# Patient Record
Sex: Female | Born: 1980 | Race: Black or African American | Hispanic: No | Marital: Single | Smoking: Current every day smoker
Health system: Southern US, Community
[De-identification: ages and names within clinical notes are randomized; demographics above are authoritative.]

## PROBLEM LIST (undated history)

## (undated) ENCOUNTER — Inpatient Hospital Stay (HOSPITAL_COMMUNITY): Payer: Self-pay

## (undated) DIAGNOSIS — J42 Unspecified chronic bronchitis: Secondary | ICD-10-CM

## (undated) DIAGNOSIS — M549 Dorsalgia, unspecified: Secondary | ICD-10-CM

## (undated) DIAGNOSIS — H9190 Unspecified hearing loss, unspecified ear: Secondary | ICD-10-CM

## (undated) DIAGNOSIS — R109 Unspecified abdominal pain: Secondary | ICD-10-CM

## (undated) DIAGNOSIS — N760 Acute vaginitis: Secondary | ICD-10-CM

## (undated) DIAGNOSIS — B9689 Other specified bacterial agents as the cause of diseases classified elsewhere: Secondary | ICD-10-CM

## (undated) DIAGNOSIS — O26892 Other specified pregnancy related conditions, second trimester: Secondary | ICD-10-CM

## (undated) HISTORY — PX: NO PAST SURGERIES: SHX2092

## (undated) HISTORY — DX: Unspecified hearing loss, unspecified ear: H91.90

---
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
## 2002-12-08 ENCOUNTER — Inpatient Hospital Stay (HOSPITAL_COMMUNITY): Admission: AD | Admit: 2002-12-08 | Discharge: 2002-12-08 | Payer: Self-pay | Admitting: *Deleted

## 2003-01-08 ENCOUNTER — Inpatient Hospital Stay (HOSPITAL_COMMUNITY): Admission: AD | Admit: 2003-01-08 | Discharge: 2003-01-08 | Payer: Self-pay | Admitting: Obstetrics and Gynecology

## 2003-02-22 ENCOUNTER — Ambulatory Visit (HOSPITAL_COMMUNITY): Admission: RE | Admit: 2003-02-22 | Discharge: 2003-02-22 | Payer: Self-pay | Admitting: *Deleted

## 2003-04-14 ENCOUNTER — Inpatient Hospital Stay (HOSPITAL_COMMUNITY): Admission: AD | Admit: 2003-04-14 | Discharge: 2003-04-14 | Payer: Self-pay | Admitting: Obstetrics & Gynecology

## 2003-06-09 ENCOUNTER — Ambulatory Visit (HOSPITAL_COMMUNITY): Admission: RE | Admit: 2003-06-09 | Discharge: 2003-06-09 | Payer: Self-pay | Admitting: *Deleted

## 2003-06-20 ENCOUNTER — Inpatient Hospital Stay (HOSPITAL_COMMUNITY): Admission: AD | Admit: 2003-06-20 | Discharge: 2003-06-20 | Payer: Self-pay | Admitting: Obstetrics & Gynecology

## 2003-07-22 ENCOUNTER — Inpatient Hospital Stay (HOSPITAL_COMMUNITY): Admission: AD | Admit: 2003-07-22 | Discharge: 2003-07-22 | Payer: Self-pay | Admitting: *Deleted

## 2003-07-23 ENCOUNTER — Inpatient Hospital Stay (HOSPITAL_COMMUNITY): Admission: AD | Admit: 2003-07-23 | Discharge: 2003-07-24 | Payer: Self-pay | Admitting: *Deleted

## 2003-07-25 ENCOUNTER — Ambulatory Visit (HOSPITAL_COMMUNITY): Admission: RE | Admit: 2003-07-25 | Discharge: 2003-07-25 | Payer: Self-pay | Admitting: *Deleted

## 2003-07-27 ENCOUNTER — Inpatient Hospital Stay (HOSPITAL_COMMUNITY): Admission: AD | Admit: 2003-07-27 | Discharge: 2003-07-30 | Payer: Self-pay | Admitting: Obstetrics and Gynecology

## 2004-02-09 ENCOUNTER — Emergency Department (HOSPITAL_COMMUNITY): Admission: EM | Admit: 2004-02-09 | Discharge: 2004-02-09 | Payer: Self-pay | Admitting: Family Medicine

## 2004-08-08 ENCOUNTER — Emergency Department (HOSPITAL_COMMUNITY): Admission: EM | Admit: 2004-08-08 | Discharge: 2004-08-08 | Payer: Self-pay | Admitting: Family Medicine

## 2004-10-08 ENCOUNTER — Inpatient Hospital Stay (HOSPITAL_COMMUNITY): Admission: AD | Admit: 2004-10-08 | Discharge: 2004-10-08 | Payer: Self-pay | Admitting: Obstetrics & Gynecology

## 2004-11-06 IMAGING — US US OB COMP +14 WK
1 series · 13 of 28 positions shown · non-contrast
Comparison: none

FINDINGS
CLINICAL DATA: G1 P0.  LMP 10/16/02.  ASSESS FETAL ANATOMY.
OBSTETRICAL ULTRASOUND
NUMBER OF FETUSES:  1
HEART RATE:  155 bpmMOVEMENT:    YESBREATHING:  NO
PRESENTATION:  CEPHALIC
PLACENTAL LOCATION:  ANTERIORGRADE:  IPREVIA:  NO
AMNIOTIC FLUID (SUBJECTIVE):  NORMAL
AMNIOTIC FLUID (OBJECTIVE):   6.1 cm VERTICAL POCKET
FETAL BIOMETRY
BPD:  4.2 cm18 W 5 D
HC:    15.5 cm18 W 3 D
AC:   13.2 cm18 W 5 D
FL:2.6 cm18 W 0 D
MEAN GA:18 W 3 D
LMP GA:18 W 3 D (ASSIGNED)
FETAL ANATOMY
LATERAL VENTRICLES:VISUALIZED
THALAMI/CSP:     VISUALIZED
POSTERIOR FOSSA:     VISUALIZED
NUCHAL REGION:           VISUALIZED
SPINE:     VISUALIZED
4 CHAMBER HEART ON LEFT:VISUALIZED
STOMACH ON LEFT:VISUALIZED
3 VESSEL CORD:     VISUALIZED
CORD INSERTION SITE:VISUALIZED
KIDNEYS:     VISUALIZED
BLADDER:     VISUALIZED
EXTREMITIES:                        VISUALIZED
ADDITIONAL ANATOMY VISUALIZED:  LVOT, RVOT, UPPER LIP, ORBITS, PROFILE, DIAPHRAGM, HEEL, 5th DIGIT,
DUCTAL ARCH, AORTIC ARCH, MALE GENITALIA.
COMMENT:  THERE IS MILD FETAL RENAL PYELECTASIS WITH THE  RIGHT RENAL PELVIS MEASURING 3.7 MM AND
LEFT RENAL PELVIS MEASURING 4.1 MM.  SUGGEST FOLLOW-UP STUDY LATER IN PREGNANCY.
MATERNAL FINDINGS
CERVIX:    3.3 cm TRANSABDOMINALLY
IMPRESSION
1.  SINGLE LIVING INTRAUTERINE FETUS IN CEPHALIC PRESENTATION.  PATIENT IS 18 WEEKS 3 DAYS BY LMP
DATING AND MEASURES 18 WEEKS 3 DAYS TODAY INDICATING APPROPRIATE GROWTH.
2.  THERE IS MILD FETAL RENAL PYELECTASIS IN THIS MALE FETUS.  SUGGEST FOLLOW-UP STUDY LATER IN
PREGNANCY.  NO OTHER ANATOMIC ABNORMALITIES IDENTIFIED.

[Series 1: unknown · 0.32mm/px · 13 of 64 slices shown]
[im 3/64]
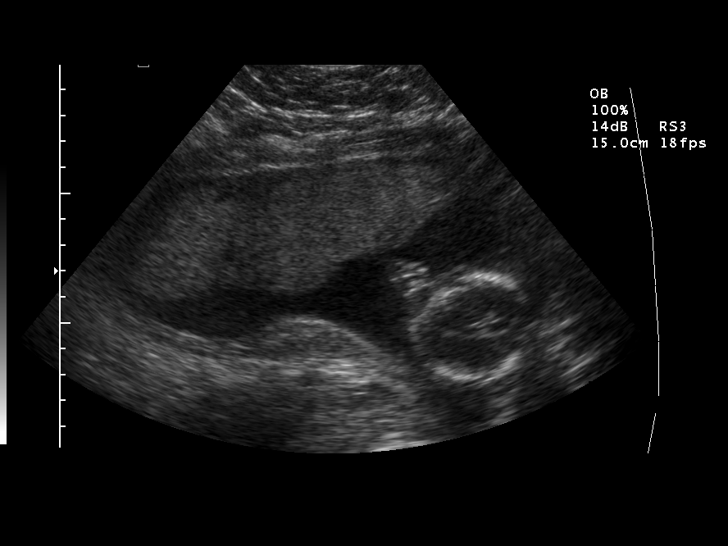
[im 8/64]
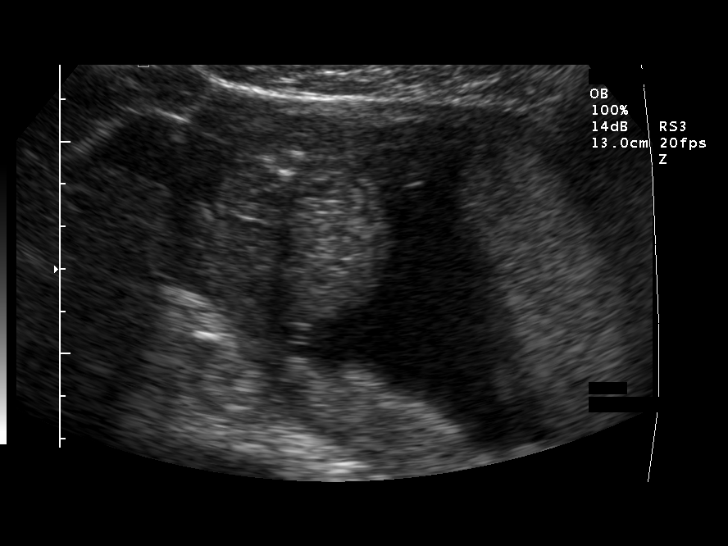
[im 12/64]
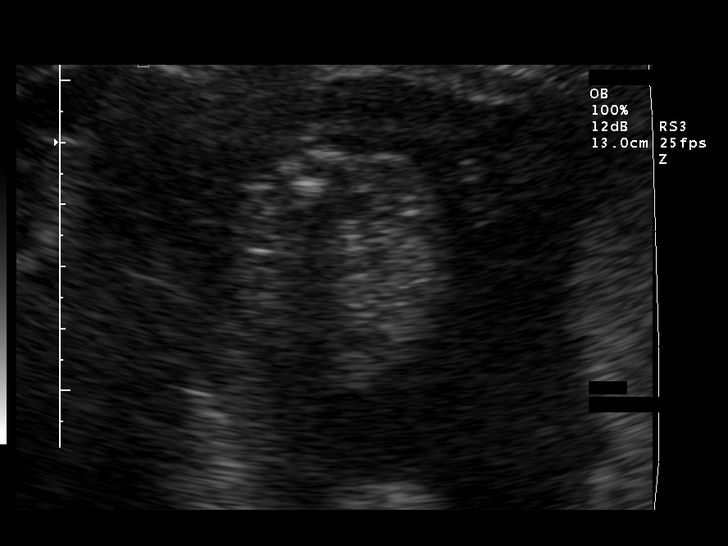
[im 17/64]
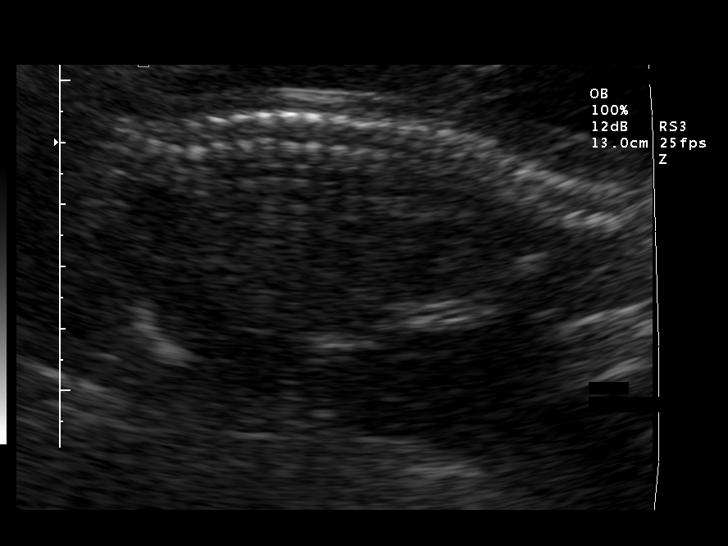
[im 22/64]
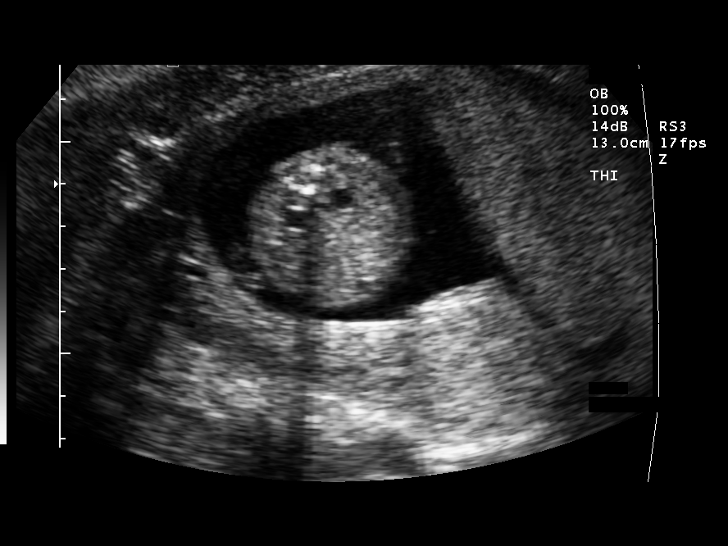
[im 26/64]
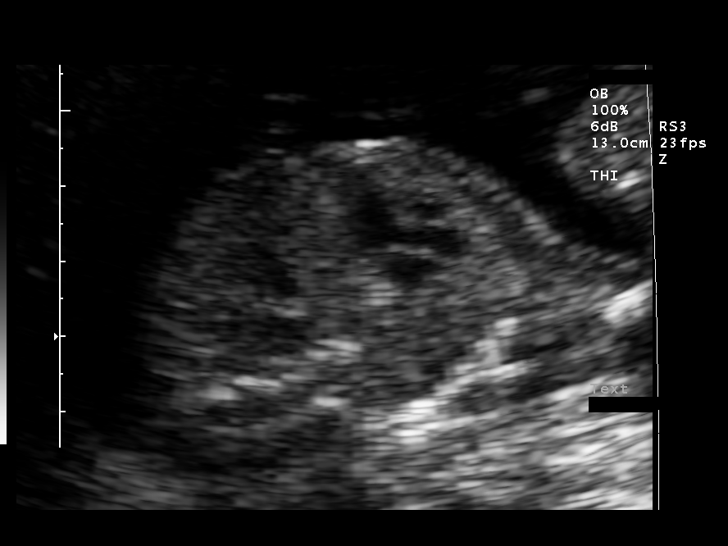
[im 33/64]
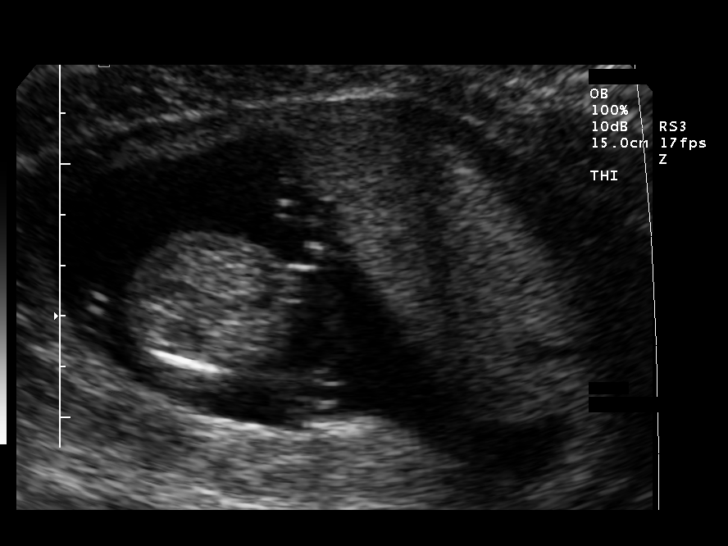
[im 38/64]
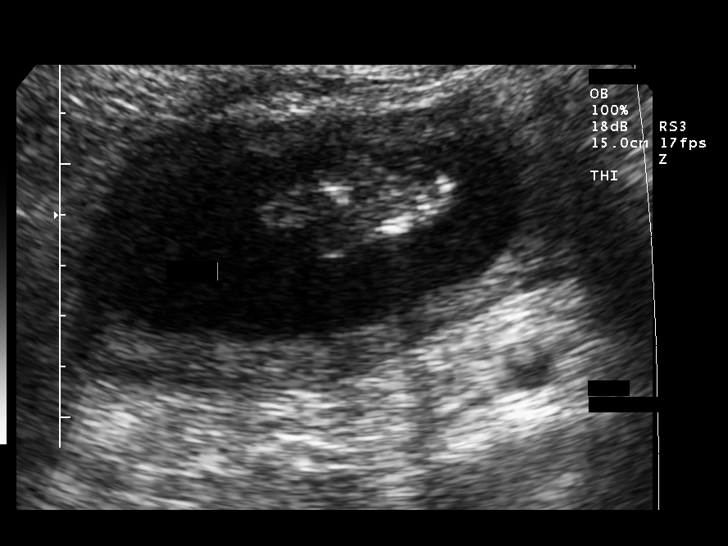
[im 43/64]
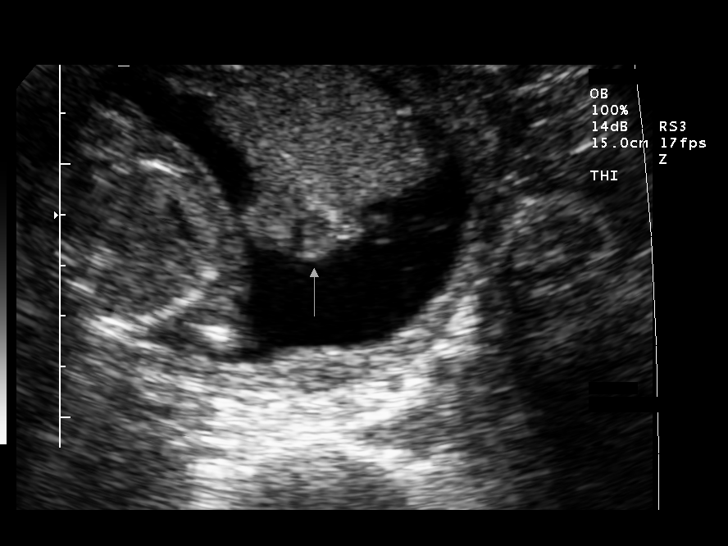
[im 47/64]
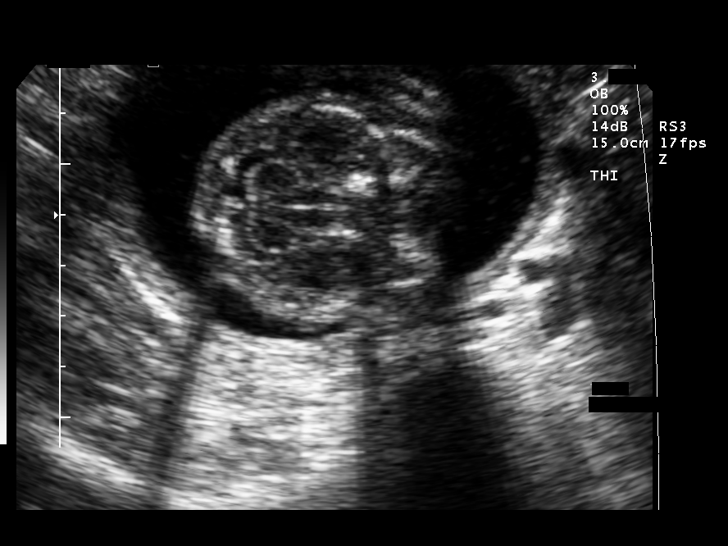
[im 52/64]
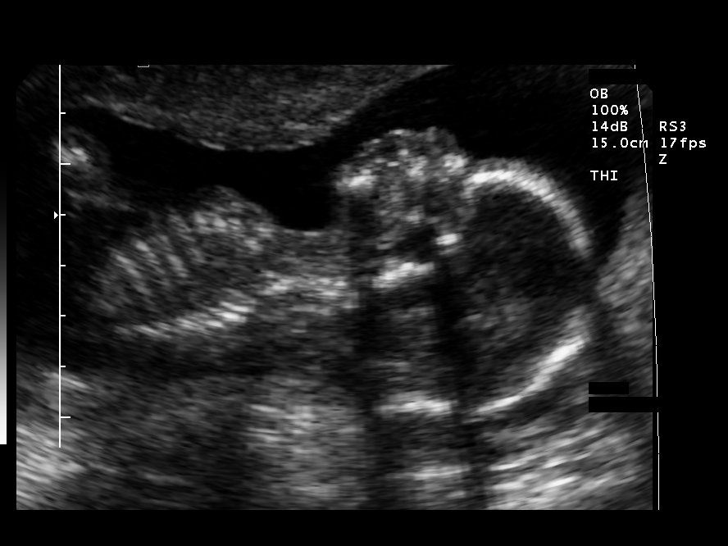
[im 57/64]
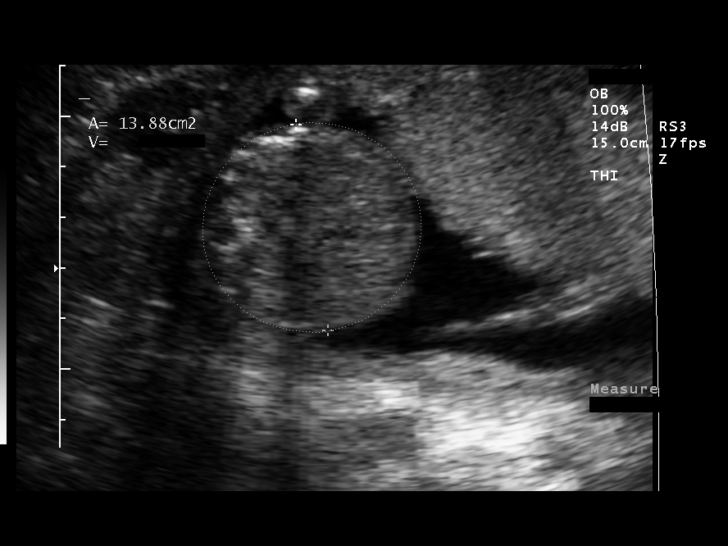
[im 61/64]
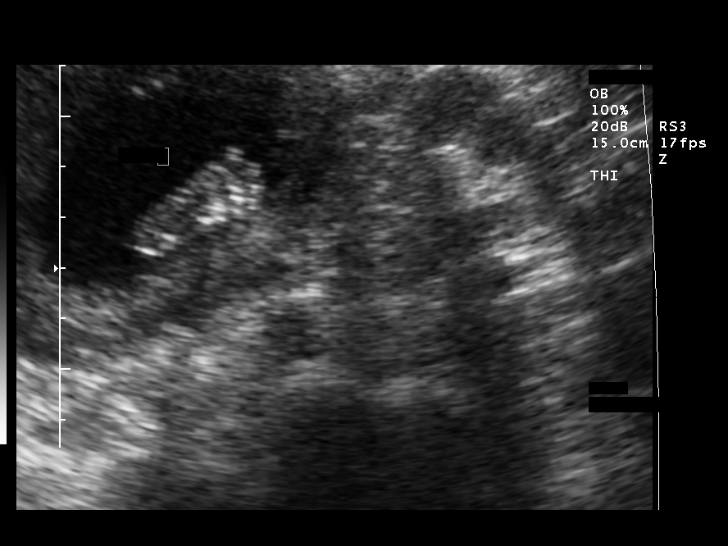

[13 of 28 positions shown; findings below may reference images not displayed]

## 2004-11-13 ENCOUNTER — Emergency Department (HOSPITAL_COMMUNITY): Admission: EM | Admit: 2004-11-13 | Discharge: 2004-11-13 | Payer: Self-pay | Admitting: Emergency Medicine

## 2004-11-18 ENCOUNTER — Emergency Department (HOSPITAL_COMMUNITY): Admission: EM | Admit: 2004-11-18 | Discharge: 2004-11-18 | Payer: Self-pay | Admitting: *Deleted

## 2004-12-20 ENCOUNTER — Inpatient Hospital Stay (HOSPITAL_COMMUNITY): Admission: AD | Admit: 2004-12-20 | Discharge: 2004-12-20 | Payer: Self-pay | Admitting: Obstetrics & Gynecology

## 2005-01-29 ENCOUNTER — Inpatient Hospital Stay (HOSPITAL_COMMUNITY): Admission: AD | Admit: 2005-01-29 | Discharge: 2005-01-29 | Payer: Self-pay | Admitting: *Deleted

## 2005-02-13 ENCOUNTER — Inpatient Hospital Stay (HOSPITAL_COMMUNITY): Admission: AD | Admit: 2005-02-13 | Discharge: 2005-02-13 | Payer: Self-pay | Admitting: Obstetrics and Gynecology

## 2005-05-22 ENCOUNTER — Inpatient Hospital Stay (HOSPITAL_COMMUNITY): Admission: AD | Admit: 2005-05-22 | Discharge: 2005-05-22 | Payer: Self-pay | Admitting: Obstetrics & Gynecology

## 2005-09-30 ENCOUNTER — Inpatient Hospital Stay (HOSPITAL_COMMUNITY): Admission: AD | Admit: 2005-09-30 | Discharge: 2005-10-01 | Payer: Self-pay | Admitting: Obstetrics

## 2005-10-03 ENCOUNTER — Inpatient Hospital Stay (HOSPITAL_COMMUNITY): Admission: AD | Admit: 2005-10-03 | Discharge: 2005-10-06 | Payer: Self-pay | Admitting: Obstetrics & Gynecology

## 2006-03-11 ENCOUNTER — Emergency Department (HOSPITAL_COMMUNITY): Admission: EM | Admit: 2006-03-11 | Discharge: 2006-03-11 | Payer: Self-pay | Admitting: Emergency Medicine

## 2006-07-29 ENCOUNTER — Emergency Department (HOSPITAL_COMMUNITY): Admission: EM | Admit: 2006-07-29 | Discharge: 2006-07-29 | Payer: Self-pay | Admitting: Emergency Medicine

## 2007-02-10 ENCOUNTER — Emergency Department (HOSPITAL_COMMUNITY): Admission: EM | Admit: 2007-02-10 | Discharge: 2007-02-10 | Payer: Self-pay | Admitting: Emergency Medicine

## 2007-06-09 ENCOUNTER — Emergency Department (HOSPITAL_COMMUNITY): Admission: EM | Admit: 2007-06-09 | Discharge: 2007-06-09 | Payer: Self-pay | Admitting: Emergency Medicine

## 2007-09-15 ENCOUNTER — Emergency Department (HOSPITAL_COMMUNITY): Admission: EM | Admit: 2007-09-15 | Discharge: 2007-09-15 | Payer: Self-pay | Admitting: Emergency Medicine

## 2007-09-21 ENCOUNTER — Emergency Department (HOSPITAL_COMMUNITY): Admission: EM | Admit: 2007-09-21 | Discharge: 2007-09-22 | Payer: Self-pay | Admitting: Emergency Medicine

## 2008-03-23 ENCOUNTER — Emergency Department (HOSPITAL_COMMUNITY): Admission: EM | Admit: 2008-03-23 | Discharge: 2008-03-23 | Payer: Self-pay | Admitting: Family Medicine

## 2010-05-18 ENCOUNTER — Emergency Department (HOSPITAL_COMMUNITY)
Admission: EM | Admit: 2010-05-18 | Discharge: 2010-05-18 | Payer: Self-pay | Source: Home / Self Care | Admitting: Emergency Medicine

## 2010-07-14 ENCOUNTER — Emergency Department (HOSPITAL_COMMUNITY): Payer: No Typology Code available for payment source

## 2010-07-14 ENCOUNTER — Emergency Department (HOSPITAL_COMMUNITY)
Admission: EM | Admit: 2010-07-14 | Discharge: 2010-07-14 | Disposition: A | Discharge status: Home / Self Care | Payer: No Typology Code available for payment source | Attending: Emergency Medicine | Admitting: Emergency Medicine

## 2010-07-14 DIAGNOSIS — M542 Cervicalgia: Secondary | ICD-10-CM | POA: Insufficient documentation

## 2011-03-05 LAB — BASIC METABOLIC PANEL
BUN: 6
CO2: 21
Calcium: 8.8
Creatinine, Ser: 0.89
GFR calc Af Amer: >60
Glucose, Bld: 103 — ABNORMAL HIGH

## 2011-03-05 LAB — CBC
MCHC: 34.8
MCV: 89.6
Platelets: 145 — ABNORMAL LOW
RDW: 11.9

## 2011-03-05 LAB — URINALYSIS, ROUTINE W REFLEX MICROSCOPIC
Bilirubin Urine: NEGATIVE
Hgb urine dipstick: NEGATIVE
Protein, ur: NEGATIVE
Urobilinogen, UA: 1

## 2011-03-05 LAB — HEPATIC FUNCTION PANEL
Albumin: 3.3 — ABNORMAL LOW
Indirect Bilirubin: 0.8
Total Protein: 6.9

## 2011-03-05 LAB — LIPASE, BLOOD: Lipase: 54

## 2011-12-20 ENCOUNTER — Emergency Department (HOSPITAL_COMMUNITY)
Admission: EM | Admit: 2011-12-20 | Discharge: 2011-12-20 | Disposition: A | Discharge status: Home / Self Care | Payer: Medicaid Other | Attending: Emergency Medicine | Admitting: Emergency Medicine

## 2011-12-20 ENCOUNTER — Emergency Department (HOSPITAL_COMMUNITY): Payer: Medicaid Other

## 2011-12-20 ENCOUNTER — Encounter (HOSPITAL_COMMUNITY): Payer: Self-pay | Admitting: Emergency Medicine

## 2011-12-20 DIAGNOSIS — M545 Low back pain, unspecified: Secondary | ICD-10-CM | POA: Insufficient documentation

## 2011-12-20 DIAGNOSIS — R079 Chest pain, unspecified: Secondary | ICD-10-CM | POA: Insufficient documentation

## 2011-12-20 LAB — URINALYSIS, ROUTINE W REFLEX MICROSCOPIC
Nitrite: NEGATIVE
Specific Gravity, Urine: 1.033 — ABNORMAL HIGH (ref 1.005–1.030)
Urobilinogen, UA: 1 mg/dL (ref 0.0–1.0)
pH: 6 (ref 5.0–8.0)

## 2011-12-20 LAB — URINE MICROSCOPIC-ADD ON

## 2011-12-20 MED ORDER — IBUPROFEN 800 MG PO TABS
800.0000 mg | ORAL_TABLET | Freq: Once | ORAL | Status: AC
  Administered 2011-12-20: 800 mg via ORAL
  Filled 2011-12-20: qty 1
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
## 2011-12-21 LAB — URINE CULTURE: Colony Count: 95000

## 2012-09-19 ENCOUNTER — Emergency Department (HOSPITAL_COMMUNITY)
Admission: EM | Admit: 2012-09-19 | Discharge: 2012-09-19 | Disposition: A | Discharge status: Home / Self Care | Payer: Medicaid Other | Attending: Emergency Medicine | Admitting: Emergency Medicine

## 2012-09-19 ENCOUNTER — Encounter (HOSPITAL_COMMUNITY): Payer: Self-pay | Admitting: Emergency Medicine

## 2012-09-19 DIAGNOSIS — J42 Unspecified chronic bronchitis: Secondary | ICD-10-CM | POA: Insufficient documentation

## 2012-09-19 DIAGNOSIS — R112 Nausea with vomiting, unspecified: Secondary | ICD-10-CM | POA: Insufficient documentation

## 2012-09-19 DIAGNOSIS — F17200 Nicotine dependence, unspecified, uncomplicated: Secondary | ICD-10-CM | POA: Insufficient documentation

## 2012-09-19 DIAGNOSIS — R1084 Generalized abdominal pain: Secondary | ICD-10-CM | POA: Insufficient documentation

## 2012-09-19 DIAGNOSIS — Z8709 Personal history of other diseases of the respiratory system: Secondary | ICD-10-CM | POA: Insufficient documentation

## 2012-09-19 DIAGNOSIS — R197 Diarrhea, unspecified: Secondary | ICD-10-CM

## 2012-09-19 DIAGNOSIS — Z3202 Encounter for pregnancy test, result negative: Secondary | ICD-10-CM | POA: Insufficient documentation

## 2012-09-19 HISTORY — DX: Unspecified chronic bronchitis: J42

## 2012-09-19 LAB — COMPREHENSIVE METABOLIC PANEL
ALT: 13 U/L (ref 0–35)
BUN: 10 mg/dL (ref 6–23)
CO2: 25 mEq/L (ref 19–32)
Calcium: 8.9 mg/dL (ref 8.4–10.5)
GFR calc Af Amer: >90 mL/min (ref >90)
GFR calc non Af Amer: >90 mL/min (ref >90)
Glucose, Bld: 102 mg/dL — ABNORMAL HIGH (ref 70–99)
Total Protein: 6.7 g/dL (ref 6.0–8.3)

## 2012-09-19 LAB — URINALYSIS, ROUTINE W REFLEX MICROSCOPIC
Bilirubin Urine: NEGATIVE
Glucose, UA: NEGATIVE mg/dL
Hgb urine dipstick: NEGATIVE
Ketones, ur: NEGATIVE mg/dL
Protein, ur: NEGATIVE mg/dL
Urobilinogen, UA: 0.2 mg/dL (ref 0.0–1.0)
pH: 7 (ref 5.0–8.0)

## 2012-09-19 LAB — URINE MICROSCOPIC-ADD ON

## 2012-09-19 LAB — CBC
HCT: 40.5 % (ref 36.0–46.0)
Hemoglobin: 14.1 g/dL (ref 12.0–15.0)
MCH: 31.6 pg (ref 26.0–34.0)
MCHC: 34.8 g/dL (ref 30.0–36.0)
MCV: 90.8 fL (ref 78.0–100.0)
RBC: 4.46 MIL/uL (ref 3.87–5.11)

## 2012-09-19 LAB — LIPASE, BLOOD: Lipase: 24 U/L (ref 11–59)

## 2012-09-19 MED ORDER — SODIUM CHLORIDE 0.9 % IV BOLUS (SEPSIS)
1000.0000 mL | Freq: Once | INTRAVENOUS | Status: AC
  Administered 2012-09-19: 1000 mL via INTRAVENOUS

## 2012-09-19 MED ORDER — ONDANSETRON HCL 4 MG PO TABS: 4.0000 mg | ORAL_TABLET | Freq: Four times a day (QID) | ORAL | Status: DC

## 2012-09-19 NOTE — ED Notes (Addendum)
Pt states she had hamburger patties last night and this morning began vomiting along with diarrhea. Pt also states she is having left side abdominal pain. Pt states she has vomited 15x in the past 24hrs. EMS administered of fentanyl, 50mg  of benadryl, 4mg  of Zofran in route. Pt states medication was effective.

## 2012-09-19 NOTE — ED Provider Notes (Signed)
History     CSN: [id number]  Arrival date & time 09/19/12  1826   First MD Initiated Contact with Patient 09/19/12 1850      Chief Complaint  Patient presents with  . Abdominal Pain  . Nausea  . Emesis  . Diarrhea    (Consider location/radiation/quality/duration/timing/severity/associated sxs/prior treatment) HPI Pt presenting with acute onset of vomiting and diarrhea.  States she was at a meeting earlier today and began to feel sick, then began to have vomiting and diarrhea.  C/o diffuse abdominal pain.  States she has had emesis x more than 15.  Emesis nonbloody and nonbilious, multiple episodes of diarrhea- watery. No blood in stool.  No fever/chills.  Pt received zofran en route via EMS and feels improved upon arrival.  There are no other associated systemic symptoms, there are no other alleviating or modifying factors.   Past Medical History  Diagnosis Date  . Chronic bronchitis     History reviewed. No pertinent past surgical history.  History reviewed. No pertinent family history.  History  Substance Use Topics  . Smoking status: Current Every Day Smoker    Types: Cigarettes  . Smokeless tobacco: Current User  . Alcohol Use: Yes     Comment: occasionally    OB History   Grav Para Term Preterm Abortions TAB SAB Ect Mult Living                  Review of Systems ROS reviewed and all otherwise negative except for mentioned in HPI  Allergies  Review of patient's allergies indicates no known allergies.  Home Medications   Current Outpatient Rx  Name  Route  Sig  Dispense  Refill  . acetaminophen (TYLENOL) 500 MG tablet   Oral   Take 1,000 mg by mouth every 6 (six) hours as needed. For pain         . promethazine (PHENERGAN) 25 MG tablet   Oral   Take 25 mg by mouth every 6 (six) hours as needed for nausea.         . ondansetron (ZOFRAN) 4 MG tablet   Oral   Take 1 tablet (4 mg total) by mouth every 6 (six) hours.   12 tablet   0     BP  111/69  Pulse 80  Temp(Src) 98.1 F (36.7 C) (Oral)  Resp 16  SpO2 99%  LMP 09/02/2012 Vitals reviewed Physical Exam Physical Examination: General appearance - alert, well appearing, and in no distress Mental status - alert, oriented to person, place, and time Eyes - no scleral icterus, no conjunctival injection Mouth - mucous membranes moist, pharynx normal without lesions Chest - clear to auscultation, no wheezes, rales or rhonchi, symmetric air entry Heart - normal rate, regular rhythm, normal S1, S2, no murmurs, rubs, clicks or gallops Abdomen - soft, nontender, nondistended, no masses or organomegaly, nabs Extremities - peripheral pulses normal, no pedal edema, no clubbing or cyanosis Skin - normal coloration and turgor, no rashes  ED Course  Procedures (including critical care time)  Labs Reviewed  URINALYSIS, ROUTINE W REFLEX MICROSCOPIC - Abnormal; Notable for the following:    APPearance CLOUDY (*)    Specific Gravity, Urine 1.036 (*)    Leukocytes, UA TRACE (*)    All other components within normal limits  CBC - Abnormal; Notable for the following:    WBC 12.8 (*)    All other components within normal limits  COMPREHENSIVE METABOLIC PANEL - Abnormal; Notable for the following:  Glucose, Bld 102 (*)    All other components within normal limits  PREGNANCY, URINE  LIPASE, BLOOD  URINE MICROSCOPIC-ADD ON   No results found.   1. Nausea vomiting and diarrhea       MDM  Pt presenting with c/o nausea, vomiting and diarrhea.  Pt has benign abdominal exam, feels improved after meds.  Feels much improved and has tolerated po fluids in the ED.  Suspect viral gastroenteritis.  Discharged with strict return precautions.  Pt agreeable with plan.        Ethelda Chick, MD 09/19/12 [phone]

## 2012-09-19 NOTE — ED Notes (Signed)
Pt tolerated fluid challenge well. 

## 2012-09-19 NOTE — Discharge Instructions (Signed)
Return to the ED with any concerns including vomiting and not able to keep down liquids, worsening abdominal pain, fever, decreased level of alertness/lethargy, or any other alarming symptoms

## 2012-09-19 NOTE — ED Notes (Signed)
WGN:[id number]<HY> Expected date:09/19/12<BR> Expected time: 6:20 PM<BR> Means of arrival:Ambulance<BR> Comments:<BR> Abd pain, frequent urination

## 2013-02-24 ENCOUNTER — Emergency Department (HOSPITAL_COMMUNITY)
Admission: EM | Admit: 2013-02-24 | Discharge: 2013-02-24 | Discharge status: Against Medical Advice | Payer: Medicaid Other | Source: Home / Self Care

## 2013-02-24 ENCOUNTER — Encounter (HOSPITAL_COMMUNITY): Payer: Self-pay | Admitting: Emergency Medicine

## 2013-02-24 ENCOUNTER — Encounter (HOSPITAL_COMMUNITY): Payer: Self-pay | Admitting: *Deleted

## 2013-02-24 ENCOUNTER — Emergency Department (HOSPITAL_COMMUNITY)
Admission: EM | Admit: 2013-02-24 | Discharge: 2013-02-24 | Disposition: A | Discharge status: Home / Self Care | Payer: Medicaid Other | Attending: Emergency Medicine | Admitting: Emergency Medicine

## 2013-02-24 DIAGNOSIS — F17200 Nicotine dependence, unspecified, uncomplicated: Secondary | ICD-10-CM | POA: Insufficient documentation

## 2013-02-24 DIAGNOSIS — E669 Obesity, unspecified: Secondary | ICD-10-CM | POA: Insufficient documentation

## 2013-02-24 DIAGNOSIS — R112 Nausea with vomiting, unspecified: Secondary | ICD-10-CM | POA: Insufficient documentation

## 2013-02-24 DIAGNOSIS — R51 Headache: Secondary | ICD-10-CM | POA: Insufficient documentation

## 2013-02-24 DIAGNOSIS — Z79899 Other long term (current) drug therapy: Secondary | ICD-10-CM | POA: Insufficient documentation

## 2013-02-24 DIAGNOSIS — Z3202 Encounter for pregnancy test, result negative: Secondary | ICD-10-CM | POA: Insufficient documentation

## 2013-02-24 DIAGNOSIS — R300 Dysuria: Secondary | ICD-10-CM | POA: Insufficient documentation

## 2013-02-24 DIAGNOSIS — A5900 Urogenital trichomoniasis, unspecified: Secondary | ICD-10-CM | POA: Insufficient documentation

## 2013-02-24 DIAGNOSIS — N390 Urinary tract infection, site not specified: Secondary | ICD-10-CM

## 2013-02-24 DIAGNOSIS — A599 Trichomoniasis, unspecified: Secondary | ICD-10-CM

## 2013-02-24 LAB — COMPREHENSIVE METABOLIC PANEL
CO2: 22 mEq/L (ref 19–32)
Calcium: 9.7 mg/dL (ref 8.4–10.5)
Creatinine, Ser: 0.79 mg/dL (ref 0.50–1.10)
GFR calc Af Amer: >90 mL/min (ref >90)
GFR calc non Af Amer: >90 mL/min (ref >90)
Glucose, Bld: 97 mg/dL (ref 70–99)
Total Protein: 7.7 g/dL (ref 6.0–8.3)

## 2013-02-24 LAB — CBC WITH DIFFERENTIAL/PLATELET
Eosinophils Absolute: 0.2 10*3/uL (ref 0.0–0.7)
Eosinophils Relative: 1 % (ref 0–5)
HCT: 42.2 % (ref 36.0–46.0)
Lymphocytes Relative: 17 % (ref 12–46)
Lymphs Abs: 2.3 10*3/uL (ref 0.7–4.0)
MCH: 32.4 pg (ref 26.0–34.0)
MCV: 90.6 fL (ref 78.0–100.0)
Monocytes Absolute: 1 10*3/uL (ref 0.1–1.0)
Platelets: 233 10*3/uL (ref 150–400)
RBC: 4.66 MIL/uL (ref 3.87–5.11)
RDW: 12.8 % (ref 11.5–15.5)
WBC: 13.8 10*3/uL — ABNORMAL HIGH (ref 4.0–10.5)

## 2013-02-24 LAB — URINALYSIS, ROUTINE W REFLEX MICROSCOPIC
Nitrite: NEGATIVE
Specific Gravity, Urine: 1.017 (ref 1.005–1.030)
Urobilinogen, UA: 1 mg/dL (ref 0.0–1.0)
pH: 7 (ref 5.0–8.0)

## 2013-02-24 LAB — URINE MICROSCOPIC-ADD ON

## 2013-02-24 LAB — POCT PREGNANCY, URINE: Preg Test, Ur: NEGATIVE

## 2013-02-24 LAB — LIPASE, BLOOD: Lipase: 21 U/L (ref 11–59)

## 2013-02-24 MED ORDER — ONDANSETRON 8 MG PO TBDP: 8.0000 mg | ORAL_TABLET | Freq: Three times a day (TID) | ORAL | Status: DC | PRN

## 2013-02-24 MED ORDER — SODIUM CHLORIDE 0.9 % IV BOLUS (SEPSIS)
1000.0000 mL | Freq: Once | INTRAVENOUS | Status: AC
  Administered 2013-02-24: 1000 mL via INTRAVENOUS

## 2013-02-24 MED ORDER — DEXTROSE 5 % IV SOLN
1.0000 g | INTRAVENOUS | Status: DC
  Administered 2013-02-24: 1 g via INTRAVENOUS
  Filled 2013-02-24: qty 10

## 2013-02-24 MED ORDER — CEPHALEXIN 500 MG PO CAPS: 500.0000 mg | ORAL_CAPSULE | Freq: Four times a day (QID) | ORAL | Status: DC

## 2013-02-24 MED ORDER — ONDANSETRON HCL 4 MG/2ML IJ SOLN
4.0000 mg | Freq: Once | INTRAMUSCULAR | Status: AC
  Administered 2013-02-24: 4 mg via INTRAVENOUS
  Filled 2013-02-24: qty 2

## 2013-02-24 MED ORDER — METRONIDAZOLE 500 MG PO TABS
2000.0000 mg | ORAL_TABLET | Freq: Once | ORAL | Status: AC
  Administered 2013-02-24: 2000 mg via ORAL
  Filled 2013-02-24: qty 4

## 2013-02-24 MED ORDER — MORPHINE SULFATE 4 MG/ML IJ SOLN
4.0000 mg | Freq: Once | INTRAMUSCULAR | Status: AC
  Administered 2013-02-24: 4 mg via INTRAVENOUS
  Filled 2013-02-24: qty 1

## 2013-02-24 MED ORDER — HYDROCODONE-ACETAMINOPHEN 5-325 MG PO TABS: 1.0000 | ORAL_TABLET | Freq: Four times a day (QID) | ORAL | Status: DC | PRN

## 2013-02-24 NOTE — ED Notes (Addendum)
Pt reports right sided flank pain that began 3-4 days ago. Pt reports a personal history of kidney stones. Pt also reports a headache with nausea and light sensitivity. Pt is A/O x4 and in NAD. Pt was seen at [facility] earlier today and had labs and urine collected, however left prior to being placed in a room due to extended wait times.

## 2013-02-24 NOTE — ED Notes (Signed)
The pt is c/o a headache earache rt lat abd pain lump on her rt flank area .  All pain is worse with breathing crying in triage  Sore throat also.  lmp 2 days ago

## 2013-02-24 NOTE — ED Provider Notes (Signed)
CSN: [id number]     Arrival date & time 02/24/13  1856 History   First MD Initiated Contact with Patient 02/24/13 2052     Chief Complaint  Patient presents with  . Headache  . Flank Pain    HPI Pt has had pain in the right flank for the last few days.  The pain is increased in severity. Today she started feeling sick with nausea and vomiting.  She has had some burning with urination.  No diarrhea.  SHe also has had a headache and has tried ibuprofen but it has not helped.  No history of medical problems.  No history of prior surgery.  Past Medical History  Diagnosis Date  . Chronic bronchitis    History reviewed. No pertinent past surgical history. No family history on file. History  Substance Use Topics  . Smoking status: Current Every Day Smoker    Types: Cigarettes  . Smokeless tobacco: Current User  . Alcohol Use: Yes     Comment: occasionally   OB History   Grav Para Term Preterm Abortions TAB SAB Ect Mult Living                 Review of Systems  Allergies  Review of patient's allergies indicates no known allergies.  Home Medications   Current Outpatient Rx  Name  Route  Sig  Dispense  Refill  . etonogestrel (IMPLANON) 68 MG IMPL implant   Subcutaneous   Inject 1 each into the skin once. 2011         . ibuprofen (ADVIL,MOTRIN) 200 MG tablet   Oral   Take 600 mg by mouth every 6 (six) hours as needed for pain.         . cephALEXin (KEFLEX) 500 MG capsule   Oral   Take 1 capsule (500 mg total) by mouth 4 (four) times daily.   28 capsule   0   . HYDROcodone-acetaminophen (NORCO) 5-325 MG per tablet   Oral   Take 1-2 tablets by mouth every 6 (six) hours as needed for pain.   16 tablet   0   . ondansetron (ZOFRAN ODT) 8 MG disintegrating tablet   Oral   Take 1 tablet (8 mg total) by mouth every 8 (eight) hours as needed for nausea.   20 tablet   0    BP 115/87  Pulse 87  Temp(Src) 100 F (37.8 C) (Oral)  Resp 20  Ht 5\' 3"  (1.6 m)  Wt 205  lb (92.987 kg)  BMI 36.32 kg/m2  SpO2 100%  LMP 02/22/2013 Physical Exam  Nursing note and vitals reviewed. Constitutional: She appears well-developed and well-nourished. No distress.  Obese  HENT:  Head: Normocephalic and atraumatic.  Right Ear: External ear normal.  Left Ear: External ear normal.  Eyes: Conjunctivae are normal. Right eye exhibits no discharge. Left eye exhibits no discharge. No scleral icterus.  Neck: Neck supple. No tracheal deviation present.  Cardiovascular: Normal rate, regular rhythm and intact distal pulses.   Pulmonary/Chest: Effort normal and breath sounds normal. No stridor. No respiratory distress. She has no wheezes. She has no rales.  Abdominal: Soft. Bowel sounds are normal. She exhibits no distension. There is tenderness in the right upper quadrant and epigastric area. There is CVA tenderness (right side). There is no rebound and no guarding.  Musculoskeletal: She exhibits no edema and no tenderness.  Neurological: She is alert. She has normal strength. No sensory deficit. Cranial nerve deficit:  no gross defecits  noted. She exhibits normal muscle tone. She displays no seizure activity. Coordination normal.  Skin: Skin is warm and dry. No rash noted.  Psychiatric: She has a normal mood and affect.    ED Course  Procedures (including critical care time) Labs Review Labs Reviewed  LIPASE, BLOOD   Recent Results (from the past 2160 hour(s))  CBC WITH DIFFERENTIAL     Status: Abnormal   Collection Time    02/24/13  5:04 PM      Result Value Range   WBC 13.8 (*) 4.0 - 10.5 K/uL   RBC 4.66  3.87 - 5.11 MIL/uL   Hemoglobin 15.1 (*) 12.0 - 15.0 g/dL   HCT 29.5  28.4 - 13.2 %   MCV 90.6  78.0 - 100.0 fL   MCH 32.4  26.0 - 34.0 pg   MCHC 35.8  30.0 - 36.0 g/dL   RDW 44.0  10.2 - 72.5 %   Platelets 233  150 - 400 K/uL   Neutrophils Relative % 74  43 - 77 %   Neutro Abs 10.3 (*) 1.7 - 7.7 K/uL   Lymphocytes Relative 17  12 - 46 %   Lymphs Abs 2.3   0.7 - 4.0 K/uL   Monocytes Relative 7  3 - 12 %   Monocytes Absolute 1.0  0.1 - 1.0 K/uL   Eosinophils Relative 1  0 - 5 %   Eosinophils Absolute 0.2  0.0 - 0.7 K/uL   Basophils Relative 0  0 - 1 %   Basophils Absolute 0.0  0.0 - 0.1 K/uL  COMPREHENSIVE METABOLIC PANEL     Status: None   Collection Time    02/24/13  5:04 PM      Result Value Range   Sodium 137  135 - 145 mEq/L   Potassium 3.6  3.5 - 5.1 mEq/L   Chloride 102  96 - 112 mEq/L   CO2 22  19 - 32 mEq/L   Glucose, Bld 97  70 - 99 mg/dL   BUN 9  6 - 23 mg/dL   Creatinine, Ser 3.66  0.50 - 1.10 mg/dL   Calcium 9.7  8.4 - 44.0 mg/dL   Total Protein 7.7  6.0 - 8.3 g/dL   Albumin 4.2  3.5 - 5.2 g/dL   AST 18  0 - 37 U/L   ALT 16  0 - 35 U/L   Alkaline Phosphatase 76  39 - 117 U/L   Total Bilirubin 0.6  0.3 - 1.2 mg/dL   GFR calc non Af Amer >90  >90 mL/min   GFR calc Af Amer >90  >90 mL/min   Comment: (NOTE)     The eGFR has been calculated using the CKD EPI equation.     This calculation has not been validated in all clinical situations.     eGFR's persistently <90 mL/min signify possible Chronic Kidney     Disease.  URINALYSIS, ROUTINE W REFLEX MICROSCOPIC     Status: Abnormal   Collection Time    02/24/13  5:09 PM      Result Value Range   Color, Urine YELLOW  YELLOW   APPearance CLOUDY (*) CLEAR   Specific Gravity, Urine 1.017  1.005 - 1.030   pH 7.0  5.0 - 8.0   Glucose, UA NEGATIVE  NEGATIVE mg/dL   Hgb urine dipstick SMALL (*) NEGATIVE   Bilirubin Urine NEGATIVE  NEGATIVE   Ketones, ur NEGATIVE  NEGATIVE mg/dL   Protein, ur NEGATIVE  NEGATIVE mg/dL   Urobilinogen, UA 1.0  0.0 - 1.0 mg/dL   Nitrite NEGATIVE  NEGATIVE   Leukocytes, UA MODERATE (*) NEGATIVE  URINE MICROSCOPIC-ADD ON     Status: Abnormal   Collection Time    02/24/13  5:09 PM      Result Value Range   Squamous Epithelial / LPF MANY (*) RARE   WBC, UA 7-10  <3 WBC/hpf   RBC / HPF 3-6  <3 RBC/hpf   Bacteria, UA FEW (*) RARE   Urine-Other  TRICHOMONAS PRESENT    POCT PREGNANCY, URINE     Status: None   Collection Time    02/24/13  5:21 PM      Result Value Range   Preg Test, Ur NEGATIVE  NEGATIVE   Comment:            THE SENSITIVITY OF THIS     METHODOLOGY IS >24 mIU/mL  LIPASE, BLOOD     Status: None   Collection Time    02/24/13  9:17 PM      Result Value Range   Lipase 21  11 - 59 U/L    Imaging Review No results found. Medications  cefTRIAXone (ROCEPHIN) 1 g in dextrose 5 % 50 mL IVPB (1 g Intravenous New Bag/Given 02/24/13 2124)  metroNIDAZOLE (FLAGYL) tablet 2,000 mg (not administered)  sodium chloride 0.9 % bolus 1,000 mL (1,000 mLs Intravenous New Bag/Given 02/24/13 2124)  morphine 4 MG/ML injection 4 mg (4 mg Intravenous Given 02/24/13 2125)  ondansetron (ZOFRAN) injection 4 mg (4 mg Intravenous Given 02/24/13 2125)    MDM   1. UTI (lower urinary tract infection)   2. Trichomonas     The patient had laboratory tests performed at [facility] earlier today. Patient left prior to being evaluated. She does appear to have an elevated serum white blood cell count. Her urinalysis suggests an infection although there is some squamous cell contamination. Patient also has Trichomonas in her urine. Her symptoms are suggestive of early pyelonephritis with her low-grade temperature and her myalgias. I doubt gallbladder etiology. I doubt appendicitis. She has no focal tenderness in the right lower quadrant.    Celene Kras, MD 02/24/13 [phone]

## 2013-02-26 LAB — URINE CULTURE

## 2013-09-03 IMAGING — CR DG RIBS W/ CHEST 3+V*L*
3 series · 3 of 3 positions shown · non-contrast
Comparison: None.

CLINICAL DATA: Left lower rib pain.

LEFT RIBS AND CHEST - 3+ VIEW

[t ribs ap/pa  lower left]
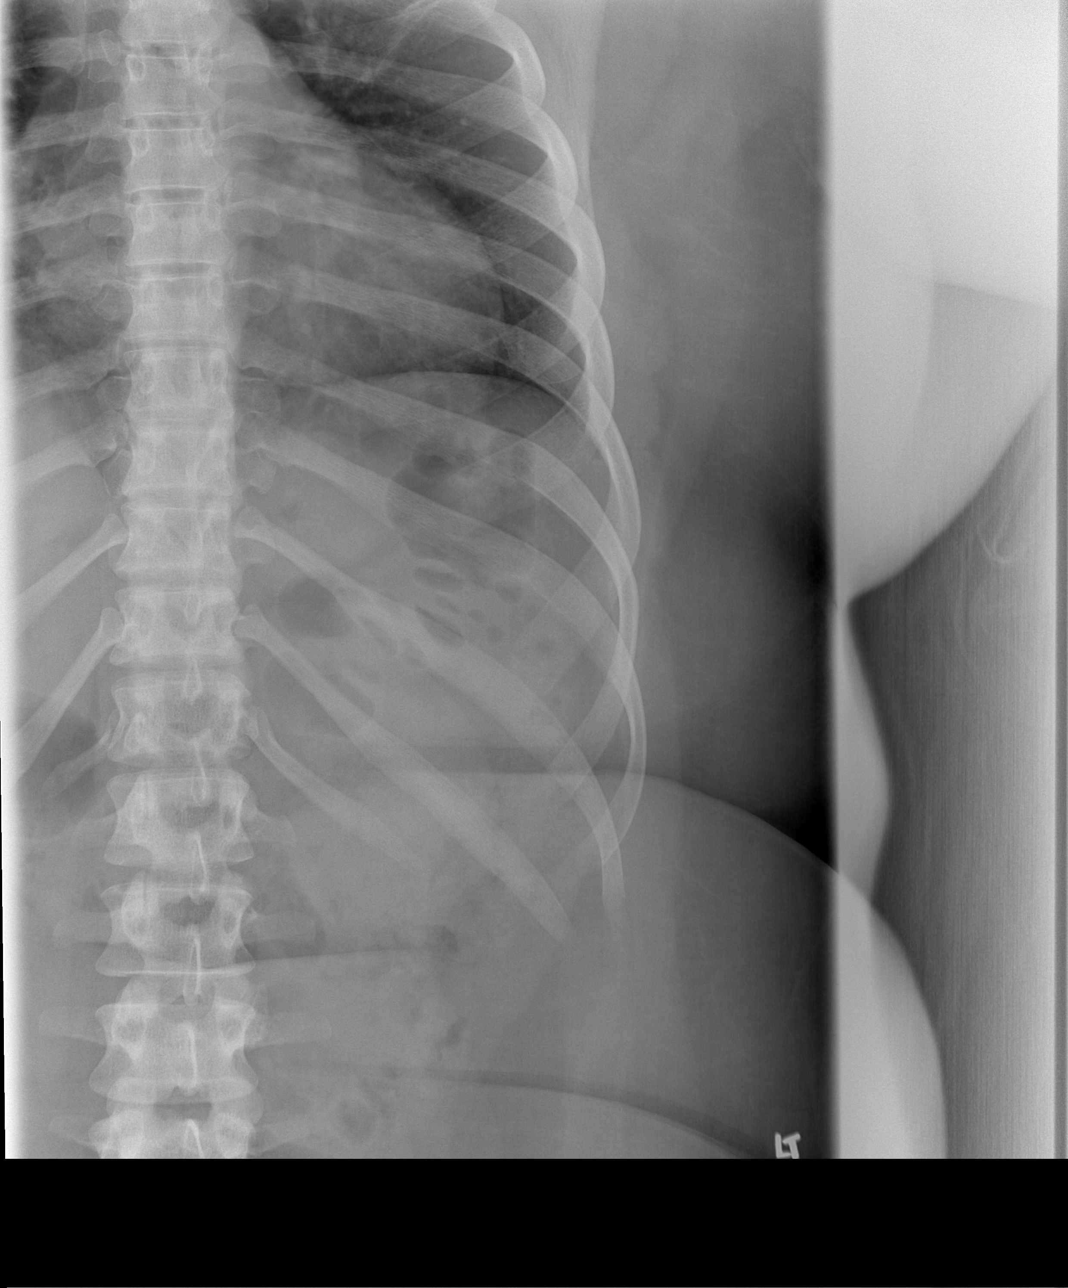

[t ribs obl. left]
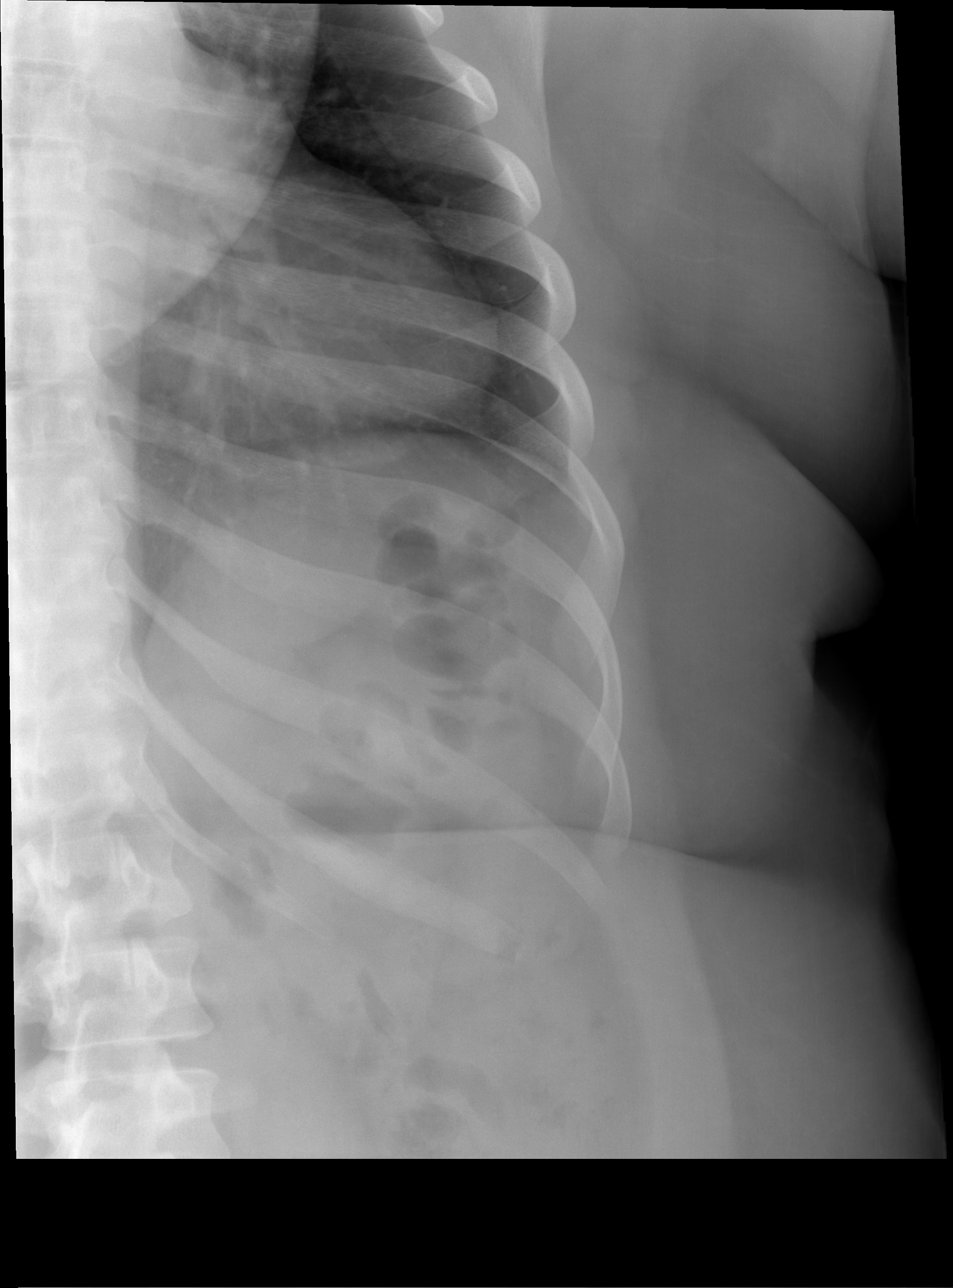

[w chest pa]
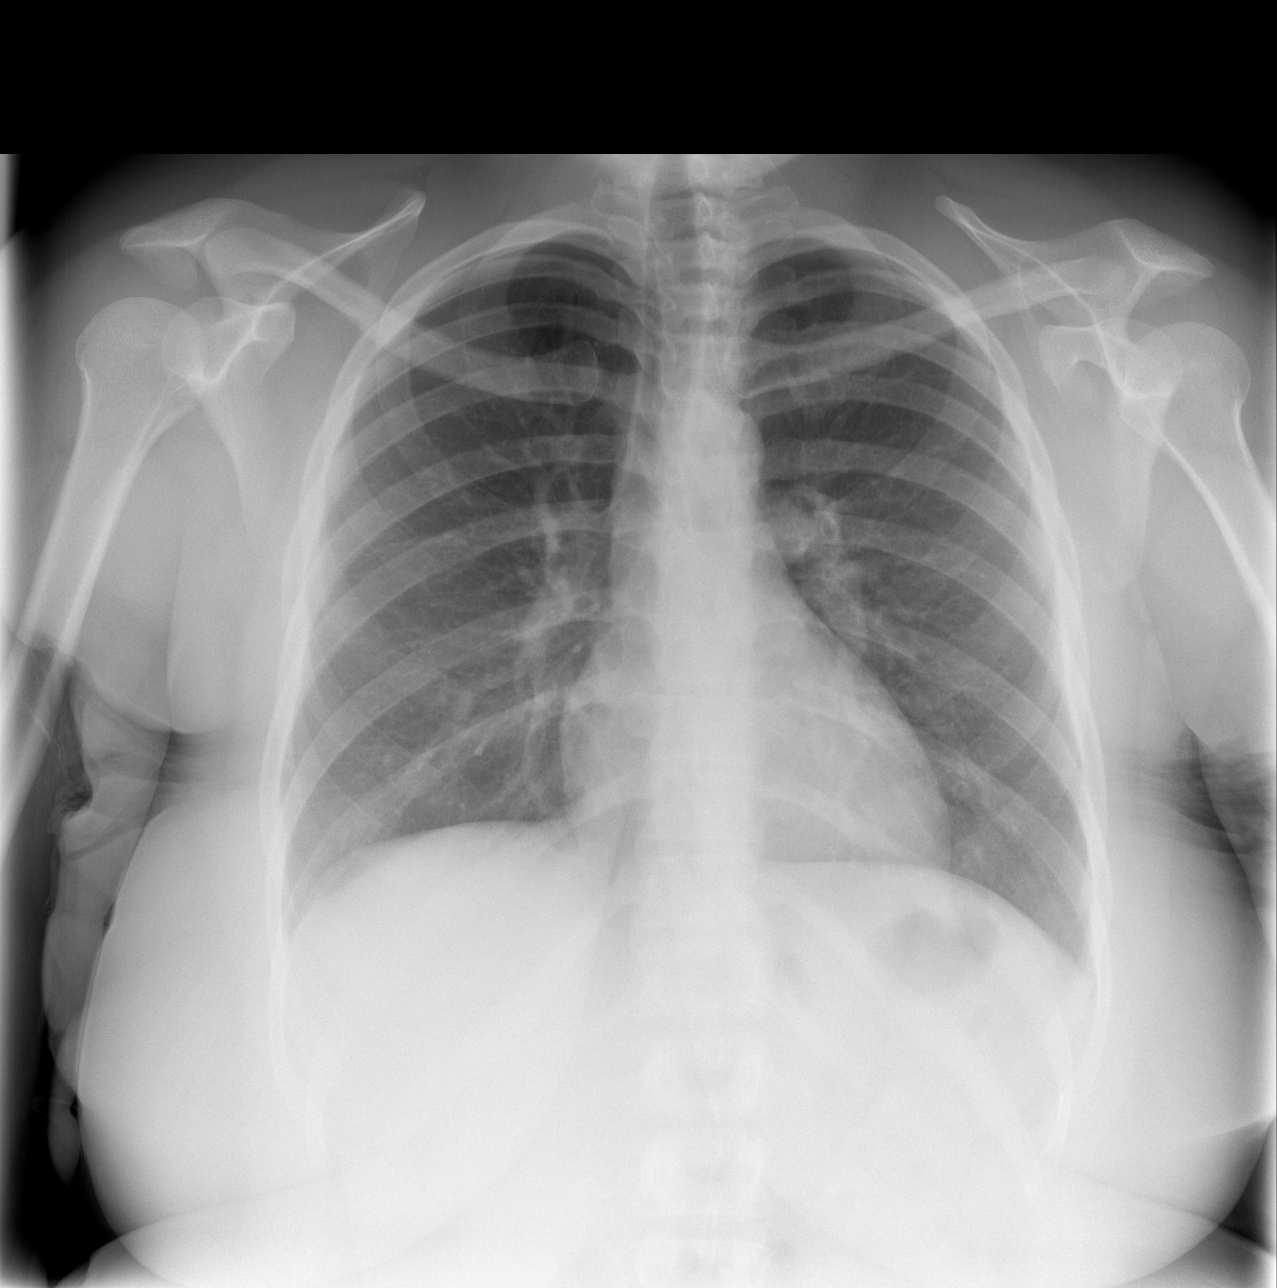

[3 of 3 positions shown; findings below may reference images not displayed]

FINDINGS: The lungs are clear without focal consolidation, edema,
effusion or pneumothorax.  Cardiopericardial silhouette is within
normal limits for size.  Imaged bony structures of the thorax are
intact.

Oblique views of the left ribs show no evidence for a displaced
left-sided rib fracture.
IMPRESSION: Normal exam.
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
## 2013-09-04 ENCOUNTER — Emergency Department (HOSPITAL_COMMUNITY)
Admission: EM | Admit: 2013-09-04 | Discharge: 2013-09-04 | Disposition: A | Discharge status: Home / Self Care | Payer: Medicaid Other | Attending: Emergency Medicine | Admitting: Emergency Medicine

## 2013-09-04 ENCOUNTER — Encounter (HOSPITAL_COMMUNITY): Payer: Self-pay | Admitting: Emergency Medicine

## 2013-09-04 ENCOUNTER — Emergency Department (HOSPITAL_COMMUNITY): Payer: Medicaid Other

## 2013-09-04 DIAGNOSIS — S0100XA Unspecified open wound of scalp, initial encounter: Secondary | ICD-10-CM | POA: Insufficient documentation

## 2013-09-04 DIAGNOSIS — S0003XA Contusion of scalp, initial encounter: Secondary | ICD-10-CM | POA: Insufficient documentation

## 2013-09-04 DIAGNOSIS — S01501A Unspecified open wound of lip, initial encounter: Secondary | ICD-10-CM | POA: Insufficient documentation

## 2013-09-04 DIAGNOSIS — F17200 Nicotine dependence, unspecified, uncomplicated: Secondary | ICD-10-CM | POA: Insufficient documentation

## 2013-09-04 DIAGNOSIS — Z23 Encounter for immunization: Secondary | ICD-10-CM | POA: Insufficient documentation

## 2013-09-04 DIAGNOSIS — S0083XA Contusion of other part of head, initial encounter: Principal | ICD-10-CM

## 2013-09-04 DIAGNOSIS — S01511A Laceration without foreign body of lip, initial encounter: Secondary | ICD-10-CM

## 2013-09-04 DIAGNOSIS — S0101XA Laceration without foreign body of scalp, initial encounter: Secondary | ICD-10-CM

## 2013-09-04 DIAGNOSIS — S1093XA Contusion of unspecified part of neck, initial encounter: Principal | ICD-10-CM

## 2013-09-04 DIAGNOSIS — Z8709 Personal history of other diseases of the respiratory system: Secondary | ICD-10-CM | POA: Insufficient documentation

## 2013-09-04 MED ORDER — HYDROCODONE-ACETAMINOPHEN 5-325 MG PO TABS: 1.0000 | ORAL_TABLET | ORAL | Status: DC | PRN

## 2013-09-04 MED ORDER — HYDROCODONE-ACETAMINOPHEN 5-325 MG PO TABS
1.0000 | ORAL_TABLET | Freq: Once | ORAL | Status: AC
  Administered 2013-09-04: 1 via ORAL
  Filled 2013-09-04: qty 1

## 2013-09-04 MED ORDER — TETANUS-DIPHTH-ACELL PERTUSSIS 5-2.5-18.5 LF-MCG/0.5 IM SUSP
0.5000 mL | Freq: Once | INTRAMUSCULAR | Status: AC
  Administered 2013-09-04: 0.5 mL via INTRAMUSCULAR
  Filled 2013-09-04: qty 0.5
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
## 2013-09-04 NOTE — ED Notes (Signed)
Patient transported to CT 

## 2013-09-04 NOTE — ED Provider Notes (Signed)
Medical screening examination/treatment/procedure(s) were conducted as a shared visit with non-physician practitioner(s) and myself.  I personally evaluated the patient during the encounter.   EKG Interpretation None      Laceration repaired. Infection warnings and head injury warnings given  Lyanne CoKevin M Alicea Wente, MD 09/04/13 [phone]
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
## 2013-09-20 ENCOUNTER — Encounter (HOSPITAL_COMMUNITY): Payer: Self-pay | Admitting: Emergency Medicine

## 2013-09-20 ENCOUNTER — Emergency Department (HOSPITAL_COMMUNITY)
Admission: EM | Admit: 2013-09-20 | Discharge: 2013-09-20 | Disposition: A | Discharge status: Home / Self Care | Payer: Medicaid Other | Attending: Emergency Medicine | Admitting: Emergency Medicine

## 2013-09-20 DIAGNOSIS — F17200 Nicotine dependence, unspecified, uncomplicated: Secondary | ICD-10-CM | POA: Insufficient documentation

## 2013-09-20 DIAGNOSIS — Z4802 Encounter for removal of sutures: Secondary | ICD-10-CM

## 2013-09-20 DIAGNOSIS — J42 Unspecified chronic bronchitis: Secondary | ICD-10-CM | POA: Insufficient documentation
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
## 2013-09-20 NOTE — ED Notes (Signed)
Here for staple removal scalp. Staples intact, wound healed well.

## 2013-09-20 NOTE — Discharge Instructions (Signed)
Read the information below.  You may return to the Emergency Department at any time for worsening condition or any new symptoms that concern you.  If you develop redness, swelling, pus draining from the wound, or fevers greater than 100.4, return to the ER immediately for a recheck.     Staple Removal Care After The staples used to close your skin have been removed. The wound needs continued care so it can heal completely and without problems. The care described here will need to be done for another 5-10 days unless your caregiver advises otherwise.  HOME CARE INSTRUCTIONS   Keep wound site dry and clean.  If skin adhesive strips were applied after the staples were removed, they will begin to peel off in a few days. If they remain after fourteen days, they may be peeled off and discarded.  If you still have a dressing, change it at least once a day or as instructed by your caregiver. If the bandage sticks, soak it off with warm water. Pat dry with a clean towel. Look for signs of infection (see below).  Reapply cream or ointment according to your caregiver's instruction. This will help prevent infection and keep the bandage from sticking. Use of a non-stick material over the wound and under the dressing or wrap will also help keep the bandage from sticking.  If the bandage becomes wet, dirty or develops a foul smell, change it as soon as possible.  New scars become sunburned easily. Use sunscreens with protection factor (SPF) of at least 15 when out in the sun.  Only take over-the-counter or prescription medicines for pain, discomfort or fever as directed by your caregiver. SEEK IMMEDIATE MEDICAL CARE IF:   There is redness, swelling or increasing pain in the wound.  Pus is coming from the wound.  An unexplained oral temperature above 102 F (38.9 C) develops.  You notice a foul smell coming from the wound or dressing.  There is a breaking open of the suture line (edges not staying  together) of the wound edges after staples have been removed. Document Released: 05/09/2008 Document Revised: 08/19/2011 Document Reviewed: 05/09/2008 [facility] Patient Information 2014 [place], [place].

## 2013-09-23 NOTE — ED Provider Notes (Signed)
Medical screening examination/treatment/procedure(s) were performed by non-physician practitioner and as supervising physician I was immediately available for consultation/collaboration.  Jaloni Sorber, MD 09/23/13 0715 

## 2014-02-11 ENCOUNTER — Inpatient Hospital Stay (HOSPITAL_COMMUNITY)
Admission: AD | Admit: 2014-02-11 | Discharge: 2014-02-11 | Disposition: A | Discharge status: Home / Self Care | Payer: Medicaid Other | Source: Ambulatory Visit | Attending: Family Medicine | Admitting: Family Medicine

## 2014-02-11 ENCOUNTER — Encounter (HOSPITAL_COMMUNITY): Payer: Self-pay

## 2014-02-11 DIAGNOSIS — F17200 Nicotine dependence, unspecified, uncomplicated: Secondary | ICD-10-CM | POA: Diagnosis not present

## 2014-02-11 DIAGNOSIS — N6459 Other signs and symptoms in breast: Secondary | ICD-10-CM | POA: Diagnosis not present

## 2014-02-11 DIAGNOSIS — N644 Mastodynia: Secondary | ICD-10-CM | POA: Diagnosis present

## 2014-02-11 NOTE — MAU Provider Note (Signed)
Attestation of Attending Supervision of Advanced Practitioner (PA/CNM/NP): Evaluation and management procedures were performed by the Advanced Practitioner under my supervision and collaboration.  I have reviewed the Advanced Practitioner's note and chart, and I agree with the management and plan.  Jacob Stinson, DO Attending Physician Faculty Practice, [facility]  

## 2014-02-11 NOTE — Discharge Instructions (Signed)

## 2014-02-11 NOTE — MAU Provider Note (Signed)
  History     CSN: [id number]  Arrival date and time: 02/11/14 4403   None     Chief Complaint  Patient presents with  . Breast Pain   HPI Ms. Meghan Pruitt is a 33 y.o. [phone] who presents to MAU today with complaint of 2 weeks of right breast pain. She also states that she has noted inversion of her nipple on the right breast as well. She also states that she has noted a sour odor associated with her right breast. She denies discharge or bleeding from the nipple. She denies rash, redness or swelling. She states occasional itching.   OB History   Grav Para Term Preterm Abortions TAB SAB Ect Mult Living                  Past Medical History  Diagnosis Date  . Chronic bronchitis     No past surgical history on file.  No family history on file.  History  Substance Use Topics  . Smoking status: Current Every Day Smoker    Types: Cigarettes  . Smokeless tobacco: Current User  . Alcohol Use: Yes     Comment: occasionally    Allergies: No Known Allergies  No prescriptions prior to admission    Review of Systems  Constitutional: Negative for fever.  Skin: Positive for itching. Negative for rash.       Neg - erythema, edema   Physical Exam   Blood pressure 134/92, pulse 90, temperature 98.3 F (36.8 C), temperature source Oral, resp. rate 16, height  (1.575 m), weight 223 lb 12.8 oz (101.515 kg), last menstrual period 02/10/2014, SpO2 100.00%.  Physical Exam  Constitutional: She is oriented to person, place, and time. She appears well-developed and well-nourished. No distress.  HENT:  Head: Normocephalic.  Cardiovascular: Normal rate.   Respiratory: Effort normal. Right breast exhibits inverted nipple and tenderness. Right breast exhibits no mass, no nipple discharge and no skin change. Left breast exhibits no inverted nipple, no mass, no nipple discharge, no skin change and no tenderness. Breasts are symmetrical.  Genitourinary: There is breast tenderness  (mild). No breast swelling, discharge or bleeding.  Neurological: She is alert and oriented to person, place, and time.  Skin: Skin is warm and dry. No rash noted.  Psychiatric: She has a normal mood and affect.   MAU Course  Procedures None  MDM Referral to breast center for imaging  Assessment and Plan  A: Right breast tenderness Right nipple inversion  P: Discharge home Patient [facility] to [facility]. Appointment for imaging 02/17/14 at 8:30 am.  Patient may return to MAU as needed or if her condition were to change or worsen   Marny Lowenstein, PA-C  02/11/2014, 10:01 AM

## 2014-02-11 NOTE — MAU Note (Signed)
Patient states the nipple on the right breast became inverted about 2 weeks ago with pain off and on. States she is having a sour smell coming from the breast, no bleeding or discharge from the breast.

## 2014-02-15 ENCOUNTER — Other Ambulatory Visit: Payer: Self-pay | Admitting: Medical

## 2014-02-15 DIAGNOSIS — N6459 Other signs and symptoms in breast: Secondary | ICD-10-CM

## 2014-02-15 DIAGNOSIS — N644 Mastodynia: Secondary | ICD-10-CM

## 2014-02-17 ENCOUNTER — Ambulatory Visit
Admission: RE | Admit: 2014-02-17 | Discharge: 2014-02-17 | Disposition: A | Discharge status: Home / Self Care | Payer: Medicaid Other | Source: Ambulatory Visit | Attending: Medical | Admitting: Medical

## 2014-02-17 ENCOUNTER — Other Ambulatory Visit: Payer: Medicaid Other

## 2014-02-17 ENCOUNTER — Other Ambulatory Visit: Payer: Self-pay | Admitting: Medical

## 2014-02-17 DIAGNOSIS — N6459 Other signs and symptoms in breast: Secondary | ICD-10-CM

## 2014-02-17 DIAGNOSIS — N644 Mastodynia: Secondary | ICD-10-CM

## 2014-02-23 ENCOUNTER — Other Ambulatory Visit: Payer: Self-pay | Admitting: Medical

## 2014-02-23 DIAGNOSIS — N6459 Other signs and symptoms in breast: Secondary | ICD-10-CM

## 2014-02-23 DIAGNOSIS — N644 Mastodynia: Secondary | ICD-10-CM

## 2014-02-25 ENCOUNTER — Ambulatory Visit
Admission: RE | Admit: 2014-02-25 | Discharge: 2014-02-25 | Disposition: A | Discharge status: Home / Self Care | Payer: Medicaid Other | Source: Ambulatory Visit | Attending: Medical | Admitting: Medical

## 2014-02-25 DIAGNOSIS — N6459 Other signs and symptoms in breast: Secondary | ICD-10-CM

## 2014-02-25 DIAGNOSIS — N644 Mastodynia: Secondary | ICD-10-CM

## 2014-02-25 HISTORY — PX: BREAST BIOPSY: SHX20

## 2014-04-11 ENCOUNTER — Encounter (HOSPITAL_COMMUNITY): Payer: Self-pay

## 2014-06-03 ENCOUNTER — Emergency Department (HOSPITAL_COMMUNITY)
Admission: EM | Admit: 2014-06-03 | Discharge: 2014-06-03 | Disposition: A | Discharge status: Home / Self Care | Payer: Medicaid Other | Attending: Emergency Medicine | Admitting: Emergency Medicine

## 2014-06-03 ENCOUNTER — Encounter (HOSPITAL_COMMUNITY): Payer: Self-pay

## 2014-06-03 DIAGNOSIS — K088 Other specified disorders of teeth and supporting structures: Secondary | ICD-10-CM | POA: Diagnosis present

## 2014-06-03 DIAGNOSIS — Z720 Tobacco use: Secondary | ICD-10-CM | POA: Diagnosis not present

## 2014-06-03 DIAGNOSIS — Z79899 Other long term (current) drug therapy: Secondary | ICD-10-CM | POA: Insufficient documentation

## 2014-06-03 DIAGNOSIS — Z8709 Personal history of other diseases of the respiratory system: Secondary | ICD-10-CM | POA: Insufficient documentation

## 2014-06-03 DIAGNOSIS — K047 Periapical abscess without sinus: Secondary | ICD-10-CM | POA: Diagnosis not present

## 2014-06-03 MED ORDER — OXYCODONE-ACETAMINOPHEN 5-325 MG PO TABS: 1.0000 | ORAL_TABLET | Freq: Four times a day (QID) | ORAL | Status: DC | PRN

## 2014-06-03 MED ORDER — NAPROXEN 500 MG PO TABS: 500.0000 mg | ORAL_TABLET | Freq: Two times a day (BID) | ORAL | Status: DC

## 2014-06-03 MED ORDER — PENICILLIN V POTASSIUM 500 MG PO TABS: 500.0000 mg | ORAL_TABLET | Freq: Four times a day (QID) | ORAL | Status: AC

## 2014-06-03 MED ORDER — OXYCODONE-ACETAMINOPHEN 5-325 MG PO TABS
2.0000 | ORAL_TABLET | Freq: Once | ORAL | Status: AC
  Administered 2014-06-03: 2 via ORAL
  Filled 2014-06-03: qty 2

## 2014-06-03 NOTE — Discharge Instructions (Signed)
Abscessed Tooth °An abscessed tooth is an infection around your tooth. It may be caused by holes or damage to the tooth (cavity) or a dental disease. An abscessed tooth causes mild to very bad pain in and around the tooth. See your dentist right away if you have tooth or gum pain. °HOME CARE °· Take your medicine as told. Finish it even if you start to feel better. °· Do not drive after taking pain medicine. °· Rinse your mouth (gargle) often with salt water (¼ teaspoon salt in 8 ounces of warm water). °· Do not apply heat to the outside of your face. °GET HELP RIGHT AWAY IF:  °· You have a temperature by mouth above 102° F (38.9° C), not controlled by medicine. °· You have chills and a very bad headache. °· You have problems breathing or swallowing. °· Your mouth will not open. °· You develop puffiness (swelling) on the neck or around the eye. °· Your pain is not helped by medicine. °· Your pain is getting worse instead of better. °MAKE SURE YOU:  °· Understand these instructions. °· Will watch your condition. °· Will get help right away if you are not doing well or get worse. °Document Released: 11/13/2007 Document Revised: 08/19/2011 Document Reviewed: 09/04/2010 °ExitCare® Patient Information ©2015 [employer]. This information is not intended to replace advice given to you by your health care provider. Make sure you discuss any questions you have with your health care provider. ° °Emergency Department Resource Guide °1) Find a Doctor and Pay Out of Pocket °Although you won't have to find out who is covered by your insurance plan, it is a good idea to ask around and get recommendations. You will then need to call the office and see if the doctor you have chosen will accept you as a new patient and what types of options they offer for patients who are self-pay. Some doctors offer discounts or will set up payment plans for their patients who do not have insurance, but you will need to ask so you aren't surprised  when you get to your appointment. ° °2) Contact Your Local Health Department °Not all health departments have doctors that can see patients for sick visits, but many do, so it is worth a call to see if yours does. If you don't know where your local health department is, you can check in your phone book. The CDC also has a tool to help you locate your state's health department, and many state websites also have listings of all of their local health departments. ° °3) Find a Walk-in Clinic °If your illness is not likely to be very severe or complicated, you may want to try a walk in clinic. These are popping up all over the country in pharmacies, drugstores, and shopping centers. They're usually staffed by nurse practitioners or physician assistants that have been trained to treat common illnesses and complaints. They're usually fairly quick and inexpensive. However, if you have serious medical issues or chronic medical problems, these are probably not your best option. ° °No Primary Care Doctor: °- Call [facility] at  832-8000 - they can help you locate a primary care doctor that  accepts your insurance, provides certain services, etc. °- Physician Referral Service- 1-800-533-3463 ° °Chronic Pain Problems: °Organization         Address  Phone   Notes  °[facility]  (336) 297-2271 Patients need to be referred by their primary care doctor.  ° °  Medication Assistance: °Organization         Address  Phone   Notes  °[place] [facility] [address] °[place], [place] [address] (336) 641-8030 --Must be a resident of [place] °-- Must have NO insurance coverage whatsoever (no Medicaid/ Medicare, etc.) °-- The pt. MUST have a primary care doctor that directs their care regularly and follows them in the community °  °[facility]  (866) 331-1348   °[employer]  (888) 892-1162   ° °Agencies that provide inexpensive medical care: °Organization          Address  Phone   Notes  °[facility]  (336) 832-8035   °[facility]    (336) 832-7272   °[facility] [address] °[place], [place] [address] (336) 832-4777   °[facility] [address], °[place] (336) 271-4999   °[facility]    (336) 373-0678   °[facility]    (336) 272-1050   °[facility] ° [address], [place] Phone:  (336) 832-4444, Fax:  (336) 832-4440 Hours of Operation:  9 am - 6 pm, M-F.  Also accepts Medicaid/Medicare and self-pay.  °[facility] ° [address], [place] Phone: (336) 832-3150, Fax: (336) 832-3151. Hours of Operation:  8:30 am - 5:30 pm, M-F.  Also accepts Medicaid and self-pay.  °[facility] [place] [address], [place] Phone: (336) 878-6027   °[facility] [address], [place], [place] (336)723-1848, Ext. 123 Mondays & Thursdays: 7-9 AM.  First 15 patients are seen on a first come, first serve basis. °  ° °Medicaid-accepting [place] Providers: ° °Organization         Address  Phone   Notes  °[facility] [address], [place] (336) 641-2100 Also accepts self-pay patients.  °[facility] [address], [place] ° (336) 856-9996   °[facility] [address], [place] (336) 288-8857   °[facility] [address] [place] [address], [place] (336) 299-7000   °Veita Bland [address], [place]  ° (336) 373-1557 Only accepts [place] Access Medicaid patients after they have their name applied to their card.  ° °Self-Pay (no insurance) in [place]: ° °Organization         Address  Phone   Notes  °Sickle Cell Patients, [facility] [address], [place] (336) 832-1970   °[facility] [address], [place] (336) 832-4400    °[facility] ° [address], [place] (336) 992-4800   °[facility]/Dr. Osei-Bonsu ° [address] [place] [address], [place] or [address], [place] (336) 841-8500 Phone number for both [place] and [place] locations is the same.  °[facility] [address], [place] (336) 299-0000   °[facility] [address] [place] [address], [place] or [address] (336) 852-7530 °(336) 878-2260   °[facility] [address], [place] (336) 350-1642, phone; (336) 294-5005, fax Sees patients 1st and 3rd Saturday of every month.  Must not qualify for public or private insurance (i.e. Medicaid, Medicare,  Health Choice, Veterans' Benefits) • Household income should be no more than 200% of the poverty level •The   clinic cannot treat you if you are pregnant or think you are pregnant • Sexually transmitted diseases are not treated at the clinic.  ° ° °Dental Care: °Organization         Address  Phone  Notes  °[facility] [address], [place] (336) 641-6152 Accepts children up to age 21 who are enrolled in Medicaid or [place] Health Choice; pregnant women with a Medicaid card; and children who have applied for Medicaid or [place] Health Choice, but were declined, whose parents can pay a reduced fee at time of service.  °[facility] [place]  [address], [place] (336) 641-7733 Accepts children up to age 21 who are enrolled in Medicaid or [place] Health Choice; pregnant women with a Medicaid card; and children who have applied for Medicaid or [place] Health Choice, but were declined, whose parents can pay a reduced fee at time of service.  °[facility] ° [address], [place] (336) 641-4533 Patients are seen by appointment only. Walk-ins are not accepted. [facility] will see patients 18  years of age and older. °Monday - Tuesday (8am-5pm) °Most Wednesdays (8:30-5pm) °$30 per visit, cash only  °[facility] ° [address], [place] (336) 641-4533 Patients are seen by appointment only. Walk-ins are not accepted. [facility] will see patients 18 years of age and older. °One Wednesday Evening (Monthly: Volunteer Based).  $30 per visit, cash only  °[facility]  (919) 537-3737 for adults; Children under age 4, call Graduate Pediatric Dentistry at (919) [phone]. Children aged 4-14, please call (919) [phone] to request a pediatric application. ° Dental services are provided in all areas of dental care including fillings, crowns and bridges, complete and partial dentures, implants, gum treatment, root canals, and extractions. Preventive care is also provided. Treatment is provided to both adults and children. °Patients are selected via a lottery and there is often a waiting list. °  °[facility] [address], °[place] ° (336) 763-8833 [email or web address] °  °[facility] [address], [place], [place] (336)723-1848, Ext. 123 Second and Fourth Thursday of each month, opens at 6:30 AM; Clinic ends at 9 AM.  Patients are seen on a first-come first-served basis, and a limited number are seen during each clinic.  ° °[facility] ° [address], [place], [place] (336) 723-7904   Eligibility Requirements °You must have lived in [place], [place], or [place] counties for at least the last three months. °  You cannot be eligible for state or federal sponsored healthcare insurance, including Veterans Administration, Medicaid, or Medicare. °  You generally cannot be eligible for healthcare insurance through your employer.  °  How to apply: °Eligibility screenings are held every Tuesday and Wednesday afternoon from 1:00 pm until 4:00 pm. You do not need an appointment for the interview!  °[facility]  [address], [place], [place] 336-631-2330   °[facility]  336-342-8273   °[facility]  336-703-3100   °[facility]  336-570-6415   ° °Behavioral Health Resources in the Community: °Intensive Outpatient Programs °Organization         Address  Phone  Notes  °[facility] [address], [place], [place] 336-878-6098   °[facility] [address], [place],   [place] 336-832-9800   °[facility] [address], [place], [place] ° 336-882-2125   °[facility] [address],  °[place], [place] 1-800-853-5163 or 336-641-4981   °Substance Abuse Resources °Organization         Address  Phone  Notes  °[facility]  336-882-2125   °[facility]  336-784-9470   °[facility]  336-285-9073   °[facility]  336-845-3988   °Residential & Outpatient Substance Abuse Program  1-800-659-3381   °Psychological Services °Organization         Address  Phone  Notes  °[facility]  336- 832-9600   °[facility]  336- 378-7881   °[facility] [address], [place] 1-800-853-5163 or 336-641-4981   ° °Mobile Crisis Teams °Organization         Address  Phone  Notes  °[facility], Mobile Crisis Care Unit  1-877-626-1772   °Assertive °[facility] ° [address] [place], [place] 336-834-9664   °Sharon DeEsch [address] °[place] 336-554-5454   ° °Self-Help/Support Groups °Organization         Address  Phone             Notes  °[facility] - variety of support groups  336- 373-1402 Call for more information  °Narcotics Anonymous (NA), [facility] [address], °[place]  2 meetings at this location  ° °Residential Treatment Programs °Organization         Address  Phone  Notes  °[facility] [address],    °[place]   1-866-801-8205   °[facility] ° [address], [place], [place] 704-293-8524   °[facility] [address], [place] 336-845-3988 Admissions: 8am-3pm M-F  °[facility] [address]    °[place], [place] 336-841-1104   °[facility] [address], [place], [place] 336-379-7146   °[facility] [address]  °[place], [place] 336-285-9073   °[facility] - Intensive Outpatient [address], [place], [place] 336-852-3033   °[facility] [address]  °[place], [place] 1-877-615-2722 or 336-784-9470   °[facility] [address] [place], [place] 336-227-7417 Accepts Medicaid  °[facility] [address]  °[place] 1-800-659-3381 Substance Abuse/Addiction Treatment  ° °[place] Behavioral Health Resources °Organization         Address  Phone  Notes  °[facility]  (888) 581-9988   °Julie Brannon, PhD [address] [place], [place]   (336) 349-5553 or (336) 951-0000   °[facility]   [address] °[place], [place] (336) 349-4454   °[facility] [address], [place], [place] (336) 342-8316 Insurance/Medicaid/sponsorship through [facility]  °[facility] [address]                                    [place], [place] (336) 342-8316 Therapy/tele-psych/case  °[facility] [address]  ° [place], [place] (336) 349-2233    °Dr. Arfeen  (336) 349-4544   °[facility] [place]  [employer] [place] [facility] 1) [address], [place] °2) [address], [place] °3)  [address], [place] (336) 349-3220 °(336) 342-7768 ° °(  336) 342-8140   °[place] [facility] (336) 342-1394 or (336) 342-3537 (After Hours)    ° ° °

## 2014-06-03 NOTE — ED Provider Notes (Signed)
CSN: [id number]     Arrival date & time 06/03/14  [id number] History   First MD Initiated Contact with Patient 06/03/14 [phone]     Chief Complaint  Patient presents with  . Dental Pain     (Consider location/radiation/quality/duration/timing/severity/associated sxs/prior Treatment) HPI Comments: Patient presents with dental pain for 1 day.  Pain is radiating across straw in into her head.  She has had no fevers or chills.  No trouble swallowing.  No trismus.  Patient is a 33 y.o. female presenting with tooth pain.  Dental Pain Associated symptoms: no congestion, no facial swelling, no fever, no headaches and no neck pain     Past Medical History  Diagnosis Date  . Chronic bronchitis    History reviewed. No pertinent past surgical history. Family History  Problem Relation Age of Onset  . Heart disease Maternal Grandfather   . Heart disease Paternal Grandmother    History  Substance Use Topics  . Smoking status: Current Every Day Smoker    Types: Cigarettes  . Smokeless tobacco: Current User  . Alcohol Use: Yes     Comment: occasionally   OB History    Gravida Para Term Preterm AB TAB SAB Ectopic Multiple Living   2 2 2       2      Review of Systems  Constitutional: Negative for fever, chills, diaphoresis, activity change, appetite change and fatigue.  HENT: Positive for dental problem. Negative for congestion, facial swelling, rhinorrhea and sore throat.   Eyes: Negative for photophobia and discharge.  Respiratory: Negative for cough, chest tightness and shortness of breath.   Cardiovascular: Negative for chest pain, palpitations and leg swelling.  Gastrointestinal: Negative for nausea, vomiting, abdominal pain and diarrhea.  Endocrine: Negative for polydipsia and polyuria.  Genitourinary: Negative for dysuria, frequency, difficulty urinating and pelvic pain.  Musculoskeletal: Negative for back pain, arthralgias, neck pain and neck stiffness.  Skin: Negative for color change  and wound.  Allergic/Immunologic: Negative for immunocompromised state.  Neurological: Negative for facial asymmetry, weakness, numbness and headaches.  Hematological: Does not bruise/bleed easily.  Psychiatric/Behavioral: Negative for confusion and agitation.      Allergies  Review of patient's allergies indicates no known allergies.  Home Medications   Prior to Admission medications   Medication Sig Start Date End Date Taking? Authorizing Provider  etonogestrel (IMPLANON) 68 MG IMPL implant Inject 1 each into the skin continuous.    Historical Provider, MD  ibuprofen (ADVIL,MOTRIN) 600 MG tablet Take 600 mg by mouth every 6 (six) hours as needed for headache or moderate pain.    Historical Provider, MD  naproxen (NAPROSYN) 500 MG tablet Take 1 tablet (500 mg total) by mouth 2 (two) times daily with a meal. 06/03/14   Toy CookeyMegan Clayton Jarmon, MD  oxyCODONE-acetaminophen (PERCOCET) 5-325 MG per tablet Take 1 tablet by mouth every 6 (six) hours as needed. 06/03/14   Toy CookeyMegan Lylah Lantis, MD  penicillin v potassium (VEETID) 500 MG tablet Take 1 tablet (500 mg total) by mouth 4 (four) times daily. For full 10 days 06/03/14 06/10/14  Toy CookeyMegan Hortense Cantrall, MD   BP 135/101 mmHg  Pulse 92  Temp(Src) 98 F (36.7 C) (Oral)  Resp 18  Ht 5\' 2"  (1.575 m)  Wt 215 lb (97.523 kg)  BMI 39.31 kg/m2  SpO2 99%  LMP 05/14/2014 Physical Exam  Constitutional: She is oriented to person, place, and time. She appears well-developed and well-nourished. No distress.  HENT:  Head: Normocephalic and atraumatic.  Mouth/Throat: No oropharyngeal exudate.  Eyes: Pupils are equal, round, and reactive to light.  Neck: Normal range of motion. Neck supple.  Cardiovascular: Normal rate, regular rhythm and normal heart sounds.  Exam reveals no gallop and no friction rub.   No murmur heard. Pulmonary/Chest: Effort normal and breath sounds normal. No respiratory distress. She has no wheezes. She has no rales.  Abdominal: Soft. Bowel  sounds are normal. She exhibits no distension and no mass. There is no tenderness. There is no rebound and no guarding.  Musculoskeletal: Normal range of motion. She exhibits no edema or tenderness.  Neurological: She is alert and oriented to person, place, and time.  Skin: Skin is warm and dry.  Psychiatric: She has a normal mood and affect.    ED Course  Procedures (including critical care time) Labs Review Labs Reviewed - No data to display  Imaging Review No results found.   EKG Interpretation None      MDM   Final diagnoses:  Periapical abscess    Pt is a 33 y.o. female with Pmhx as above who presents with dental pain for 1 day with associated jaw pain and headache.  No fever, trismus or difficulty swallowing.  On physical exam, she has generalized poor dentition.  She is tenderness to percussion of molar #32 as well as gum swelling but no evidence of abscess.  We'll discharge home with Pen-Vee K, Percocet and naproxen for pain.  She is instructed to follow-up with dentistry.    Georgann HousekeeperEndear C Abercrombie evaluation in the Emergency Department is complete. It has been determined that no acute conditions requiring further emergency intervention are present at this time. The patient/guardian have been advised of the diagnosis and plan. We have discussed signs and symptoms that warrant return to the ED, such as changes or worsening in symptoms, fever, facial swelling, difficulty swallowing      Toy CookeyMegan Joselin Crandell, MD 06/03/14 [phone]

## 2014-06-03 NOTE — ED Notes (Signed)
Pt states that right lower tooth has been hurting for two days, pain is now unbearable, pain is shooting across face and up temple

## 2014-10-29 ENCOUNTER — Encounter (HOSPITAL_COMMUNITY): Payer: Self-pay

## 2014-10-29 ENCOUNTER — Emergency Department (HOSPITAL_COMMUNITY): Payer: Medicaid Other

## 2014-10-29 ENCOUNTER — Emergency Department (HOSPITAL_COMMUNITY)
Admission: EM | Admit: 2014-10-29 | Discharge: 2014-10-29 | Disposition: A | Discharge status: Home / Self Care | Payer: Self-pay | Attending: Emergency Medicine | Admitting: Emergency Medicine

## 2014-10-29 DIAGNOSIS — Z720 Tobacco use: Secondary | ICD-10-CM | POA: Insufficient documentation

## 2014-10-29 DIAGNOSIS — R059 Cough, unspecified: Secondary | ICD-10-CM

## 2014-10-29 DIAGNOSIS — R509 Fever, unspecified: Secondary | ICD-10-CM

## 2014-10-29 DIAGNOSIS — B349 Viral infection, unspecified: Secondary | ICD-10-CM | POA: Insufficient documentation

## 2014-10-29 DIAGNOSIS — R05 Cough: Secondary | ICD-10-CM

## 2014-10-29 LAB — RAPID STREP SCREEN (MED CTR MEBANE ONLY): Streptococcus, Group A Screen (Direct): NEGATIVE

## 2014-10-29 MED ORDER — DEXTROMETHORPHAN POLISTIREX ER 30 MG/5ML PO SUER: 30.0000 mg | ORAL | Status: DC | PRN

## 2014-10-29 MED ORDER — DIPHENHYDRAMINE HCL 50 MG/ML IJ SOLN
25.0000 mg | Freq: Once | INTRAMUSCULAR | Status: AC
  Administered 2014-10-29: 25 mg via INTRAVENOUS
  Filled 2014-10-29: qty 1

## 2014-10-29 MED ORDER — MELOXICAM 15 MG PO TABS: 15.0000 mg | ORAL_TABLET | Freq: Every day | ORAL | Status: DC

## 2014-10-29 MED ORDER — METOCLOPRAMIDE HCL 5 MG/ML IJ SOLN
10.0000 mg | Freq: Once | INTRAMUSCULAR | Status: AC
  Administered 2014-10-29: 10 mg via INTRAVENOUS
  Filled 2014-10-29: qty 2

## 2014-10-29 MED ORDER — SODIUM CHLORIDE 0.9 % IV BOLUS (SEPSIS)
1000.0000 mL | Freq: Once | INTRAVENOUS | Status: AC
  Administered 2014-10-29: 1000 mL via INTRAVENOUS

## 2014-10-29 MED ORDER — KETOROLAC TROMETHAMINE 30 MG/ML IJ SOLN
30.0000 mg | Freq: Once | INTRAMUSCULAR | Status: AC
  Administered 2014-10-29: 30 mg via INTRAVENOUS
  Filled 2014-10-29: qty 1

## 2014-10-29 NOTE — Discharge Instructions (Signed)
Take mobic as needed for pain and headache. Take delsym for cough. Refer to attached documents for more information.

## 2014-10-29 NOTE — ED Notes (Signed)
Pt started having sore throat, headache, body aches since Thursday and has been trying to self medicate but nothing is helping.

## 2014-10-29 NOTE — ED Provider Notes (Signed)
CSN: [id number]     Arrival date & time 10/29/14  1105 History   First MD Initiated Contact with Patient 10/29/14 1116     Chief Complaint  Patient presents with  . Sore Throat  . Headache  . Generalized Body Aches     (Consider location/radiation/quality/duration/timing/severity/associated sxs/prior Treatment) HPI Comments: Patient is a 34 year old female who presents with a 3 day history of sore throat. Patient reports gradual onset and progressively worsening sharp, severe throat pain. The pain is constant and made worse with swallowing. The pain is localized to the patient's throat and equal on both sides. Nothing alleviates the pain. The patient has tried OTC medication for symptoms without relief. Patient reports associated subjective fever, cervical adenopathy, and productive cough with green sputum. Patient denies headache, visual changes, sinus congestion, difficulty breathing, chest pain, SOB, abdominal pain, NVD.     Patient is a 34 y.o. female presenting with pharyngitis and headaches.  Sore Throat Associated symptoms include coughing, headaches and a sore throat.  Headache Associated symptoms: cough and sore throat     Past Medical History  Diagnosis Date  . Chronic bronchitis    History reviewed. No pertinent past surgical history. Family History  Problem Relation Age of Onset  . Heart disease Maternal Grandfather   . Heart disease Paternal Grandmother    History  Substance Use Topics  . Smoking status: Current Every Day Smoker    Types: Cigarettes  . Smokeless tobacco: Current User  . Alcohol Use: Yes     Comment: occasionally   OB History    Gravida Para Term Preterm AB TAB SAB Ectopic Multiple Living   2 2 2       2      Review of Systems  HENT: Positive for sore throat.   Respiratory: Positive for cough.   Neurological: Positive for headaches.  All other systems reviewed and are negative.     Allergies  Review of patient's allergies indicates  no known allergies.  Home Medications   Prior to Admission medications   Medication Sig Start Date End Date Taking? Authorizing Provider  etonogestrel (IMPLANON) 68 MG IMPL implant Inject 1 each into the skin continuous.   Yes Historical Provider, MD  ibuprofen (ADVIL,MOTRIN) 600 MG tablet Take 600 mg by mouth every 6 (six) hours as needed for headache or moderate pain.   Yes Historical Provider, MD  oxyCODONE-acetaminophen (PERCOCET) 5-325 MG per tablet Take 1 tablet by mouth every 6 (six) hours as needed. Patient taking differently: Take 1 tablet by mouth once.  06/03/14  Yes Toy CookeyMegan Docherty, MD  Phenylephrine-Pheniramine-DM [facility](THERAFLU COLD & COUGH PO) Take 1 Dose by mouth every 6 (six) hours as needed (cold symptoms).   Yes Historical Provider, MD  naproxen (NAPROSYN) 500 MG tablet Take 1 tablet (500 mg total) by mouth 2 (two) times daily with a meal. Patient not taking: Reported on 10/29/2014 06/03/14   Toy CookeyMegan Docherty, MD   BP 108/66 mmHg  Pulse 85  Temp(Src) 98.4 F (36.9 C) (Oral)  Resp 16  Ht 5\' 2"  (1.575 m)  Wt 208 lb 8 oz (94.575 kg)  BMI 38.13 kg/m2  SpO2 99%  LMP 10/19/2014 Physical Exam  Constitutional: She is oriented to person, place, and time. She appears well-developed and well-nourished. No distress.  HENT:  Head: Normocephalic and atraumatic.  Erythema and tonsillar edema of tonsils and posterior pharynx.   Eyes: Conjunctivae and EOM are normal.  Neck: Normal range of motion.  Cardiovascular: Normal rate  and regular rhythm.  Exam reveals no gallop and no friction rub.   No murmur heard. Pulmonary/Chest: Effort normal and breath sounds normal. She has no wheezes. She has no rales. She exhibits no tenderness.  Patient coughing  Abdominal: Soft. She exhibits no distension. There is no tenderness.  Musculoskeletal: Normal range of motion.  Neurological: She is alert and oriented to person, place, and time. Coordination normal.  Speech is goal-oriented. Moves limbs  without ataxia.   Skin: Skin is warm and dry.  Psychiatric: She has a normal mood and affect. Her behavior is normal.  Nursing note and vitals reviewed.   ED Course  Procedures (including critical care time) Labs Review Labs Reviewed  RAPID STREP SCREEN  CULTURE, GROUP A STREP    Imaging Review Dg Chest 2 View  10/29/2014   CLINICAL DATA:  Productive cough and fever for 2 days.  EXAM: CHEST  2 VIEW  COMPARISON:  Chest radiograph 12/20/2011  FINDINGS: Stable cardiac and mediastinal contours. No consolidative pulmonary opacities. No pleural effusion or pneumothorax. Regional skeleton is unremarkable.  IMPRESSION: No acute cardiopulmonary process.   Electronically Signed   By: Annia Belt M.D.   On: 10/29/2014 13:12     EKG Interpretation None      MDM   Final diagnoses:  Cough  Fever  Viral illness    12:22 PM Rapid strep and chest xray pending. Vitals stable and patient afebrile. Patient given migraine cocktail for headache.   2:30 PM Rapid strep and xray unremarkable for acute changes. Patient feeling better. Patient will be discharged with symptomatic treatment. Patient likely has viral illness.   Emilia Beck, PA-C 10/29/14 1432  Richardean Canal, MD 10/29/14 [phone]

## 2014-10-29 NOTE — ED Notes (Signed)
Patient transported to X-ray 

## 2014-11-02 LAB — CULTURE, GROUP A STREP

## 2015-03-20 ENCOUNTER — Encounter (HOSPITAL_COMMUNITY): Payer: Self-pay

## 2015-03-20 ENCOUNTER — Emergency Department (HOSPITAL_COMMUNITY)
Admission: EM | Admit: 2015-03-20 | Discharge: 2015-03-20 | Disposition: A | Discharge status: Home / Self Care | Payer: Medicaid Other | Attending: Emergency Medicine | Admitting: Emergency Medicine

## 2015-03-20 DIAGNOSIS — Z8709 Personal history of other diseases of the respiratory system: Secondary | ICD-10-CM | POA: Insufficient documentation

## 2015-03-20 DIAGNOSIS — Z791 Long term (current) use of non-steroidal anti-inflammatories (NSAID): Secondary | ICD-10-CM | POA: Insufficient documentation

## 2015-03-20 DIAGNOSIS — Z720 Tobacco use: Secondary | ICD-10-CM | POA: Insufficient documentation

## 2015-03-20 DIAGNOSIS — K047 Periapical abscess without sinus: Secondary | ICD-10-CM

## 2015-03-20 MED ORDER — PENICILLIN V POTASSIUM 500 MG PO TABS
500.0000 mg | ORAL_TABLET | Freq: Once | ORAL | Status: AC
  Administered 2015-03-20: 500 mg via ORAL
  Filled 2015-03-20: qty 1

## 2015-03-20 MED ORDER — TRAMADOL HCL 50 MG PO TABS: 50.0000 mg | ORAL_TABLET | Freq: Four times a day (QID) | ORAL | Status: DC | PRN

## 2015-03-20 MED ORDER — TRAMADOL HCL 50 MG PO TABS
50.0000 mg | ORAL_TABLET | Freq: Once | ORAL | Status: AC
  Administered 2015-03-20: 50 mg via ORAL
  Filled 2015-03-20: qty 1

## 2015-03-20 MED ORDER — PENICILLIN V POTASSIUM 500 MG PO TABS
500.0000 mg | ORAL_TABLET | Freq: Four times a day (QID) | ORAL | Status: DC
Start: 2015-03-20 — End: 2016-06-18

## 2015-03-20 NOTE — ED Provider Notes (Signed)
CSN: [id number]     Arrival date & time 03/20/15  2228 History  By signing my name below, I, [name], attest that this documentation has been prepared under the direction and in the presence of Earley Favor, NP. Electronically Signed: [name], ED Scribe. 03/20/2015. 11:48 PM.    Chief Complaint  Patient presents with  . Dental Pain   Patient is a 34 y.o. female presenting with tooth pain. The history is provided by the patient. No language interpreter was used.  Dental Pain Location:  Upper Upper teeth location: left. Severity:  Severe Onset quality:  Gradual Timing:  Constant Progression:  Worsening Chronicity:  Chronic Context: abscess   Relieved by:  None tried Worsened by:  Nothing tried Ineffective treatments:  None tried  HPI Comments: Meghan Pruitt is a 34 y.o. female who presents to the Emergency Department complaining of upper left dental pain that has been ongoing for 1 year. She reports she has not yet seen a dentist because she is waiting for her insurance to set in. Patient has NKDA.  Past Medical History  Diagnosis Date  . Chronic bronchitis (HCC)    History reviewed. No pertinent past surgical history. Family History  Problem Relation Age of Onset  . Heart disease Maternal Grandfather   . Heart disease Paternal Grandmother    Social History  Substance Use Topics  . Smoking status: Current Every Day Smoker    Types: Cigarettes  . Smokeless tobacco: Current User  . Alcohol Use: Yes     Comment: occasionally   OB History    Gravida Para Term Preterm AB TAB SAB Ectopic Multiple Living   Review of Systems  HENT: Positive for dental problem.   All other systems reviewed and are negative.    Allergies  Review of patient's allergies indicates no known allergies.  Home Medications   Prior to Admission medications   Medication Sig Start Date End Date Taking? Authorizing Provider  dextromethorphan (DELSYM) 30 MG/5ML liquid Take  5 mLs (30 mg total) by mouth as needed for cough. 10/29/14   Kaitlyn Szekalski, PA-C  etonogestrel (IMPLANON) 68 MG IMPL implant Inject 1 each into the skin continuous.    Historical Provider, MD  ibuprofen (ADVIL,MOTRIN) 600 MG tablet Take 600 mg by mouth every 6 (six) hours as needed for headache or moderate pain.    Historical Provider, MD  meloxicam (MOBIC) 15 MG tablet Take 1 tablet (15 mg total) by mouth daily. 10/29/14   Kaitlyn Szekalski, PA-C  naproxen (NAPROSYN) 500 MG tablet Take 1 tablet (500 mg total) by mouth 2 (two) times daily with a meal. Patient not taking: Reported on 10/29/2014 06/03/14   Toy Cookey, MD  oxyCODONE-acetaminophen (PERCOCET) 5-325 MG per tablet Take 1 tablet by mouth every 6 (six) hours as needed. Patient taking differently: Take 1 tablet by mouth once.  06/03/14   Toy Cookey, MD  penicillin v potassium (VEETID) 500 MG tablet Take 1 tablet (500 mg total) by mouth 4 (four) times daily. 03/20/15   Earley Favor, NP  Phenylephrine-Pheniramine-DM [facility] COLD & COUGH PO) Take 1 Dose by mouth every 6 (six) hours as needed (cold symptoms).    Historical Provider, MD  traMADol (ULTRAM) 50 MG tablet Take 1 tablet (50 mg total) by mouth every 6 (six) hours as needed. 03/20/15   Earley Favor, NP   BP 150/104 mmHg  Pulse 89  Temp(Src) 98  F (36.7 C) (Oral)  Resp 22  SpO2 100%  LMP 03/20/2015 Physical Exam  Constitutional: She is oriented to person, place, and time. She appears well-developed and well-nourished. No distress.  HENT:  Head: Normocephalic and atraumatic.  Mouth/Throat:    Eyes: Conjunctivae and EOM are normal.  Neck: Neck supple. No tracheal deviation present.  Cardiovascular: Normal rate.   Pulmonary/Chest: Effort normal. No respiratory distress.  Musculoskeletal: Normal range of motion.  Neurological: She is alert and oriented to person, place, and time.  Skin: Skin is warm and dry.  Psychiatric: She has a normal mood and affect. Her behavior  is normal.  Nursing note and vitals reviewed.   ED Course  Procedures (including critical care time)  DIAGNOSTIC STUDIES: Oxygen Saturation is 100% on RA, normal by my interpretation.    COORDINATION OF CARE: 11:39 PM - Discussed treatment plan with pt at bedside which includes antibiotics, pain medication, and referral to dentist. Pt verbalized understanding and agreed to plan.   MDM   Final diagnoses:  Dental abscess    I personally performed the services described in this documentation, which was scribed in my presence. The recorded information has been reviewed and is accurate.    Earley Favor, NP 03/20/15 1610  Earley Favor, NP 03/20/15 2348  April Palumbo, MD 03/21/15 [id number]

## 2015-03-20 NOTE — ED Notes (Signed)
Pt complains of dental pain on the upper left for awhile, her tooth is cracked and almost gone

## 2015-03-20 NOTE — Discharge Instructions (Signed)
Dental Abscess °A dental abscess is a collection of pus in or around a tooth. °CAUSES °This condition is caused by a bacterial infection around the root of the tooth that involves the inner part of the tooth (pulp). It may result from: °· Severe tooth decay. °· Trauma to the tooth that allows bacteria to enter into the pulp, such as a broken or chipped tooth. °· Severe gum disease around a tooth. °SYMPTOMS °Symptoms of this condition include: °· Severe pain in and around the infected tooth. °· Swelling and redness around the infected tooth, in the mouth, or in the face. °· Tenderness. °· Pus drainage. °· Bad breath. °· Bitter taste in the mouth. °· Difficulty swallowing. °· Difficulty opening the mouth. °· Nausea. °· Vomiting. °· Chills. °· Swollen neck glands. °· Fever. °DIAGNOSIS °This condition is diagnosed with examination of the infected tooth. During the exam, your dentist may tap on the infected tooth. Your dentist will also ask about your medical and dental history and may order X-rays. °TREATMENT °This condition is treated by eliminating the infection. This may be done with: °· Antibiotic medicine. °· A root canal. This may be performed to save the tooth. °· Pulling (extracting) the tooth. This may also involve draining the abscess. This is done if the tooth cannot be saved. °HOME CARE INSTRUCTIONS °· Take medicines only as directed by your dentist. °· If you were prescribed antibiotic medicine, finish all of it even if you start to feel better. °· Rinse your mouth (gargle) often with salt water to relieve pain or swelling. °· Do not drive or operate heavy machinery while taking pain medicine. °· Do not apply heat to the outside of your mouth. °· Keep all follow-up visits as directed by your dentist. This is important. °SEEK MEDICAL CARE IF: °· Your pain is worse and is not helped by medicine. °SEEK IMMEDIATE MEDICAL CARE IF: °· You have a fever or chills. °· Your symptoms suddenly get worse. °· You have a  very bad headache. °· You have problems breathing or swallowing. °· You have trouble opening your mouth. °· You have swelling in your neck or around your eye. °  °This information is not intended to replace advice given to you by your health care provider. Make sure you discuss any questions you have with your health care provider. °  °Document Released: 05/27/2005 Document Revised: 10/11/2014 Document Reviewed: 05/24/2014 °Elsevier Interactive Patient Education ©2016 [employer] ° °Dental Care and Dentist Visits °Dental care supports good overall health. Regular dental visits can also help you avoid dental pain, bleeding, infection, and other more serious health problems in the future. It is important to keep the mouth healthy because diseases in the teeth, gums, and other oral tissues can spread to other areas of the body. Some problems, such as diabetes, heart disease, and pre-term labor have been associated with poor oral health.  °See your dentist every 6 months. If you experience emergency problems such as a toothache or broken tooth, go to the dentist right away. If you see your dentist regularly, you may catch problems early. It is easier to be treated for problems in the early stages.  °WHAT TO EXPECT AT A DENTIST VISIT  °Your dentist will look for many common oral health problems and recommend proper treatment. At your regular dental visit, you can expect: °· Gentle cleaning of the teeth and gums. This includes scraping and polishing. This helps to remove the sticky substance around the teeth and gums (  plaque). Plaque forms in the mouth shortly after eating. Over time, plaque hardens on the teeth as tartar. If tartar is not removed regularly, it can cause problems. Cleaning also helps remove stains.  Periodic X-rays. These pictures of the teeth and supporting bone will help your dentist assess the health of your teeth.  Periodic fluoride treatments. Fluoride is a natural mineral shown to help  strengthen teeth. Fluoride treatmentinvolves applying a fluoride gel or varnish to the teeth. It is most commonly done in children.  Examination of the mouth, tongue, jaws, teeth, and gums to look for any oral health problems, such as:  Cavities (dental caries). This is decay on the tooth caused by plaque, sugar, and acid in the mouth. It is best to catch a cavity when it is small.  Inflammation of the gums caused by plaque buildup (gingivitis).  Problems with the mouth or malformed or misaligned teeth.  Oral cancer or other diseases of the soft tissues or jaws. KEEP YOUR TEETH AND GUMS HEALTHY For healthy teeth and gums, follow these general guidelines as well as your dentist's specific advice:  Have your teeth professionally cleaned at the dentist every 6 months.  Brush twice daily with a fluoride toothpaste.  Floss your teeth daily.  Ask your dentist if you need fluoride supplements, treatments, or fluoride toothpaste.  Eat a healthy diet. Reduce foods and drinks with added sugar.  Avoid smoking. TREATMENT FOR ORAL HEALTH PROBLEMS If you have oral health problems, treatment varies depending on the conditions present in your teeth and gums.  Your caregiver will most likely recommend good oral hygiene at each visit.  For cavities, gingivitis, or other oral health disease, your caregiver will perform a procedure to treat the problem. This is typically done at a separate appointment. Sometimes your caregiver will refer you to another dental specialist for specific tooth problems or for surgery. SEEK IMMEDIATE DENTAL CARE IF:  You have pain, bleeding, or soreness in the gum, tooth, jaw, or mouth area.  A permanent tooth becomes loose or separated from the gum socket.  You experience a blow or injury to the mouth or jaw area.   This information is not intended to replace advice given to you by your health care provider. Make sure you discuss any questions you have with your  health care provider.   Document Released: 02/06/2011 Document Revised: 08/19/2011 Document Reviewed: 02/06/2011 Elsevier Interactive Patient Education 2016 [employer] Evening given a referral to a dentist please call first thing in the morning to set an appointment tell them you were referred to the emergency department they will make every effort to see in a timely manner

## 2015-03-20 NOTE — ED Notes (Signed)
Patient was alert, oriented and stable upon discharge. RN went over AVS and patient had no further questions.  

## 2015-05-03 ENCOUNTER — Encounter (HOSPITAL_COMMUNITY): Payer: Self-pay | Admitting: Emergency Medicine

## 2015-05-03 ENCOUNTER — Emergency Department (HOSPITAL_COMMUNITY)
Admission: EM | Admit: 2015-05-03 | Discharge: 2015-05-03 | Disposition: A | Discharge status: Home / Self Care | Payer: Medicaid Other | Attending: Emergency Medicine | Admitting: Emergency Medicine

## 2015-05-03 DIAGNOSIS — F17210 Nicotine dependence, cigarettes, uncomplicated: Secondary | ICD-10-CM | POA: Insufficient documentation

## 2015-05-03 DIAGNOSIS — Z793 Long term (current) use of hormonal contraceptives: Secondary | ICD-10-CM | POA: Insufficient documentation

## 2015-05-03 DIAGNOSIS — N611 Abscess of the breast and nipple: Secondary | ICD-10-CM | POA: Insufficient documentation

## 2015-05-03 DIAGNOSIS — Z8709 Personal history of other diseases of the respiratory system: Secondary | ICD-10-CM | POA: Insufficient documentation

## 2015-05-03 DIAGNOSIS — Z792 Long term (current) use of antibiotics: Secondary | ICD-10-CM | POA: Insufficient documentation

## 2015-05-03 DIAGNOSIS — Z791 Long term (current) use of non-steroidal anti-inflammatories (NSAID): Secondary | ICD-10-CM | POA: Insufficient documentation

## 2015-05-03 MED ORDER — DOXYCYCLINE HYCLATE 100 MG PO CAPS: 100.0000 mg | ORAL_CAPSULE | Freq: Two times a day (BID) | ORAL | Status: DC

## 2015-05-03 MED ORDER — HYDROCODONE-ACETAMINOPHEN 5-325 MG PO TABS
1.0000 | ORAL_TABLET | Freq: Once | ORAL | Status: AC
  Administered 2015-05-03: 1 via ORAL
  Filled 2015-05-03: qty 1

## 2015-05-03 MED ORDER — LIDOCAINE HCL (PF) 1 % IJ SOLN
20.0000 mL | Freq: Once | INTRAMUSCULAR | Status: AC
  Administered 2015-05-03: 5 mL via INTRADERMAL
  Filled 2015-05-03: qty 20

## 2015-05-03 NOTE — Discharge Instructions (Signed)
1. Medications: doxycycline, usual home medications 2. Treatment: rest, drink plenty of fluids, do warm washcloth massages to right breast x 5 minutes daily 3. Follow Up: please followup with your primary doctor for discussion of your diagnoses and further evaluation after today's visit; please followup with general surgery (Dr. Lovell SheehanJenkins) for worsening symptoms; if you do not have a primary care doctor use the resource guide provided to find one; please return to the ER for severe pain, redness, swelling, high fever, new or worsening symptoms   Emergency Department Resource Guide 1) Find a Doctor and Pay Out of Pocket Although you won't have to find out who is covered by your insurance plan, it is a good idea to ask around and get recommendations. You will then need to call the office and see if the doctor you have chosen will accept you as a new patient and what types of options they offer for patients who are self-pay. Some doctors offer discounts or will set up payment plans for their patients who do not have insurance, but you will need to ask so you aren't surprised when you get to your appointment.  2) Contact Your Local Health Department Not all health departments have doctors that can see patients for sick visits, but many do, so it is worth a call to see if yours does. If you don't know where your local health department is, you can check in your phone book. The CDC also has a tool to help you locate your state's health department, and many state websites also have listings of all of their local health departments.  3) Find a Walk-in Clinic If your illness is not likely to be very severe or complicated, you may want to try a walk in clinic. These are popping up all over the country in pharmacies, drugstores, and shopping centers. They're usually staffed by nurse practitioners or physician assistants that have been trained to treat common illnesses and complaints. They're usually fairly quick and  inexpensive. However, if you have serious medical issues or chronic medical problems, these are probably not your best option.  No Primary Care Doctor: - Call Health Connect at  [phone] - they can help you locate a primary care doctor that  accepts your insurance, provides certain services, etc. - Physician Referral Service- [phone]  Chronic Pain Problems: Organization         Address  Phone   Notes  [facility]  [phone] Patients need to be referred by their primary care doctor.   Medication Assistance: Organization         Address  Phone   Notes  [facility] Health[facility] Program [address] [place], [place] [address] [phone] --Must be a resident of [place] [place] [place] -- Must have NO insurance coverage whatsoever (no Medicaid/ Medicare, etc.) -- The pt. MUST have a primary care doctor that directs their care regularly and follows them in the community   MedAssist  [phone]   [employer]  [phone]    Agencies that provide inexpensive medical care: Organization         Address  Phone   Notes  [facility]  [phone]   [facility]    [phone]   [facility] [address] [place], [place] [address] [phone]   [facility] [address], [place] [phone]   Planned Parenthood    [phone])  [facility]    [phone]   [address], [place] Phone:  [phone], Fax:  [phone] Hours of Operation:  9 am - 6 pm, M-F.  Also accepts Medicaid/Medicare and self-pay.  [facility] for [place] [address], [address], [place] Phone: [phone], Fax: [phone]. Hours of Operation:  8:30 am - 5:30 pm, M-F.  Also accepts Medicaid and self-pay.  [facility] [place] [address], [place] Phone: [phone]   [place], [place], [place] [phone], Ext. [phone] Mondays & Thursdays: 7-9 AM.  First 15 patients are seen on a first come, first serve basis.    Eva Providers:  Organization         Address  Phone   Notes  [facility] [address], [place] [phone] Also accepts self-pay patients.  [facility] [address], [place]  [phone]   [place], [address], [place] [phone]   [facility] [address], [place] [phone]   Lucianne Lei [address], [place]   [phone] Only accepts [place] Access [place] patients after they have their name applied to their card.   Self-Pay (no insurance) in [facility]:  Organization         Address  Phone   Notes  Sickle Cell Patients, [facility] [place] [phone]   [facility] [place] [phone]   [facility] [place]  [place], [place], [place] [phone]   [facility]/Dr. Osei-Bonsu  [address], [place] or [address], [place] [phone] Phone number for both [place] and [place] locations is the same.  Urgent Medical and [facility] [address], [employer] [phone]   [facility] [address] [place] or [address] [phone]   [facility] [address] [place] [phone], phone; [phone], fax Sees patients 1st and 3rd Saturday of every month.  Must not qualify for public or private insurance (i.e. Medicaid, Medicare, [place] Health Choice, Veterans' Benefits)  Household income should be no more than 200% of the poverty level The clinic cannot treat you if you are pregnant or  think you are pregnant  Sexually transmitted diseases are not treated at the clinic.    Dental Care: Organization         Address  Phone  Notes  [facility] : [facility] Department of [facility] [place] [phone] Accepts children up to age 56 who are enrolled in [place] or [place]; pregnant women with a Medicaid card; and children who have applied for Medicaid or [place] Health Choice, but were declined, whose parents can pay a reduced fee at time of service.  [facility] Department of [facility]  [address], [place] [phone] Accepts children up to age 56 who are enrolled in [place] or [place]; pregnant women with a Medicaid card; and children who have applied for Medicaid or [place], but were declined, whose parents can pay a reduced  fee at time of service.  [facility] Access PROGRAM  [place] [phone] Patients are seen by appointment only. Walk-ins are not accepted. [place] will see patients 55 years of age and older. Monday - Tuesday (8am-5pm) Most Wednesdays (8:30-5pm) $30 per visit, cash only  [facility] Adult [facility] Access PROGRAM  [address], [facility] [phone] Patients are seen by appointment only. Walk-ins are not accepted. [place] will see patients 72 years of age and older. One Wednesday Evening (Monthly: Volunteer Based).  $30 per visit, cash only  [place]  [phone] for adults; Children under age 7, call Graduate Pediatric Dentistry at [phone]. Children aged 50-14, please call [phone] to request a pediatric application.  Dental services are provided in all areas of dental care including fillings, crowns and bridges, complete and partial dentures, implants, gum treatment, root canals, and extractions. Preventive care is also provided. Treatment is provided to both adults  and children. Patients are selected via a lottery and there is often a waiting list.   [facility] [address] [place]  [phone] [email or web address]   [facility] [address] [place], [place] [phone], Ext. [phone] Second and Fourth Thursday of each month, opens at 6:30 AM; Clinic ends at 9 AM.  Patients are seen on a first-come first-served basis, and a limited number are seen during each clinic.   [facility]  [address] Hillard [place], [place] [phone]   Eligibility Requirements You must have lived in [place], [place], or [place] counties for at least the last three months.   You cannot be eligible for state or federal sponsored [employer], including [employer], [place], or [employer].   You generally cannot be eligible for healthcare insurance through your employer.    How to apply: Eligibility screenings are held every Tuesday and Wednesday afternoon from 1:00 pm until 4:00 pm. You do not need an appointment for the interview!  [facility] [address], [place], [place]   [place]  [place] Department  [place]  [phone]    Behavioral Health Resources in the Community: Intensive Outpatient Programs Organization         Address  Phone  Notes  [place]. [address] [place], [place] [phone]   [facility] Outpatient [address] [place], [place]   ADS: Alcohol & Drug Svcs [address], [place], [place]   [place] [address]  [place], [place] or [phone]   Substance Abuse Resources Organization         Address  Phone  Notes  Alcohol and Drug Services  [phone]   [place]  [phone]   [facility] [place]     Chinita Pester  [phone]   Residential & Outpatient Substance Abuse Program  [phone]   Psychological Services Organization         Address  Phone  Notes  [facility]  [place]  [phone]   [place] [address] [place] or [phone]    Mobile Crisis Teams Organization         Address  Phone  Notes  [facility], Mobile Crisis Care Unit  [phone]  [facility]  [address] [place], [place] [phone]   [facility] [address] [place] [phone]    Self-Help/Support Groups Organization         Address  Phone             Notes  [facility]ental Health Assoc. of [place] - variety of support groups  336- I7437963 Call for more information  Narcotics Anonymous (NA), [facility] [address], [employer] [place]  2 meetings at this location   Statistician         Address  Phone  Notes  [facility] [address],    [place]  [phone]   [facility]  [address] [place] 784696, [place], [place] [phone]   [facility] [address] [place], [place] [phone] Admissions: 8am-3pm M-F  [facility] [address],    [place], [place] [phone]   [facility] [address] [place], [place], [place] [phone]   [facility] [address],  [place], [place] [phone]   [facility] - Intensive Outpatient [address], [place], [place] [phone]   [facility] (Addiction Recovery Care Assoc.) [address]  [place], [place] [phone] or [phone]   [facility] Treatment Services (RTS) [address], [place], [place] [phone] Accepts Medicaid  [facility] [place] [address],  [place] [phone] Substance Abuse/Addiction Treatment   [facility] Organization         Address  Phone  Notes  [facility]  [phone]   Angie Fava, PhD [address] Ervin Knack [place], [place]   [phone] or [phone]   [facility]   [address] [place], [place] [phone]   [facility] [address] [place], [place] [phone] Insurance/Medicaid/sponsorship through [facility] and Families [address]                                    [place], [place] [phone] Therapy/tele-psych/case  [facility] [address]Grand [place], [place] [phone]    Dr. Lolly Mustache  [phone]   [facility] [place]  United Way [facility]. 1) [address] [place] 2) [address] [place] 3)  [address], [place] [phone]  [phone]   [facility] Child Abuse Hotline (678) [phone] or [phone] (After Hours)

## 2015-05-03 NOTE — Care Management (Signed)
                  Dear Ms. Mayers:  You have been approved to have your discharge prescriptions filled through our [facility] (Medication Assistance Through [facility]) program. This program allows for a one-time (no refills) 34-day supply of selected medications for a low copay amount.  The copay is $3.00 per prescription. For instance, if you have one prescription, you will pay $3.00; for two prescriptions, you pay $6.00; for three prescriptions, you pay $9.00; and so on.  Only certain pharmacies are participating in this program with [facility]. You will need to select one of the pharmacies from the attached list and take your prescriptions, this letter, and your photo ID to one of the participating pharmacies.   We are excited that you are able to use the [facility] program to get your medications. These prescriptions must be filled within 7 days of hospital discharge or they will no longer be valid for the [facility] program. Should you have any problems with your prescriptions please contact your case management team member at [phone].  Thank you,   [facility]   Participating [facility] Pharmacies  [facility] . [facility] [address] [place], [place] . [facility] [address] [place], [place] . [facility] [address], [address] [place], [place]   [facility] . [address], [place], [place] . [id number], [place], [place] . [address], [place], [place] . [address], [place], [place] . [address], [place], [place] . [address], [place], [place] . [address], [place], [place] . [address], [place], [place] . [address], [place], [place] . [address] [place], [place], [place]  [facility] . [address], [place], [place] . [address] [place], [place] . [address],  [place], [place] . 4424 [address], [place], [place] . [address], [place], [place] . [address] [place] [address], [place], [place] [facility] . [address], [place], [place] . [address], [place], [place] . [address]01 [address], [place], [place] . [address], [place], [place] . [address]29 [address], [place], [place] . [address], [place], [place] . 1600 [address], [place], [place] . 300 [address], [place], [place] . [phone] [address], [place], [place] . 904 [address], [place], [place] . 2758 [address], [place], [place] . [address], [place], [place] . [address], [place], [place] . [phone] Sullivan[address], [place], [place]  Independent Pharmacies . [facility] [address] F[address] CityA[address], [address] [place], [place] . [facility] [address] [place], [place] . [facility] [address] [place], [place]   For continued medication needs, please contact the [facility] at  [phone].

## 2015-05-03 NOTE — ED Notes (Signed)
Pt c/o right breast pain next to the nipple; pt states her nipple became inverted about a year ago when her mammogram showed a mass; cancer has been ruled out; mass near nipple became increasingly painful 2 days ago.

## 2015-05-03 NOTE — ED Notes (Signed)
PA at bedside.

## 2015-05-03 NOTE — ED Provider Notes (Signed)
CSN: [id number]     Arrival date & time 05/03/15  1617 History  By signing my name below, I, Doreatha Martinva Mathews, attest that this documentation has been prepared under the direction and in the presence of Mady GemmaElizabeth C Wagner Tanzi, PA-C. Electronically Signed: Doreatha MartinEva Mathews, ED Scribe. 05/03/2015. 4:43 PM.    Chief Complaint  Patient presents with  . Breast Pain    The history is provided by the patient. No language interpreter was used.    HPI Comments: Meghan Pruitt is a 34 y.o. female with no pertinent PMH who presents to the Emergency Department complaining of moderate, gradually worsening aching pain proximal to the right nipple onset 2 days ago with associated swelling onset yesterday. She reports moderate relief of symptoms with heating pad, tylenol, and ibuprofen. She states she is not breastfeeding. She notes a year ago, her nipple was inverted and she had a mammogram that showed a mass, but her work-up at that time was negative for malignancy. She denies nipple drainage, fever, chills, abdominal pain, nausea, vomiting.    Past Medical History  Diagnosis Date  . Chronic bronchitis (HCC)    History reviewed. No pertinent past surgical history. Family History  Problem Relation Age of Onset  . Heart disease Maternal Grandfather   . Heart disease Paternal Grandmother    Social History  Substance Use Topics  . Smoking status: Current Every Day Smoker    Types: Cigarettes  . Smokeless tobacco: Current User  . Alcohol Use: Yes     Comment: occasionally   OB History    Gravida Para Term Preterm AB TAB SAB Ectopic Multiple Living   2 2 2       2       Review of Systems  Constitutional: Negative for fever and chills.  Gastrointestinal: Negative for nausea, vomiting and abdominal pain.  Skin:       Area of pain and swelling near the right nipple. No drainage.     Allergies  Review of patient's allergies indicates no known allergies.  Home Medications   Prior to Admission  medications   Medication Sig Start Date End Date Taking? Authorizing Provider  dextromethorphan (DELSYM) 30 MG/5ML liquid Take 5 mLs (30 mg total) by mouth as needed for cough. 10/29/14   Emilia BeckKaitlyn Szekalski, PA-C  doxycycline (VIBRAMYCIN) 100 MG capsule Take 1 capsule (100 mg total) by mouth 2 (two) times daily. 05/03/15   Mady GemmaElizabeth C Kyran Whittier, PA-C  etonogestrel (IMPLANON) 68 MG IMPL implant Inject 1 each into the skin continuous.    Historical Provider, MD  ibuprofen (ADVIL,MOTRIN) 600 MG tablet Take 600 mg by mouth every 6 (six) hours as needed for headache or moderate pain.    Historical Provider, MD  meloxicam (MOBIC) 15 MG tablet Take 1 tablet (15 mg total) by mouth daily. 10/29/14   Kaitlyn Szekalski, PA-C  naproxen (NAPROSYN) 500 MG tablet Take 1 tablet (500 mg total) by mouth 2 (two) times daily with a meal. Patient not taking: Reported on 10/29/2014 06/03/14   Toy CookeyMegan Docherty, MD  oxyCODONE-acetaminophen (PERCOCET) 5-325 MG per tablet Take 1 tablet by mouth every 6 (six) hours as needed. Patient taking differently: Take 1 tablet by mouth once.  06/03/14   Toy CookeyMegan Docherty, MD  penicillin v potassium (VEETID) 500 MG tablet Take 1 tablet (500 mg total) by mouth 4 (four) times daily. 03/20/15   Earley FavorGail Schulz, NP  Phenylephrine-Pheniramine-DM [facility](THERAFLU COLD & COUGH PO) Take 1 Dose by mouth every 6 (six) hours as needed (cold  symptoms).    Historical Provider, MD  traMADol (ULTRAM) 50 MG tablet Take 1 tablet (50 mg total) by mouth every 6 (six) hours as needed. 03/20/15   Earley Favor, NP    BP 119/73 mmHg  Pulse 96  Temp(Src) 97.7 F (36.5 C) (Oral)  Resp 20  SpO2 99%  LMP 04/23/2015 Physical Exam  Constitutional: She is oriented to person, place, and time. She appears well-developed and well-nourished. No distress.  HENT:  Head: Normocephalic and atraumatic.  Right Ear: External ear normal.  Left Ear: External ear normal.  Nose: Nose normal.  Eyes: Conjunctivae and EOM are normal. Right  eye exhibits no discharge. Left eye exhibits no discharge. No scleral icterus.  Neck: Normal range of motion. Neck supple.  Cardiovascular: Normal rate and regular rhythm.   Pulmonary/Chest: Effort normal and breath sounds normal. No respiratory distress. Right breast exhibits mass, skin change and tenderness. Right breast exhibits no inverted nipple and no nipple discharge. Left breast exhibits no inverted nipple, no mass, no nipple discharge, no skin change and no tenderness.    Musculoskeletal: Normal range of motion. She exhibits no edema or tenderness.  Neurological: She is alert and oriented to person, place, and time.  Skin: Skin is warm and dry. She is not diaphoretic.  Psychiatric: She has a normal mood and affect. Her behavior is normal.  Nursing note and vitals reviewed.   ED Course  .Marland KitchenIncision and Drainage Date/Time: 05/03/2015 5:27 PM Performed by: Glean Hess C Authorized by: Glean Hess C Consent: Verbal consent obtained. Risks and benefits: risks, benefits and alternatives were discussed Consent given by: patient Patient understanding: patient states understanding of the procedure being performed Patient consent: the patient's understanding of the procedure matches consent given Procedure consent: procedure consent matches procedure scheduled Relevant documents: relevant documents present and verified Site marked: the operative site was marked Required items: required blood products, implants, devices, and special equipment available Patient identity confirmed: verbally with patient Time out: Immediately prior to procedure a "time out" was called to verify the correct patient, procedure, equipment, support staff and site/side marked as required. Type: abscess Body area: trunk Location details: right breast Anesthesia: local infiltration Local anesthetic: lidocaine 1% without epinephrine Anesthetic total: 2 ml Patient sedated: no Needle gauge:  18 Complexity: complex Drainage: purulent Drainage amount: moderate Wound treatment: wound left open Packing material: none Patient tolerance: Patient tolerated the procedure well with no immediate complications   DIAGNOSTIC STUDIES: Oxygen Saturation is 99% on RA, normal by my interpretation.    COORDINATION OF CARE: 4:39 PM Discussed treatment plan with pt at bedside and pt agreed to plan.   MDM   Final diagnoses:  Breast abscess    34 year old female presents with right breast pain and abscess lateral to her right nipple x 2 days. Denies nipple drainage, fever, chills, abdominal pain, nausea, vomiting. Patient is afebrile. Vital signs stable. Small area of induration and fluctuance lateral to right nipple with associated TTP. No significant surrounding erythema. No discharge.  Surgery consulted given location of abscess. Spoke with general surgery, who advised to aspirate abscess in the ED and treat patient with doxycycline BID x 10 days. Also advised to do warm washcloth massages daily for the next 3-4 days. If patient improves, she can follow up with PCP. With persistent or worsening symptoms, patient to follow-up with general surgery.   Abscess drained, which the patient tolerated well. Patient to follow up as above. Return precautions discussed. Patient verbalizes her understanding  and is in agreement with plan.  BP 119/73 mmHg  Pulse 96  Temp(Src) 97.7 F (36.5 C) (Oral)  Resp 20  SpO2 99%  LMP 04/23/2015  I personally performed the services described in this documentation, which was scribed in my presence. The recorded information has been reviewed and is accurate.   Mady Gemma, PA-C 05/03/15 1733  Dione Booze, MD 05/03/15 2248

## 2015-05-03 NOTE — Progress Notes (Signed)
CM received call from patient stating that she was attempting to get RX for doxycycline abx and can't afford medication. Cm reviewed and [facility] CM explained MATCH program to patient. [facility] [address] [place]. CM called to confirm receipt of MATCH letter. No further needs identified by patient.

## 2015-09-29 ENCOUNTER — Emergency Department (HOSPITAL_COMMUNITY)
Admission: EM | Admit: 2015-09-29 | Discharge: 2015-09-29 | Disposition: A | Discharge status: Home / Self Care | Payer: Medicaid Other | Attending: Emergency Medicine | Admitting: Emergency Medicine

## 2015-09-29 ENCOUNTER — Encounter (HOSPITAL_COMMUNITY): Payer: Self-pay | Admitting: *Deleted

## 2015-09-29 ENCOUNTER — Emergency Department (HOSPITAL_COMMUNITY): Payer: Medicaid Other

## 2015-09-29 DIAGNOSIS — F17210 Nicotine dependence, cigarettes, uncomplicated: Secondary | ICD-10-CM | POA: Insufficient documentation

## 2015-09-29 DIAGNOSIS — Z79899 Other long term (current) drug therapy: Secondary | ICD-10-CM | POA: Insufficient documentation

## 2015-09-29 DIAGNOSIS — R05 Cough: Secondary | ICD-10-CM | POA: Insufficient documentation

## 2015-09-29 DIAGNOSIS — R0789 Other chest pain: Secondary | ICD-10-CM | POA: Insufficient documentation

## 2015-09-29 DIAGNOSIS — J029 Acute pharyngitis, unspecified: Secondary | ICD-10-CM | POA: Insufficient documentation

## 2015-09-29 DIAGNOSIS — R059 Cough, unspecified: Secondary | ICD-10-CM

## 2015-09-29 DIAGNOSIS — M79661 Pain in right lower leg: Secondary | ICD-10-CM | POA: Insufficient documentation

## 2015-09-29 DIAGNOSIS — Z792 Long term (current) use of antibiotics: Secondary | ICD-10-CM | POA: Insufficient documentation

## 2015-09-29 DIAGNOSIS — Z791 Long term (current) use of non-steroidal anti-inflammatories (NSAID): Secondary | ICD-10-CM | POA: Insufficient documentation

## 2015-09-29 LAB — I-STAT CHEM 8, ED
BUN: 8 mg/dL (ref 6–20)
Calcium, Ion: 1.14 mmol/L (ref 1.12–1.23)
Chloride: 104 mmol/L (ref 101–111)
Creatinine, Ser: 1.1 mg/dL — ABNORMAL HIGH (ref 0.44–1.00)
GLUCOSE: 99 mg/dL (ref 65–99)
HCT: 46 % (ref 36.0–46.0)
HEMOGLOBIN: 15.6 g/dL — AB (ref 12.0–15.0)
POTASSIUM: 3.5 mmol/L (ref 3.5–5.1)
Sodium: 142 mmol/L (ref 135–145)
TCO2: 25 mmol/L (ref 0–100)

## 2015-09-29 LAB — D-DIMER, QUANTITATIVE (NOT AT ARMC): D DIMER QUANT: 0.27 ug{FEU}/mL (ref 0.00–0.50)

## 2015-09-29 LAB — CBC WITH DIFFERENTIAL/PLATELET
BASOS ABS: 0 10*3/uL (ref 0.0–0.1)
Basophils Relative: 0 %
EOS PCT: 3 %
Eosinophils Absolute: 0.4 10*3/uL (ref 0.0–0.7)
HCT: 42.9 % (ref 36.0–46.0)
Hemoglobin: 14.3 g/dL (ref 12.0–15.0)
LYMPHS ABS: 2.6 10*3/uL (ref 0.7–4.0)
Lymphocytes Relative: 19 %
MCH: 31.5 pg (ref 26.0–34.0)
MCHC: 33.3 g/dL (ref 30.0–36.0)
MCV: 94.5 fL (ref 78.0–100.0)
MONO ABS: 1 10*3/uL (ref 0.1–1.0)
Monocytes Relative: 7 %
Neutro Abs: 9.8 10*3/uL — ABNORMAL HIGH (ref 1.7–7.7)
Neutrophils Relative %: 71 %
PLATELETS: 277 10*3/uL (ref 150–400)
RBC: 4.54 MIL/uL (ref 3.87–5.11)
RDW: 12.9 % (ref 11.5–15.5)
WBC: 13.8 10*3/uL — AB (ref 4.0–10.5)

## 2015-09-29 LAB — RAPID STREP SCREEN (MED CTR MEBANE ONLY): STREPTOCOCCUS, GROUP A SCREEN (DIRECT): NEGATIVE

## 2015-09-29 MED ORDER — IPRATROPIUM-ALBUTEROL 0.5-2.5 (3) MG/3ML IN SOLN
3.0000 mL | Freq: Once | RESPIRATORY_TRACT | Status: AC
  Administered 2015-09-29: 3 mL via RESPIRATORY_TRACT
  Filled 2015-09-29: qty 3

## 2015-09-29 MED ORDER — HYDROCOD POLST-CPM POLST ER 10-8 MG/5ML PO SUER: 5.0000 mL | Freq: Two times a day (BID) | ORAL | Status: DC | PRN

## 2015-09-29 MED ORDER — ALBUTEROL SULFATE HFA 108 (90 BASE) MCG/ACT IN AERS: 1.0000 | INHALATION_SPRAY | Freq: Four times a day (QID) | RESPIRATORY_TRACT | Status: DC | PRN

## 2015-09-29 NOTE — ED Notes (Signed)
Pt states cough and sore throat x 3 days and last night she began having chest pains with the cough.

## 2015-09-29 NOTE — Discharge Instructions (Signed)
Follow up with your primary care provider in 2 days.  Return to the emergency department if you experience shortness of breath, worsening chest pain, high fevers, dizziness.   Cough, Adult A cough helps to clear your throat and lungs. A cough may last only 2-3 weeks (acute), or it may last longer than 8 weeks (chronic). Many different things can cause a cough. A cough may be a sign of an illness or another medical condition. HOME CARE  Pay attention to any changes in your cough.  Take medicines only as told by your doctor.  If you were prescribed an antibiotic medicine, take it as told by your doctor. Do not stop taking it even if you start to feel better.  Talk with your doctor before you try using a cough medicine.  Drink enough fluid to keep your pee (urine) clear or pale yellow.  If the air is dry, use a cold steam vaporizer or humidifier in your home.  Stay away from things that make you cough at work or at home.  If your cough is worse at night, try using extra pillows to raise your head up higher while you sleep.  Do not smoke, and try not to be around smoke. If you need help quitting, ask your doctor.  Do not have caffeine.  Do not drink alcohol.  Rest as needed. GET HELP IF:  You have new problems (symptoms).  You cough up yellow fluid (pus).  Your cough does not get better after 2-3 weeks, or your cough gets worse.  Medicine does not help your cough and you are not sleeping well.  You have pain that gets worse or pain that is not helped with medicine.  You have a fever.  You are losing weight and you do not know why.  You have night sweats. GET HELP RIGHT AWAY IF:  You cough up blood.  You have trouble breathing.  Your heartbeat is very fast.   This information is not intended to replace advice given to you by your health care provider. Make sure you discuss any questions you have with your health care provider.   Document Released: 02/07/2011  Document Revised: 02/15/2015 Document Reviewed: 08/03/2014 Elsevier Interactive Patient Education [employer]2016 Elsevier Inc.

## 2015-09-29 NOTE — Progress Notes (Signed)
[facility].  Patient discharged with RX for Tussinex and ventolin inhaler.  [facility].  Patient reports she wants to find another pcp.  EDCM received patient's permission to contact [facility] to obtain an appointment to establish care and speak to financial counselor in regards to the orange card.  [facility] [address] [facility] patient request for ventolin inhaler.  [facility] willing to assist patient with the cost of inhaler.  EDCM called patient back with the above information.  No further EDCM needs at this time.

## 2015-09-29 NOTE — ED Notes (Signed)
Started 3 days ago with sore throat, cough, green-yellow sputum. Also, c/o chest pain, "like it's tying up in a knot". Denies N/V or radiation. No significant SOB.

## 2015-09-29 NOTE — ED Provider Notes (Signed)
CSN: [id number]     Arrival date & time 09/29/15  0913 History  By signing my name below, I, Ronney LionSuzanne Le, attest that this documentation has been prepared under the direction and in the presence of Mattie MarlinJessica Makyra Corprew, PA-C. Electronically Signed: Ronney LionSuzanne Le, ED Scribe. 09/29/2015. 10:24 AM.    Chief Complaint  Patient presents with  . Cough   The history is provided by the patient. No language interpreter was used.    HPI Comments: Meghan Pruitt is a 35 y.o. female with a history of chronic bronchitis, who presents to the Emergency Department complaining of intermittent, generalized, chest pain that she characterizes as like "a knot in some area" and "twisting and turning" that begin last night while at rest. She states the chest pain is triggered "at random' and is not exertional, associated with her cough or breathing. Patient states her right calf started aching last night around the same time, and is worse with walking; she also notes she had a headache last night that resolved with sleep. This is a new pain. She states she developed URI-like symptoms 5 days ago, including a cough with intermittently productive with yellow sputum, sore throat worse with coughing and swallowing, congestion, rhinorrhea, and chills. She states her symptoms seemed to improve 2 days ago but worsened again yesterday. She states the chest pain does not seem to be associated with her cough. She notes a history of smoking and reports she has an Implanon in place although she was supposed to have it removed 2 years ago. She denies any recent travel or recent surgery. She also denies a history of blood clots or blood clotting disorder, estrogen use, cardiac conditions, or DM. Patient states she had been taking Dayquil and Theraflu with minimal relief. 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 She denies SOB, dizziness, syncope, blurred vision, double vision, new tinnitus, postnasal drip, extremity numbness or tingling,  weakness, abdominal pain, nausea, vomiting, fever, night sweats, diarrhea, or blood in her stool.  Past Medical History  Diagnosis Date  . Chronic bronchitis (HCC)    History reviewed. No pertinent past surgical history. Family History  Problem Relation Age of Onset  . Heart disease Maternal Grandfather   . Heart disease Paternal Grandmother    Social History  Substance Use Topics  . Smoking status: Current Every Day Smoker -- 0.50 packs/day    Types: Cigarettes  . Smokeless tobacco: Current User  . Alcohol Use: Yes     Comment: occasionally   OB History    Gravida Para Term Preterm AB TAB SAB Ectopic Multiple Living   2 2 2       2      Review of Systems  Constitutional: Positive for chills. Negative for fever and diaphoresis.  HENT: Positive for congestion, rhinorrhea and sore throat. Negative for postnasal drip and tinnitus.   Eyes: Negative for visual disturbance.  Respiratory: Positive for cough. Negative for shortness of breath.   Cardiovascular: Positive for chest pain. Negative for leg swelling.  Gastrointestinal: Negative for nausea, vomiting, abdominal pain, diarrhea and blood in stool.  Musculoskeletal: Positive for myalgias (right calf pain).  Neurological: Negative for dizziness, syncope, weakness and numbness.  All other systems reviewed and are negative.     Allergies  Review of patient's allergies indicates no known allergies.  Home Medications   Prior to Admission medications   Medication Sig Start Date End Date Taking? Authorizing Provider  albuterol (PROVENTIL HFA;VENTOLIN HFA) 108 (90 Base) MCG/ACT  inhaler Inhale 1-2 puffs into the lungs every 6 (six) hours as needed for wheezing or shortness of breath. 09/29/15   Trixie Dredge, PA-C  chlorpheniramine-HYDROcodone [facility]) 10-8 MG/5ML SUER Take 5 mLs by mouth every 12 (twelve) hours as needed for cough (and pain). 09/29/15   Trixie Dredge, PA-C  dextromethorphan (DELSYM) 30 MG/5ML liquid  Take 5 mLs (30 mg total) by mouth as needed for cough. 10/29/14   Emilia Beck, PA-C  doxycycline (VIBRAMYCIN) 100 MG capsule Take 1 capsule (100 mg total) by mouth 2 (two) times daily. 05/03/15   Mady Gemma, PA-C  etonogestrel (IMPLANON) 68 MG IMPL implant Inject 1 each into the skin continuous.    Historical Provider, MD  ibuprofen (ADVIL,MOTRIN) 600 MG tablet Take 600 mg by mouth every 6 (six) hours as needed for headache or moderate pain.    Historical Provider, MD  meloxicam (MOBIC) 15 MG tablet Take 1 tablet (15 mg total) by mouth daily. 10/29/14   Kaitlyn Szekalski, PA-C  naproxen (NAPROSYN) 500 MG tablet Take 1 tablet (500 mg total) by mouth 2 (two) times daily with a meal. Patient not taking: Reported on 10/29/2014 06/03/14   Toy Cookey, MD  oxyCODONE-acetaminophen (PERCOCET) 5-325 MG per tablet Take 1 tablet by mouth every 6 (six) hours as needed. Patient taking differently: Take 1 tablet by mouth once.  06/03/14   Toy Cookey, MD  penicillin v potassium (VEETID) 500 MG tablet Take 1 tablet (500 mg total) by mouth 4 (four) times daily. 03/20/15   Earley Favor, NP  Phenylephrine-Pheniramine-DM [facility] COLD & COUGH PO) Take 1 Dose by mouth every 6 (six) hours as needed (cold symptoms).    Historical Provider, MD  traMADol (ULTRAM) 50 MG tablet Take 1 tablet (50 mg total) by mouth every 6 (six) hours as needed. 03/20/15   Earley Favor, NP   BP 140/100 mmHg  Pulse 99  Temp(Src) 97.7 F (36.5 C) (Oral)  Resp 18  Ht  (1.626 m)  Wt 200 lb (90.719 kg)  BMI 34.31 kg/m2  SpO2 98%  LMP 09/13/2015 Physical Exam  Constitutional: She appears well-developed and well-nourished. No distress.  HENT:  Head: Normocephalic and atraumatic.  Right Ear: External ear and ear canal normal.  Left Ear: External ear and ear canal normal.  Mouth/Throat: Posterior oropharyngeal erythema present. No oropharyngeal exudate.  MM moist. Oropharynx with erythema, no edema.   Eyes:  Conjunctivae are normal.  Neck: Neck supple.  No cervical lymphadenopathy.  Cardiovascular: Normal rate, regular rhythm and normal heart sounds.   Pulmonary/Chest: Effort normal and breath sounds normal. No respiratory distress. She has no wheezes. She has no rales. She exhibits tenderness.  Reproducible chest wall pain with palpation, although patient states it is "different from" the intermittent chest pain she is complaining of.   Abdominal: Soft. There is no tenderness.  Musculoskeletal: Normal range of motion. She exhibits no edema.       Right lower leg: She exhibits tenderness.  DP pulses 2+. No edema. Right calf tenderness.  Lymphadenopathy:    She has no cervical adenopathy.  Neurological: She is alert. Coordination normal.  Skin: Skin is warm and dry. No rash noted. She is not diaphoretic.  Psychiatric: She has a normal mood and affect. Her behavior is normal.  Nursing note and vitals reviewed.   ED Course  Procedures (including critical care time)  DIAGNOSTIC STUDIES: Oxygen Saturation is 98% on RA, normal by my interpretation.    COORDINATION OF CARE: 9:25  AM - Discussed treatment plan with pt at bedside. Pt verbalized understanding and agreed to plan.   Labs Review Labs Reviewed  CBC WITH DIFFERENTIAL/PLATELET - Abnormal; Notable for the following:    WBC 13.8 (*)    Neutro Abs 9.8 (*)    All other components within normal limits  I-STAT CHEM 8, ED - Abnormal; Notable for the following:    Creatinine, Ser 1.10 (*)    Hemoglobin 15.6 (*)    All other components within normal limits  RAPID STREP SCREEN (NOT AT [facility])  CULTURE, GROUP A STREP [facility])  D-DIMER, QUANTITATIVE (NOT AT [facility])    Imaging Review Dg Chest 2 View  09/29/2015  CLINICAL DATA:  Cough for 4 days EXAM: CHEST  2 VIEW COMPARISON:  Oct 29, 2014 FINDINGS: Lungs are clear. Heart size and pulmonary vascularity are normal. No adenopathy. No bone lesions. IMPRESSION: No edema or consolidation.  Electronically Signed   By: Bretta Bang III M.D.   On: 09/29/2015 10:42   I have personally reviewed and evaluated these images and lab results as part of my medical decision-making.   EKG Interpretation None     <ECG>  ED ECG REPORT   Date: 09/29/2015  Rate: 91  Rhythm: normal sinus rhythm  QRS Axis: normal  Intervals: normal  ST/T Wave abnormalities: normal  Conduction Disutrbances:none  Narrative Interpretation:   Old EKG Reviewed: none available  I have personally reviewed the EKG tracing and agree with the computerized printout as noted.   MDM   Final diagnoses:  Cough  Atypical chest pain   Pt states her cough and chest pain has improved after the neb treatment.   Patient is to be discharged with recommendation to follow up with PCP in regards to today's hospital visit. Chest pain is not likely of cardiac or pulmonary etiology d/t presentation, perc negative, d-dimer negative, VSS, no tracheal deviation, no new murmur, RRR, breath sounds equal bilaterally, EKG without acute abnormalities and negative CXR. It is likely the chest pain is related to the cough since chest pain and cough improved with neb treatment. Pt has been advised to return to the ED if CP becomes exertional, associated with diaphoresis or nausea, radiates to left jaw/arm, worsens or becomes concerning in any way. Pt appears reliable for follow up and is agreeable to discharge.  WBC slightly elevated likely due to URI. Serum creatinine slightly elevated, will see PCP for follow and repeat labs. BP was found to be 140/100 on arrival but was 127/80 at time of discharge.   Discharged pt with albuterol neb and Tussenex. Discussed strict return precautions. I discussed all of the results with the patient and family members and they have expressed their understanding to the verbal discharge instructions.   I personally performed the services described in this documentation, which was scribed in my  presence. The recorded information has been reviewed and is accurate.        Jerre Simon, PA 09/29/15 1426  Jerre Simon, PA 09/29/15 1431  Melene Plan, DO 09/29/15 1535  Melene Plan, DO 09/29/15 1535

## 2015-10-01 LAB — CULTURE, GROUP A STREP (THRC)

## 2015-10-02 ENCOUNTER — Telehealth: Payer: Self-pay

## 2015-10-02 NOTE — Telephone Encounter (Signed)
Message received from Radford PaxAmy Ferrero, RN CM requesting [facility]. Call placed to # [phone] (H) three times  and the phone rang and rang and then the call ended.   Update provided to A. Bennie DallasFerrero, RN CM

## 2015-10-03 ENCOUNTER — Telehealth: Payer: Self-pay

## 2015-10-03 NOTE — Telephone Encounter (Signed)
Attempted to contact the patient again to discuss scheduling a hospital follow up appointment. Call placed to #[phone] (H) x2 and the phone rang 8-9 times each call and then the call ended. Unable to leave a message.

## 2015-10-09 ENCOUNTER — Telehealth: Payer: Self-pay

## 2015-10-09 NOTE — Telephone Encounter (Signed)
Attempted to contact the patient to discuss scheduling a hospital follow up appointment. Call placed to # [phone] (H) and the phone rang 7 times and then ended.  No option to leave a message.

## 2015-10-11 ENCOUNTER — Telehealth: Payer: Self-pay

## 2015-10-11 NOTE — Telephone Encounter (Signed)
Attempted to contact the patient again to [facility] Call placed to #[phone] and the phone rang 4 times and then stopped ringing. Unable to leave a message.

## 2016-06-10 NOTE — L&D Delivery Note (Signed)
Patient is a 36 y.o. now [id number] who admitted for PDIOL, s/p IOL with Pitocin and AROM, now s/p NSVD at [redacted]w[redacted]d.  Delivery Note At 5:34 PM a viable female was delivered via Vaginal, Spontaneous Delivery  Presentation: ROA.   APGAR: 9, 9 Weight: pending Placenta status: intact; to L&D.   Cord:  3-vessel  Anesthesia:  Epidural Episiotomy: None Lacerations: None Suture Repair: none Est. Blood Loss (mL): 100  Head delivered ROA. No nuchal cord present. Shoulder and body delivered in usual fashion. Infant with spontaneous cry; to mother's abdomen, dried bulb suctioned. Cord clamped x 2 after 1-minute delay, and cut by family member. Cord blood drawn. Placenta delivered spontaneously with gentle cord traction. Fundus firm with massage and Pitocin. Perineum inspected and found to have no lacerations.  Mom to postpartum.  Baby to Couplet care / Skin to Skin.  Raynelle Fanning P. Degele, MD OB Fellow 01/26/17, 5:49 PM

## 2016-06-18 ENCOUNTER — Inpatient Hospital Stay (HOSPITAL_COMMUNITY): Payer: Self-pay

## 2016-06-18 ENCOUNTER — Inpatient Hospital Stay (HOSPITAL_COMMUNITY)
Admission: AD | Admit: 2016-06-18 | Discharge: 2016-06-18 | Disposition: A | Discharge status: Home / Self Care | Payer: Medicaid Other | Source: Ambulatory Visit | Attending: Obstetrics & Gynecology | Admitting: Obstetrics & Gynecology

## 2016-06-18 ENCOUNTER — Encounter (HOSPITAL_COMMUNITY): Payer: Self-pay | Admitting: *Deleted

## 2016-06-18 DIAGNOSIS — O99281 Endocrine, nutritional and metabolic diseases complicating pregnancy, first trimester: Secondary | ICD-10-CM | POA: Insufficient documentation

## 2016-06-18 DIAGNOSIS — O99331 Smoking (tobacco) complicating pregnancy, first trimester: Secondary | ICD-10-CM | POA: Insufficient documentation

## 2016-06-18 DIAGNOSIS — Z3A09 9 weeks gestation of pregnancy: Secondary | ICD-10-CM | POA: Insufficient documentation

## 2016-06-18 DIAGNOSIS — R112 Nausea with vomiting, unspecified: Secondary | ICD-10-CM

## 2016-06-18 DIAGNOSIS — O26611 Liver and biliary tract disorders in pregnancy, first trimester: Secondary | ICD-10-CM | POA: Insufficient documentation

## 2016-06-18 DIAGNOSIS — E860 Dehydration: Secondary | ICD-10-CM

## 2016-06-18 DIAGNOSIS — F17210 Nicotine dependence, cigarettes, uncomplicated: Secondary | ICD-10-CM | POA: Insufficient documentation

## 2016-06-18 DIAGNOSIS — O26619 Liver and biliary tract disorders in pregnancy, unspecified trimester: Secondary | ICD-10-CM

## 2016-06-18 DIAGNOSIS — O219 Vomiting of pregnancy, unspecified: Secondary | ICD-10-CM | POA: Insufficient documentation

## 2016-06-18 DIAGNOSIS — K760 Fatty (change of) liver, not elsewhere classified: Secondary | ICD-10-CM | POA: Insufficient documentation

## 2016-06-18 LAB — CBC WITH DIFFERENTIAL/PLATELET
BASOS ABS: 0 10*3/uL (ref 0.0–0.1)
Basophils Relative: 0 %
EOS ABS: 0.2 10*3/uL (ref 0.0–0.7)
Eosinophils Relative: 1 %
HEMATOCRIT: 39.8 % (ref 36.0–46.0)
HEMOGLOBIN: 13.8 g/dL (ref 12.0–15.0)
Lymphocytes Relative: 12 %
Lymphs Abs: 2.2 10*3/uL (ref 0.7–4.0)
MCH: 31.6 pg (ref 26.0–34.0)
MCHC: 34.7 g/dL (ref 30.0–36.0)
MCV: 91.1 fL (ref 78.0–100.0)
Monocytes Absolute: 1.1 10*3/uL — ABNORMAL HIGH (ref 0.1–1.0)
Monocytes Relative: 6 %
NEUTROS PCT: 81 %
Neutro Abs: 15.2 10*3/uL — ABNORMAL HIGH (ref 1.7–7.7)
Platelets: 258 10*3/uL (ref 150–400)
RBC: 4.37 MIL/uL (ref 3.87–5.11)
RDW: 12.8 % (ref 11.5–15.5)
WBC: 18.8 10*3/uL — AB (ref 4.0–10.5)

## 2016-06-18 LAB — COMPREHENSIVE METABOLIC PANEL
ALBUMIN: 4.2 g/dL (ref 3.5–5.0)
ALK PHOS: 56 U/L (ref 38–126)
ALT: 18 U/L (ref 14–54)
ANION GAP: 11 (ref 5–15)
AST: 23 U/L (ref 15–41)
BUN: 10 mg/dL (ref 6–20)
CALCIUM: 9.7 mg/dL (ref 8.9–10.3)
CO2: 20 mmol/L — AB (ref 22–32)
CREATININE: 0.72 mg/dL (ref 0.44–1.00)
Chloride: 103 mmol/L (ref 101–111)
GFR calc Af Amer: >60 mL/min (ref >60)
GFR calc non Af Amer: >60 mL/min (ref >60)
GLUCOSE: 101 mg/dL — AB (ref 65–99)
Potassium: 4.6 mmol/L (ref 3.5–5.1)
SODIUM: 134 mmol/L — AB (ref 135–145)
Total Bilirubin: 1.3 mg/dL — ABNORMAL HIGH (ref 0.3–1.2)
Total Protein: 7.3 g/dL (ref 6.5–8.1)

## 2016-06-18 LAB — URINALYSIS, ROUTINE W REFLEX MICROSCOPIC
Bacteria, UA: NONE SEEN
Bilirubin Urine: NEGATIVE
Glucose, UA: NEGATIVE mg/dL
Hgb urine dipstick: NEGATIVE
KETONES UR: 5 mg/dL — AB
Leukocytes, UA: NEGATIVE
Nitrite: NEGATIVE
PH: 5 (ref 5.0–8.0)
PROTEIN: 30 mg/dL — AB
Specific Gravity, Urine: 1.028 (ref 1.005–1.030)

## 2016-06-18 LAB — LIPASE, BLOOD: Lipase: 22 U/L (ref 11–51)

## 2016-06-18 LAB — POCT PREGNANCY, URINE: Preg Test, Ur: POSITIVE — AB

## 2016-06-18 LAB — TSH: TSH: 0.499 u[IU]/mL (ref 0.350–4.500)

## 2016-06-18 MED ORDER — PROMETHAZINE HCL 12.5 MG PO TABS: 12.5000 mg | ORAL_TABLET | Freq: Four times a day (QID) | ORAL | 0 refills | Status: DC | PRN

## 2016-06-18 MED ORDER — LACTATED RINGERS IV BOLUS (SEPSIS)
1000.0000 mL | Freq: Once | INTRAVENOUS | Status: AC
  Administered 2016-06-18: 1000 mL via INTRAVENOUS

## 2016-06-18 MED ORDER — SODIUM CHLORIDE 0.9 % IV SOLN
8.0000 mg | Freq: Once | INTRAVENOUS | Status: AC
  Administered 2016-06-18: 8 mg via INTRAVENOUS
  Filled 2016-06-18: qty 4

## 2016-06-18 MED ORDER — LACTATED RINGERS IV SOLN
25.0000 mg | Freq: Once | INTRAVENOUS | Status: AC
  Administered 2016-06-18: 25 mg via INTRAVENOUS
  Filled 2016-06-18: qty 1

## 2016-06-18 MED ORDER — PRENATAL PLUS 27-1 MG PO TABS: 1.0000 | ORAL_TABLET | Freq: Every day | ORAL | 0 refills | Status: DC

## 2016-06-18 NOTE — MAU Provider Note (Signed)
MAU HISTORY AND PHYSICAL  Chief Complaint:  Nausea; Generalized Body Aches; and Emesis   Meghan Pruitt is a 36 y.o.  G3P2002  at 557w2d presenting for Nausea; Generalized Body Aches; and Emesis  Patient has not yet established care this pregnancy, planning on [facility].  Has been nauseaus for a week or so, but today awoke with lots of nausea and vomiting. Generalized abdominal discomfort as well. No diarrhea. No back pain, no fevers, no dysuria. No vaginal bleeding or lof. No sick contacts. No difficulty breathing. No cough. No recent travel  Past Medical History:  Diagnosis Date  . Chronic bronchitis (HCC)     No past surgical history on file.  Family History  Problem Relation Age of Onset  . Heart disease Maternal Grandfather   . Heart disease Paternal Grandmother     Social History  Substance Use Topics  . Smoking status: Current Every Day Smoker    Packs/day: 0.50    Types: Cigarettes  . Smokeless tobacco: Current User  . Alcohol use Yes     Comment: occasionally    No Known Allergies  Prescriptions Prior to Admission  Medication Sig Dispense Refill Last Dose  . acetaminophen (TYLENOL) 325 MG tablet Take 650 mg by mouth every 6 (six) hours as needed for mild pain.   06/17/2016 at Unknown time  . albuterol (PROVENTIL HFA;VENTOLIN HFA) 108 (90 Base) MCG/ACT inhaler Inhale 1-2 puffs into the lungs every 6 (six) hours as needed for wheezing or shortness of breath. 1 Inhaler 0 Rescue  . etonogestrel (IMPLANON) 68 MG IMPL implant Inject 1 each into the skin continuous.   continuous    Review of Systems - Negative except for what is mentioned in HPI.  Physical Exam  Blood pressure 137/79, pulse 96, temperature 98.2 F (36.8 C), temperature source Oral, resp. rate 16, height 5\' 1"  (1.549 m), weight 225 lb (102.1 kg), last menstrual period 04/14/2016. GENERAL: Well-developed, well-nourished female in no acute distress.  LUNGS: Clear to auscultation bilaterally.  HEART: Regular  rate and rhythm. ABDOMEN: Soft, , nondistended, generalized mild ttp.  EXTREMITIES: Nontender, no edema, 2+ distal pulses. GU: deferred   Labs: Results for orders placed or performed during the hospital encounter of 06/18/16 (from the past 24 hour(s))  Urinalysis, Routine w reflex microscopic   Collection Time: 06/18/16  5:00 PM  Result Value Ref Range   Color, Urine YELLOW YELLOW   APPearance HAZY (A) CLEAR   Specific Gravity, Urine 1.028 1.005 - 1.030   pH 5.0 5.0 - 8.0   Glucose, UA NEGATIVE NEGATIVE mg/dL   Hgb urine dipstick NEGATIVE NEGATIVE   Bilirubin Urine NEGATIVE NEGATIVE   Ketones, ur 5 (A) NEGATIVE mg/dL   Protein, ur 30 (A) NEGATIVE mg/dL   Nitrite NEGATIVE NEGATIVE   Leukocytes, UA NEGATIVE NEGATIVE   RBC / HPF 0-5 0 - 5 RBC/hpf   WBC, UA 0-5 0 - 5 WBC/hpf   Bacteria, UA NONE SEEN NONE SEEN   Squamous Epithelial / LPF 6-30 (A) NONE SEEN   Mucous PRESENT   Pregnancy, urine POC   Collection Time: 06/18/16  5:05 PM  Result Value Ref Range   Preg Test, Ur POSITIVE (A) NEGATIVE  CBC with Differential   Collection Time: 06/18/16  6:21 PM  Result Value Ref Range   WBC 18.8 (H) 4.0 - 10.5 K/uL   RBC 4.37 3.87 - 5.11 MIL/uL   Hemoglobin 13.8 12.0 - 15.0 g/dL   HCT 16.139.8 09.636.0 - 04.546.0 %  MCV 91.1 78.0 - 100.0 fL   MCH 31.6 26.0 - 34.0 pg   MCHC 34.7 30.0 - 36.0 g/dL   RDW 82.9 56.2 - 13.0 %   Platelets 258 150 - 400 K/uL   Neutrophils Relative % 81 %   Neutro Abs 15.2 (H) 1.7 - 7.7 K/uL   Lymphocytes Relative 12 %   Lymphs Abs 2.2 0.7 - 4.0 K/uL   Monocytes Relative 6 %   Monocytes Absolute 1.1 (H) 0.1 - 1.0 K/uL   Eosinophils Relative 1 %   Eosinophils Absolute 0.2 0.0 - 0.7 K/uL   Basophils Relative 0 %   Basophils Absolute 0.0 0.0 - 0.1 K/uL  Comprehensive metabolic panel   Collection Time: 06/18/16  6:21 PM  Result Value Ref Range   Sodium 134 (L) 135 - 145 mmol/L   Potassium 4.6 3.5 - 5.1 mmol/L   Chloride 103 101 - 111 mmol/L   CO2 20 (L) 22 - 32  mmol/L   Glucose, Bld 101 (H) 65 - 99 mg/dL   BUN 10 6 - 20 mg/dL   Creatinine, Ser 8.65 0.44 - 1.00 mg/dL   Calcium 9.7 8.9 - 78.4 mg/dL   Total Protein 7.3 6.5 - 8.1 g/dL   Albumin 4.2 3.5 - 5.0 g/dL   AST 23 15 - 41 U/L   ALT 18 14 - 54 U/L   Alkaline Phosphatase 56 38 - 126 U/L   Total Bilirubin 1.3 (H) 0.3 - 1.2 mg/dL   GFR calc non Af Amer >60 >60 mL/min   GFR calc Af Amer >60 >60 mL/min   Anion gap 11 5 - 15  Lipase, blood   Collection Time: 06/18/16  6:21 PM  Result Value Ref Range   Lipase 22 11 - 51 U/L  TSH   Collection Time: 06/18/16  6:21 PM  Result Value Ref Range   TSH 0.499 0.350 - 4.500 uIU/mL    Imaging Studies:  No results found.   MDM - given elevated biliburin, elevated WBCs, will check RUQ u/s to eval for gallbladder pathology, though afebrile and without specific ruq pain, so do think more likely n/v of pregnancy. Sign-out to Dr. Doristine Counter 19:50> Lucien Budney CNM at [employer] [facility] 1/9/20187:32 PM   Addendum After IV#1 LR1000 w/ Phenergan 25mg  nausea slightly improved After IV#2 LR1000 w/ Zofran 8mg  > nausea resolved; sleepy  US Abdomen Limited Ruq  Result Date: 06/18/2016 CLINICAL DATA:  36 year old female with nausea and vomiting. Eight weeks pregnant. Abnormal LFTs. Initial encounter. EXAM: US ABDOMEN LIMITED - RIGHT UPPER QUADRANT COMPARISON:  CT Abdomen and Pelvis 11/13/2004. FINDINGS: Gallbladder: No gallstones or wall thickening visualized. No sonographic Murphy sign noted by sonographer. Common bile duct: Diameter: 4 mm, normal Liver: Liver echogenicity appears diffusely increased (image 18). No intrahepatic biliary ductal dilatation or discrete liver lesion. Other findings: Negative visible right kidney. No right upper quadrant free fluid. IMPRESSION: 1. Hepatic steatosis. 2. Otherwise negative right upper quadrant ultrasound. Normal gallbladder. Electronically Signed   By: Odessa Fleming M.D.   On: 06/18/2016 20:18  D/W Dr. Debroah Loop F/U with further  evaluation of hepatic steatosis at [facility] OB visit (LFTs normal)   ASSESSMENT/PLAN: [id number] at [redacted]w[redacted]d . 1. Nausea and vomiting during pregnancy prior to [redacted] weeks gestation   2. Nausea and vomiting   3. Dehydration, moderate   4. Hepatic steatosis during pregnancy (HCC)    Allergies as of 06/18/2016   No Known Allergies  Medication List    STOP taking these medications   acetaminophen 325 MG tablet Commonly known as:  TYLENOL   IMPLANON 68 MG Impl implant Generic drug:  etonogestrel     TAKE these medications   albuterol 108 (90 Base) MCG/ACT inhaler Commonly known as:  PROVENTIL HFA;VENTOLIN HFA Inhale 1-2 puffs into the lungs every 6 (six) hours as needed for wheezing or shortness of breath.   prenatal vitamin w/FE, FA 27-1 MG Tabs tablet Take 1 tablet by mouth daily.   promethazine 12.5 MG tablet Commonly known as:  PHENERGAN Take 1 tablet (12.5 mg total) by mouth every 6 (six) hours as needed for nausea or vomiting.      Follow-up Information    [facility]. Schedule an appointment as soon as possible for a visit in 1 week(s).   Contact information: [address] [place] [address] [phone]         Information given on pregnancy providers and MPW application

## 2016-06-18 NOTE — Progress Notes (Addendum)
1915: zofran started after completion of phenergan.  1945: pt states zofran helped with nausea and vomitting.   1955: pt to U/S.   2120: discharge instructions given with pt understanding. Pt taken to car via wheelchair.

## 2016-06-18 NOTE — Discharge Instructions (Signed)
Dehydration, Adult °Dehydration is when there is not enough fluid or water in your body. This happens when you lose more fluids than you take in. Dehydration can range from mild to very bad. It should be treated right away to keep it from getting very bad. °Symptoms of mild dehydration may include: °· Thirst. °· Dry lips. °· Slightly dry mouth. °· Dry, warm skin. °· Dizziness. °Symptoms of moderate dehydration may include: °· Very dry mouth. °· Muscle cramps. °· Dark pee (urine). Pee may be the color of tea. °· Your body making less pee. °· Your eyes making fewer tears. °· Heartbeat that is uneven or faster than normal (palpitations). °· Headache. °· Light-headedness, especially when you stand up from sitting. °· Fainting (syncope). °Symptoms of very bad dehydration may include: °· Changes in skin, such as: °¨ Cold and clammy skin. °¨ Blotchy (mottled) or pale skin. °¨ Skin that does not quickly return to normal after being lightly pinched and let go (poor skin turgor). °· Changes in body fluids, such as: °¨ Feeling very thirsty. °¨ Your eyes making fewer tears. °¨ Not sweating when body temperature is high, such as in hot weather. °¨ Your body making very little pee. °· Changes in vital signs, such as: °¨ Weak pulse. °¨ Pulse that is more than 100 beats a minute when you are sitting still. °¨ Fast breathing. °¨ Low blood pressure. °· Other changes, such as: °¨ Sunken eyes. °¨ Cold hands and feet. °¨ Confusion. °¨ Lack of energy (lethargy). °¨ Trouble waking up from sleep. °¨ Short-term weight loss. °¨ Unconsciousness. °Follow these instructions at home: °· If told by your doctor, drink an ORS: °¨ Make an ORS by using instructions on the package. °¨ Start by drinking small amounts, about ½ cup (120 mL) every 5-10 minutes. °¨ Slowly drink more until you have had the amount that your doctor said to have. °· Drink enough clear fluid to keep your pee clear or pale yellow. If you were told to drink an ORS, finish the ORS  first, then start slowly drinking clear fluids. Drink fluids such as: °¨ Water. Do not drink only water by itself. Doing that can make the salt (sodium) level in your body get too low (hyponatremia). °¨ Ice chips. °¨ Fruit juice that you have added water to (diluted). °¨ Low-calorie sports drinks. °· Avoid: °¨ Alcohol. °¨ Drinks that have a lot of sugar. These include high-calorie sports drinks, fruit juice that does not have water added, and soda. °¨ Caffeine. °¨ Foods that are greasy or have a lot of fat or sugar. °· Take over-the-counter and prescription medicines only as told by your doctor. °· Do not take salt tablets. Doing that can make the salt level in your body get too high (hypernatremia). °· Eat foods that have minerals (electrolytes). Examples include bananas, oranges, potatoes, tomatoes, and spinach. °· Keep all follow-up visits as told by your doctor. This is important. °Contact a doctor if: °· You have belly (abdominal) pain that: °¨ Gets worse. °¨ Stays in one area (localizes). °· You have a rash. °· You have a stiff neck. °· You get angry or annoyed more easily than normal (irritability). °· You are more sleepy than normal. °· You have a harder time waking up than normal. °· You feel: °¨ Weak. °¨ Dizzy. °¨ Very thirsty. °· You have peed (urinated) only a small amount of very dark pee during 6-8 hours. °Get help right away if: °· You have symptoms of   very bad dehydration. °· You cannot drink fluids without throwing up (vomiting). °· Your symptoms get worse with treatment. °· You have a fever. °· You have a very bad headache. °· You are throwing up or having watery poop (diarrhea) and it: °¨ Gets worse. °¨ Does not go away. °· You have blood or something green (bile) in your throw-up. °· You have blood in your poop (stool). This may cause poop to look black and tarry. °· You have not peed in 6-8 hours. °· You pass out (faint). °· Your heart rate when you are sitting still is more than 100 beats a  minute. °· You have trouble breathing. °This information is not intended to replace advice given to you by your health care provider. Make sure you discuss any questions you have with your health care provider. °Document Released: 03/23/2009 Document Revised: 12/15/2015 Document Reviewed: 07/21/2015 °Elsevier Interactive Patient Education © 2017 [employer] ° °Morning Sickness °Morning sickness is when you feel sick to your stomach (nauseous) during pregnancy. You may feel sick to your stomach and throw up (vomit). You may feel sick in the morning, but you can feel this way any time of day. Some women feel very sick to their stomach and cannot stop throwing up (hyperemesis gravidarum). °Follow these instructions at home: °· Only take medicines as told by your doctor. °· Take multivitamins as told by your doctor. Taking multivitamins before getting pregnant can stop or lessen the harshness of morning sickness. °· Eat dry toast or unsalted crackers before getting out of bed. °· Eat 5 to 6 small meals a day. °· Eat dry and bland foods like rice and baked potatoes. °· Do not drink liquids with meals. Drink between meals. °· Do not eat greasy, fatty, or spicy foods. °· Have someone cook for you if the smell of food causes you to feel sick or throw up. °· If you feel sick to your stomach after taking prenatal vitamins, take them at night or with a snack. °· Eat protein when you need a snack (nuts, yogurt, cheese). °· Eat unsweetened gelatins for dessert. °· Wear a bracelet used for sea sickness (acupressure wristband). °· Go to a doctor that puts thin needles into certain body points (acupuncture) to improve how you feel. °· Do not smoke. °· Use a humidifier to keep the air in your house free of odors. °· Get lots of fresh air. °Contact a doctor if: °· You need medicine to feel better. °· You feel dizzy or lightheaded. °· You are losing weight. °Get help right away if: °· You feel very sick to your stomach and cannot  stop throwing up. °· You pass out (faint). °This information is not intended to replace advice given to you by your health care provider. Make sure you discuss any questions you have with your health care provider. °Document Released: 07/04/2004 Document Revised: 11/02/2015 Document Reviewed: 11/11/2012 °Elsevier Interactive Patient Education © 2017 Elsevier Inc. ° °

## 2016-06-18 NOTE — MAU Note (Signed)
Pt presents to MAU with complaints of feeling aching for a couple of days. Started vomiting today. Denies any vaginal bleeding. Positive pregnancy at the beginning of the month

## 2016-06-19 ENCOUNTER — Emergency Department (HOSPITAL_COMMUNITY)
Admission: EM | Admit: 2016-06-19 | Discharge: 2016-06-19 | Disposition: A | Discharge status: Home / Self Care | Payer: Medicaid Other | Attending: Dermatology | Admitting: Dermatology

## 2016-06-19 ENCOUNTER — Encounter (HOSPITAL_COMMUNITY): Payer: Self-pay

## 2016-06-19 DIAGNOSIS — O219 Vomiting of pregnancy, unspecified: Secondary | ICD-10-CM | POA: Insufficient documentation

## 2016-06-19 DIAGNOSIS — Z5321 Procedure and treatment not carried out due to patient leaving prior to being seen by health care provider: Secondary | ICD-10-CM | POA: Insufficient documentation

## 2016-06-19 DIAGNOSIS — Z3A09 9 weeks gestation of pregnancy: Secondary | ICD-10-CM | POA: Insufficient documentation

## 2016-06-19 NOTE — ED Notes (Signed)
Pt no longer in the room.  

## 2016-06-19 NOTE — ED Notes (Signed)
Pt no longer in the triage room

## 2016-06-19 NOTE — ED Triage Notes (Signed)
Pt is [redacted] weeks pregnant and was seen yesterday at [facility] and treated for vomiting and dehydration, pt has continued to vomit since leaving MAU

## 2016-09-14 ENCOUNTER — Encounter (HOSPITAL_COMMUNITY): Payer: Self-pay | Admitting: *Deleted

## 2016-09-14 ENCOUNTER — Inpatient Hospital Stay (HOSPITAL_COMMUNITY)
Admission: AD | Admit: 2016-09-14 | Discharge: 2016-09-14 | Disposition: A | Discharge status: Home / Self Care | Payer: Medicaid Other | Source: Ambulatory Visit | Attending: Obstetrics and Gynecology | Admitting: Obstetrics and Gynecology

## 2016-09-14 DIAGNOSIS — R109 Unspecified abdominal pain: Secondary | ICD-10-CM | POA: Diagnosis present

## 2016-09-14 DIAGNOSIS — O99332 Smoking (tobacco) complicating pregnancy, second trimester: Secondary | ICD-10-CM | POA: Diagnosis not present

## 2016-09-14 DIAGNOSIS — O23592 Infection of other part of genital tract in pregnancy, second trimester: Secondary | ICD-10-CM | POA: Diagnosis not present

## 2016-09-14 DIAGNOSIS — B9689 Other specified bacterial agents as the cause of diseases classified elsewhere: Secondary | ICD-10-CM | POA: Diagnosis not present

## 2016-09-14 DIAGNOSIS — O26892 Other specified pregnancy related conditions, second trimester: Secondary | ICD-10-CM

## 2016-09-14 DIAGNOSIS — R1032 Left lower quadrant pain: Secondary | ICD-10-CM | POA: Insufficient documentation

## 2016-09-14 DIAGNOSIS — F17210 Nicotine dependence, cigarettes, uncomplicated: Secondary | ICD-10-CM | POA: Diagnosis not present

## 2016-09-14 DIAGNOSIS — M549 Dorsalgia, unspecified: Secondary | ICD-10-CM

## 2016-09-14 DIAGNOSIS — M545 Low back pain: Secondary | ICD-10-CM | POA: Insufficient documentation

## 2016-09-14 DIAGNOSIS — O0932 Supervision of pregnancy with insufficient antenatal care, second trimester: Secondary | ICD-10-CM

## 2016-09-14 DIAGNOSIS — R1031 Right lower quadrant pain: Secondary | ICD-10-CM | POA: Insufficient documentation

## 2016-09-14 DIAGNOSIS — Z3A21 21 weeks gestation of pregnancy: Secondary | ICD-10-CM | POA: Diagnosis not present

## 2016-09-14 DIAGNOSIS — N760 Acute vaginitis: Secondary | ICD-10-CM

## 2016-09-14 HISTORY — DX: Other specified bacterial agents as the cause of diseases classified elsewhere: N76.0

## 2016-09-14 HISTORY — DX: Acute vaginitis: N76.0

## 2016-09-14 HISTORY — DX: Dorsalgia, unspecified: M54.9

## 2016-09-14 HISTORY — DX: Unspecified abdominal pain: R10.9

## 2016-09-14 HISTORY — DX: Other specified pregnancy related conditions, second trimester: O26.892

## 2016-09-14 HISTORY — DX: Other specified bacterial agents as the cause of diseases classified elsewhere: B96.89

## 2016-09-14 LAB — COMPREHENSIVE METABOLIC PANEL
ALK PHOS: 41 U/L (ref 38–126)
ALT: 9 U/L — AB (ref 14–54)
AST: 12 U/L — ABNORMAL LOW (ref 15–41)
Albumin: 3.3 g/dL — ABNORMAL LOW (ref 3.5–5.0)
Anion gap: 6 (ref 5–15)
BILIRUBIN TOTAL: 0.5 mg/dL (ref 0.3–1.2)
BUN: 6 mg/dL (ref 6–20)
CALCIUM: 8.9 mg/dL (ref 8.9–10.3)
CHLORIDE: 105 mmol/L (ref 101–111)
CO2: 24 mmol/L (ref 22–32)
CREATININE: 0.55 mg/dL (ref 0.44–1.00)
Glucose, Bld: 103 mg/dL — ABNORMAL HIGH (ref 65–99)
Potassium: 3.5 mmol/L (ref 3.5–5.1)
Sodium: 135 mmol/L (ref 135–145)
TOTAL PROTEIN: 6.5 g/dL (ref 6.5–8.1)

## 2016-09-14 LAB — URINALYSIS, ROUTINE W REFLEX MICROSCOPIC
BILIRUBIN URINE: NEGATIVE
Bacteria, UA: NONE SEEN
Glucose, UA: NEGATIVE mg/dL
KETONES UR: NEGATIVE mg/dL
NITRITE: NEGATIVE
PH: 6 (ref 5.0–8.0)
Protein, ur: NEGATIVE mg/dL
SPECIFIC GRAVITY, URINE: 1.023 (ref 1.005–1.030)

## 2016-09-14 LAB — CBC WITH DIFFERENTIAL/PLATELET
Basophils Absolute: 0 10*3/uL (ref 0.0–0.1)
Basophils Relative: 0 %
EOS PCT: 2 %
Eosinophils Absolute: 0.1 10*3/uL (ref 0.0–0.7)
HEMATOCRIT: 33.9 % — AB (ref 36.0–46.0)
Hemoglobin: 11.3 g/dL — ABNORMAL LOW (ref 12.0–15.0)
LYMPHS ABS: 1.8 10*3/uL (ref 0.7–4.0)
LYMPHS PCT: 20 %
MCH: 30.5 pg (ref 26.0–34.0)
MCHC: 33.3 g/dL (ref 30.0–36.0)
MCV: 91.4 fL (ref 78.0–100.0)
MONO ABS: 0.4 10*3/uL (ref 0.1–1.0)
Monocytes Relative: 4 %
Neutro Abs: 6.6 10*3/uL (ref 1.7–7.7)
Neutrophils Relative %: 74 %
PLATELETS: 247 10*3/uL (ref 150–400)
RBC: 3.71 MIL/uL — AB (ref 3.87–5.11)
RDW: 13.6 % (ref 11.5–15.5)
WBC: 8.8 10*3/uL (ref 4.0–10.5)

## 2016-09-14 LAB — WET PREP, GENITAL
Sperm: NONE SEEN
Trich, Wet Prep: NONE SEEN
Yeast Wet Prep HPF POC: NONE SEEN

## 2016-09-14 MED ORDER — METRONIDAZOLE 500 MG PO TABS: 500.0000 mg | ORAL_TABLET | Freq: Two times a day (BID) | ORAL | 0 refills | Status: AC

## 2016-09-14 NOTE — Discharge Instructions (Signed)

## 2016-09-14 NOTE — MAU Note (Signed)
Patient presents with onset of abdominal cramping x 2 to 3 days worse last night, denies vaginal bleeding no discharge, has not had prenatal care, waiting for appointment with [facility].

## 2016-09-14 NOTE — MAU Provider Note (Signed)
History     CSN: [id number]  Arrival date and time: 09/14/16 0865   First Provider Initiated Contact with Patient 09/14/16 1049      Chief Complaint  Patient presents with  . Abdominal Cramping   Abdominal Pain  This is a new problem. The current episode started in the past 7 days. The onset quality is gradual. The problem occurs intermittently. The most recent episode lasted 3 days. The problem has been gradually worsening. The pain is located in the LLQ and RLQ. The pain is at a severity of 7/10. The pain is moderate. The quality of the pain is cramping and sharp. The abdominal pain radiates to the back (to lower back). Pertinent negatives include no constipation (last normal BM was 09/13/16), diarrhea, dysuria, fever, nausea or vomiting (did vomit yesterday, but assoc. with food eaten). The pain is aggravated by palpation. The pain is relieved by nothing. She has tried nothing for the symptoms.  Back Pain  This is a new problem. The current episode started in the past 7 days. The problem occurs constantly. The problem has been gradually worsening since onset. The pain is present in the gluteal. The quality of the pain is described as aching and cramping. The pain does not radiate. The pain is at a severity of 7/10. The pain is moderate. The pain is the same all the time. Stiffness is present all day. Associated symptoms include abdominal pain (cramping in lower abd; sometimes sharp and intense) and pelvic pain. Pertinent negatives include no bladder incontinence, bowel incontinence, dysuria, fever, leg pain or weakness. Risk factors include obesity, pregnancy and sedentary lifestyle. She has tried nothing for the symptoms.     Past Medical History:  Diagnosis Date  . Chronic bronchitis (HCC)     Past Surgical History:  Procedure Laterality Date  . NO PAST SURGERIES      Family History  Problem Relation Age of Onset  . Heart disease Maternal Grandfather   . Heart disease Paternal  Grandmother     Social History  Substance Use Topics  . Smoking status: Current Some Day Smoker    Packs/day: 0.25    Years: 7.00    Types: Cigarettes  . Smokeless tobacco: Current User     Comment: only smokes about 1 cigarette a day - causes sickness  . Alcohol use No     Comment: occasionally    Allergies: No Known Allergies  Prescriptions Prior to Admission  Medication Sig Dispense Refill Last Dose  . albuterol (PROVENTIL HFA;VENTOLIN HFA) 108 (90 Base) MCG/ACT inhaler Inhale 1-2 puffs into the lungs every 6 (six) hours as needed for wheezing or shortness of breath. 1 Inhaler 0 Rescue  . prenatal vitamin w/FE, FA (PRENATAL 1 + 1) 27-1 MG TABS tablet Take 1 tablet by mouth daily. 30 each 0   . promethazine (PHENERGAN) 12.5 MG tablet Take 1 tablet (12.5 mg total) by mouth every 6 (six) hours as needed for nausea or vomiting. 30 tablet 0     Review of Systems  Constitutional: Negative.  Negative for fever.  HENT: Negative.   Eyes: Negative.   Respiratory: Negative.   Cardiovascular: Negative.   Gastrointestinal: Positive for abdominal pain (cramping in lower abd; sometimes sharp and intense). Negative for bowel incontinence, constipation (last normal BM was 09/13/16), diarrhea, nausea and vomiting (did vomit yesterday, but assoc. with food eaten).  Endocrine: Negative.   Genitourinary: Positive for pelvic pain. Negative for bladder incontinence, dysuria, vaginal bleeding and vaginal discharge.       (+)  FM x 3 wks; getting stronger now  Musculoskeletal: Positive for back pain.  Allergic/Immunologic: Negative.   Neurological: Negative.  Negative for weakness.  Hematological: Negative.   Psychiatric/Behavioral: Negative.    Physical Exam   Blood pressure 132/77, pulse 94, temperature 98.2 F (36.8 C), resp. rate 18, height  (1.6 m), weight 220 lb (99.8 kg), last menstrual period 04/14/2016. / FHT by doppler: 156 bpm  Physical Exam  Constitutional: She is oriented to  person, place, and time. She appears well-developed and well-nourished.  HENT:  Head: Normocephalic.  Eyes: Pupils are equal, round, and reactive to light.  Neck: Normal range of motion.  Cardiovascular: Normal rate, regular rhythm and normal heart sounds.   Respiratory: Effort normal and breath sounds normal.  GI: Soft. Bowel sounds are normal. There is tenderness (with palpation in all 4 quadrants). There is no rebound and no guarding.  Genitourinary: Vagina normal and uterus normal.  Genitourinary Comments: Gravid, S=D, cervix: closed/long/firm/posterior, no CMT  Musculoskeletal: Normal range of motion.  Neurological: She is alert and oriented to person, place, and time. She has normal reflexes.  Skin: Skin is warm and dry.  Psychiatric: She has a normal mood and affect. Her behavior is normal. Judgment and thought content normal.    MAU Course  Procedures MDM GC/CT Wet Prep CBC w/diff CMP HIV Assessment and Plan  36 yo G3P2002 at 21.[redacted] wks gestation Late [facility] in 2nd trimester Abdominal Pain in Pregnancy Back Pain in Pregnancy Bacteria Vaginosis  Discharge Home Message sent to [facility] (Admin Pool - [place]) to schedule [facility] OB US MFM 14+ wks scheduled for 1 week Rx for Flagyl 500 mg po BID x 7 days sent to [employer] Educated to completely finish abx to rid BV / stay well-hydrated / call [facility], if no phone call by 09/20/16 Advised to use heating pad to lower back for pain relief / may use Tylenol 1000 mg every 8 hrs prn increased pain / Safe Medications in Pregnancy List given  Understanding verbalized, pt agrees with POC  Raelyn Mora, M MSN, CNM 09/14/2016, 10:50 AM

## 2016-09-15 LAB — HIV ANTIBODY (ROUTINE TESTING W REFLEX): HIV SCREEN 4TH GENERATION: NONREACTIVE

## 2016-09-15 LAB — URINE CULTURE: SPECIAL REQUESTS: NORMAL

## 2016-09-16 LAB — GC/CHLAMYDIA PROBE AMP (~~LOC~~) NOT AT ARMC
CHLAMYDIA, DNA PROBE: NEGATIVE
NEISSERIA GONORRHEA: NEGATIVE

## 2016-09-23 ENCOUNTER — Telehealth: Payer: Self-pay

## 2016-09-23 NOTE — Telephone Encounter (Signed)
Called to schedule appt/ wrong number in system.

## 2016-10-04 ENCOUNTER — Encounter: Payer: Medicaid Other | Admitting: Certified Nurse Midwife

## 2016-11-25 ENCOUNTER — Encounter: Payer: Self-pay | Admitting: Obstetrics

## 2016-11-25 ENCOUNTER — Ambulatory Visit (INDEPENDENT_AMBULATORY_CARE_PROVIDER_SITE_OTHER): Payer: Medicaid Other | Admitting: Obstetrics

## 2016-11-25 ENCOUNTER — Other Ambulatory Visit (HOSPITAL_COMMUNITY)
Admission: RE | Admit: 2016-11-25 | Discharge: 2016-11-25 | Disposition: A | Discharge status: Home / Self Care | Payer: Medicaid Other | Source: Ambulatory Visit | Attending: Certified Nurse Midwife | Admitting: Certified Nurse Midwife

## 2016-11-25 VITALS — BP 116/83 | HR 99 | Wt 217.0 lb

## 2016-11-25 DIAGNOSIS — K5900 Constipation, unspecified: Secondary | ICD-10-CM

## 2016-11-25 DIAGNOSIS — Z23 Encounter for immunization: Secondary | ICD-10-CM | POA: Diagnosis not present

## 2016-11-25 DIAGNOSIS — Z3689 Encounter for other specified antenatal screening: Secondary | ICD-10-CM

## 2016-11-25 DIAGNOSIS — Z3480 Encounter for supervision of other normal pregnancy, unspecified trimester: Secondary | ICD-10-CM | POA: Insufficient documentation

## 2016-11-25 DIAGNOSIS — O0993 Supervision of high risk pregnancy, unspecified, third trimester: Secondary | ICD-10-CM | POA: Diagnosis not present

## 2016-11-25 DIAGNOSIS — O2240 Hemorrhoids in pregnancy, unspecified trimester: Secondary | ICD-10-CM

## 2016-11-25 DIAGNOSIS — O0933 Supervision of pregnancy with insufficient antenatal care, third trimester: Secondary | ICD-10-CM | POA: Insufficient documentation

## 2016-11-25 DIAGNOSIS — E6609 Other obesity due to excess calories: Secondary | ICD-10-CM

## 2016-11-25 DIAGNOSIS — Z3481 Encounter for supervision of other normal pregnancy, first trimester: Secondary | ICD-10-CM | POA: Diagnosis not present

## 2016-11-25 DIAGNOSIS — O99613 Diseases of the digestive system complicating pregnancy, third trimester: Secondary | ICD-10-CM

## 2016-11-25 DIAGNOSIS — F17210 Nicotine dependence, cigarettes, uncomplicated: Secondary | ICD-10-CM

## 2016-11-25 DIAGNOSIS — O2243 Hemorrhoids in pregnancy, third trimester: Secondary | ICD-10-CM

## 2016-11-25 MED ORDER — HYDROCORTISONE ACETATE 25 MG RE SUPP: 25.0000 mg | Freq: Two times a day (BID) | RECTAL | 99 refills | Status: DC

## 2016-11-25 MED ORDER — CITRANATAL HARMONY 27-1-260 MG PO CAPS: 1.0000 | ORAL_CAPSULE | Freq: Every day | ORAL | 4 refills | Status: DC

## 2016-11-25 MED ORDER — DOCUSATE SODIUM 100 MG PO CAPS: 100.0000 mg | ORAL_CAPSULE | Freq: Two times a day (BID) | ORAL | 11 refills | Status: DC

## 2016-11-25 NOTE — Progress Notes (Signed)
Subjective:    Meghan Pruitt is being seen today for her first obstetrical visit.  This is not a planned pregnancy. She is at [redacted]w[redacted]d gestation. Her obstetrical history is significant for obesity, smoker and late to prenatal care. Relationship with FOB: significant other, not living together. Patient does intend to breast feed. Pregnancy history fully reviewed.  The information documented in the HPI was reviewed and verified.  Menstrual History: OB History    Gravida Para Term Preterm AB Living   3 2 2     2    SAB TAB Ectopic Multiple Live Births                  Patient's last menstrual period was 04/14/2016.    Past Medical History:  Diagnosis Date  . Abdominal pain in pregnancy, second trimester 09/14/2016  . Back pain during pregnancy in second trimester 09/14/2016  . Bacterial vaginosis 09/14/2016  . Chronic bronchitis (HCC)     Past Surgical History:  Procedure Laterality Date  . NO PAST SURGERIES       (Not in a hospital admission) No Known Allergies  Social History  Substance Use Topics  . Smoking status: Current Some Day Smoker    Packs/day: 0.25    Years: 7.00    Types: Cigarettes  . Smokeless tobacco: Current User     Comment: only smokes about 1 cigarette a day - causes sickness  . Alcohol use No     Comment: occasionally    Family History  Problem Relation Age of Onset  . Heart disease Maternal Grandfather   . Heart disease Paternal Grandmother      Review of Systems Constitutional: negative for weight loss Gastrointestinal: negative for vomiting Genitourinary:negative for genital lesions and vaginal discharge and dysuria Musculoskeletal:negative for back pain Behavioral/Psych: negative for abusive relationship, depression, illegal drug usage and tobacco use    Objective:    BP 116/83   Pulse 99   Wt 217 lb (98.4 kg)   LMP 04/14/2016   BMI 38.44 kg/m  General Appearance:    Alert, cooperative, no distress, appears stated age  Head:     Normocephalic, without obvious abnormality, atraumatic  Eyes:    PERRL, conjunctiva/corneas clear, EOM's intact, fundi    benign, both eyes  Ears:    Normal TM's and external ear canals, both ears  Nose:   Nares normal, septum midline, mucosa normal, no drainage    or sinus tenderness  Throat:   Lips, mucosa, and tongue normal; teeth and gums normal  Neck:   Supple, symmetrical, trachea midline, no adenopathy;    thyroid:  no enlargement/tenderness/nodules; no carotid   bruit or JVD  Back:     Symmetric, no curvature, ROM normal, no CVA tenderness  Lungs:     Clear to auscultation bilaterally, respirations unlabored  Chest Wall:    No tenderness or deformity   Heart:    Regular rate and rhythm, S1 and S2 normal, no murmur, rub   or gallop  Breast Exam:    No tenderness, masses, or nipple abnormality  Abdomen:     Soft, non-tender, bowel sounds active all four quadrants,    no masses, no organomegaly  Genitalia:    Normal female without lesion, discharge or tenderness  Extremities:   Extremities normal, atraumatic, no cyanosis or edema  Pulses:   2+ and symmetric all extremities  Skin:   Skin color, texture, turgor normal, no rashes or lesions  Lymph nodes:   Cervical,  supraclavicular, and axillary nodes normal  Neurologic:   CNII-XII intact, normal strength, sensation and reflexes    throughout      Lab Review Urine pregnancy test Labs reviewed yes Radiologic studies reviewed no  Assessment:    Pregnancy at 6138w1d weeks    Plan:     1. Encounter for supervision of high risk pregnancy in third trimester, antepartum Rx: - Hemoglobinopathy evaluation - Obstetric Panel, Including HIV - Culture, OB Urine - VITAMIN D 25 Hydroxy (Vit-D Deficiency, Fractures) - Cervicovaginal ancillary only - Varicella zoster antibody, IgG - Cytology - PAP - US MFM OB COMP + 14 WK; Future - Prenat-FeFmCb-DSS-FA-DHA w/o A (CITRANATAL HARMONY) 27-1-260 MG CAPS; Take 1 capsule by mouth daily before  breakfast.  Dispense: 90 capsule; Refill: 4  2. Late prenatal care affecting pregnancy in third trimester Rx: - US MFM OB COMP + 14 WK; Future  3. Obesity due to excess calories without serious comorbidity, unspecified classification   4. Tobacco dependence due to cigarettes   5. Encounter for fetal anatomic survey Rx: - US MFM OB COMP + 14 WK; Future  6. Constipation during pregnancy in third trimester Rx: - docusate sodium (COLACE) 100 MG capsule; Take 1 capsule (100 mg total) by mouth 2 (two) times daily.  Dispense: 60 capsule; Refill: 11  7. Hemorrhoids during pregnancy, antepartum Rx: - hydrocortisone (ANUSOL-HC) 25 MG suppository; Place 1 suppository (25 mg total) rectally 2 (two) times daily.  Dispense: 24 suppository; Refill: prn    Prenatal vitamins.  Counseling provided regarding continued use of seat belts, cessation of alcohol consumption, smoking or use of illicit drugs; infection precautions i.e., influenza/TDAP immunizations, toxoplasmosis,CMV, parvovirus, listeria and varicella; workplace safety, exercise during pregnancy; routine dental care, safe medications, sexual activity, hot tubs, saunas, pools, travel, caffeine use, fish and methlymercury, potential toxins, hair treatments, varicose veins Weight gain recommendations per IOM guidelines reviewed: underweight/BMI< 18.5--> gain 28 - 40 lbs; normal weight/BMI 18.5 - 24.9--> gain 25 - 35 lbs; overweight/BMI 25 - 29.9--> gain 15 - 25 lbs; obese/BMI >30->gain  11 - 20 lbs Problem list reviewed and updated. FIRST/CF mutation testing/NIPT/QUAD SCREEN/fragile X/Ashkenazi Jewish population testing/Spinal muscular atrophy discussed: n/a. Role of ultrasound in pregnancy discussed; fetal survey: requested. Amniocentesis discussed: n/a.  No orders of the defined types were placed in this encounter.  Orders Placed This Encounter  Procedures  . Culture, OB Urine  . Hemoglobinopathy evaluation  . Obstetric Panel, Including  HIV  . VITAMIN D 25 Hydroxy (Vit-D Deficiency, Fractures)  . Varicella zoster antibody, IgG    Follow up in 2 weeks. 50% of 20 min visit spent on counseling and coordination of care. Patient ID: Georgann Housekeeperndear C Kercheval, female   DOB: 09/30/1980, 36 y.o.   MRN: [id number]

## 2016-11-26 ENCOUNTER — Other Ambulatory Visit: Payer: Self-pay | Admitting: Obstetrics

## 2016-11-26 DIAGNOSIS — E559 Vitamin D deficiency, unspecified: Secondary | ICD-10-CM

## 2016-11-26 LAB — HEMOGLOBIN A1C
ESTIMATED AVERAGE GLUCOSE: 111 mg/dL
Hgb A1c MFr Bld: 5.5 % (ref 4.8–5.6)

## 2016-11-26 LAB — VITAMIN D 25 HYDROXY (VIT D DEFICIENCY, FRACTURES): Vit D, 25-Hydroxy: 13.5 ng/mL — ABNORMAL LOW (ref 30.0–100.0)

## 2016-11-26 MED ORDER — VITAMIN D 50 MCG (2000 UT) PO TABS
2000.0000 [IU] | ORAL_TABLET | Freq: Every day | ORAL | 5 refills | Status: DC
Start: 2016-11-26 — End: 2017-03-13

## 2016-11-26 NOTE — Addendum Note (Signed)
Addended by: Dalphine HandingGARDNER, Milla Wahlberg L on: 11/26/2016 08:51 AM   Modules accepted: Orders

## 2016-11-27 LAB — OBSTETRIC PANEL, INCLUDING HIV
Antibody Screen: NEGATIVE
BASOS ABS: 0 10*3/uL (ref 0.0–0.2)
Basos: 0 %
EOS (ABSOLUTE): 0.2 10*3/uL (ref 0.0–0.4)
EOS: 2 %
HEMOGLOBIN: 11.4 g/dL (ref 11.1–15.9)
HEP B S AG: NEGATIVE
HIV SCREEN 4TH GENERATION: NONREACTIVE
Hematocrit: 34.3 % (ref 34.0–46.6)
IMMATURE GRANULOCYTES: 0 %
Immature Grans (Abs): 0 10*3/uL (ref 0.0–0.1)
Lymphocytes Absolute: 2.2 10*3/uL (ref 0.7–3.1)
Lymphs: 22 %
MCH: 29.8 pg (ref 26.6–33.0)
MCHC: 33.2 g/dL (ref 31.5–35.7)
MCV: 90 fL (ref 79–97)
MONOCYTES: 6 %
Monocytes Absolute: 0.6 10*3/uL (ref 0.1–0.9)
NEUTROS ABS: 7.1 10*3/uL — AB (ref 1.4–7.0)
Neutrophils: 70 %
PLATELETS: 223 10*3/uL (ref 150–379)
RBC: 3.82 x10E6/uL (ref 3.77–5.28)
RDW: 13.9 % (ref 12.3–15.4)
RH TYPE: POSITIVE
RPR: NONREACTIVE
RUBELLA: 3.85 {index} (ref >0.99)
WBC: 10.1 10*3/uL (ref 3.4–10.8)

## 2016-11-27 LAB — CERVICOVAGINAL ANCILLARY ONLY
Bacterial vaginitis: POSITIVE — AB
CANDIDA VAGINITIS: NEGATIVE
CHLAMYDIA, DNA PROBE: NEGATIVE
NEISSERIA GONORRHEA: NEGATIVE
TRICH (WINDOWPATH): NEGATIVE

## 2016-11-27 LAB — HEMOGLOBINOPATHY EVALUATION
HEMOGLOBIN F QUANTITATION: 0 % (ref 0.0–2.0)
HGB C: 0 %
HGB S: 0 %
HGB VARIANT: 0 %
Hemoglobin A2 Quantitation: 2.5 % (ref 1.8–3.2)
Hgb A: 97.5 % (ref 96.4–98.8)

## 2016-11-27 LAB — VARICELLA ZOSTER ANTIBODY, IGG: Varicella zoster IgG: 422 index (ref >165)

## 2016-11-27 NOTE — Addendum Note (Signed)
Addended by: Brock BadHARPER, Dail Lerew A on: 11/27/2016 09:42 AM   Modules accepted: Orders

## 2016-11-28 ENCOUNTER — Telehealth: Payer: Self-pay

## 2016-11-28 ENCOUNTER — Other Ambulatory Visit: Payer: Self-pay | Admitting: Obstetrics

## 2016-11-28 DIAGNOSIS — N760 Acute vaginitis: Principal | ICD-10-CM

## 2016-11-28 DIAGNOSIS — B9689 Other specified bacterial agents as the cause of diseases classified elsewhere: Secondary | ICD-10-CM

## 2016-11-28 LAB — CYTOLOGY - PAP
DIAGNOSIS: NEGATIVE
HPV: NOT DETECTED

## 2016-11-28 LAB — URINE CULTURE, OB REFLEX

## 2016-11-28 LAB — CULTURE, OB URINE

## 2016-11-28 MED ORDER — METRONIDAZOLE 500 MG PO TABS: 500.0000 mg | ORAL_TABLET | Freq: Two times a day (BID) | ORAL | 2 refills | Status: DC

## 2016-11-28 NOTE — Telephone Encounter (Signed)
Attempted to contact, woman answered and stated wrong #.

## 2016-12-06 ENCOUNTER — Ambulatory Visit (HOSPITAL_COMMUNITY)
Admission: RE | Admit: 2016-12-06 | Discharge: 2016-12-06 | Disposition: A | Discharge status: Home / Self Care | Payer: Medicaid Other | Source: Ambulatory Visit | Attending: Obstetrics | Admitting: Obstetrics

## 2016-12-06 ENCOUNTER — Other Ambulatory Visit: Payer: Self-pay | Admitting: Obstetrics

## 2016-12-06 DIAGNOSIS — O0993 Supervision of high risk pregnancy, unspecified, third trimester: Secondary | ICD-10-CM

## 2016-12-06 DIAGNOSIS — O0933 Supervision of pregnancy with insufficient antenatal care, third trimester: Secondary | ICD-10-CM

## 2016-12-06 DIAGNOSIS — E6609 Other obesity due to excess calories: Secondary | ICD-10-CM

## 2016-12-06 DIAGNOSIS — Z3A32 32 weeks gestation of pregnancy: Secondary | ICD-10-CM

## 2016-12-06 DIAGNOSIS — O99213 Obesity complicating pregnancy, third trimester: Secondary | ICD-10-CM | POA: Insufficient documentation

## 2016-12-06 DIAGNOSIS — Z3689 Encounter for other specified antenatal screening: Secondary | ICD-10-CM

## 2016-12-06 DIAGNOSIS — Z3A33 33 weeks gestation of pregnancy: Secondary | ICD-10-CM | POA: Insufficient documentation

## 2016-12-10 ENCOUNTER — Encounter: Payer: Self-pay | Admitting: Obstetrics

## 2016-12-10 ENCOUNTER — Ambulatory Visit (INDEPENDENT_AMBULATORY_CARE_PROVIDER_SITE_OTHER): Payer: Medicaid Other | Admitting: Obstetrics

## 2016-12-10 DIAGNOSIS — O0993 Supervision of high risk pregnancy, unspecified, third trimester: Secondary | ICD-10-CM

## 2016-12-10 NOTE — Progress Notes (Signed)
Patient reports good fetal movement and complains of a lot of pressure, denies bleeding.

## 2016-12-10 NOTE — Progress Notes (Signed)
Subjective:  Georgann Housekeeperndear C Michaelsen is a 36 y.o. G3P2002 at 4660w2d being seen today for ongoing prenatal care.  She is currently monitored for the following issues for this high-risk pregnancy and has Abdominal pain in pregnancy, second trimester; Back pain during pregnancy in second trimester; Bacterial vaginosis; Encounter for supervision of high risk pregnancy in third trimester, antepartum; and Late prenatal care affecting pregnancy in third trimester on her problem list.  Patient reports backache and pressure.  Contractions: Irregular. Vag. Bleeding: None.  Movement: Present. Denies leaking of fluid.   The following portions of the patient's history were reviewed and updated as appropriate: allergies, current medications, past family history, past medical history, past social history, past surgical history and problem list. Problem list updated.  Objective:   Vitals:   12/10/16 1108  BP: 114/79  Pulse: (!) 114  Weight: 220 lb 4.8 oz (99.9 kg)    Fetal Status: Fetal Heart Rate (bpm): 150   Movement: Present     General:  Alert, oriented and cooperative. Patient is in no acute distress.  Skin: Skin is warm and dry. No rash noted.   Cardiovascular: Normal heart rate noted  Respiratory: Normal respiratory effort, no problems with respiration noted  Abdomen: Soft, gravid, appropriate for gestational age. Pain/Pressure: Present     Pelvic:  Cervical exam deferred        Extremities: Normal range of motion.  Edema: None  Mental Status: Normal mood and affect. Normal behavior. Normal judgment and thought content.   Urinalysis:      Assessment and Plan:  Pregnancy: G3P2002 at 4760w2d  1. Encounter for supervision of high risk pregnancy in third trimester, antepartum   Preterm labor symptoms and general obstetric precautions including but not limited to vaginal bleeding, contractions, leaking of fluid and fetal movement were reviewed in detail with the patient. Please refer to After Visit  Summary for other counseling recommendations.  Return in about 1 week (around 12/17/2016) for ROB.   Brock BadHarper, Charles A, MD

## 2016-12-13 ENCOUNTER — Inpatient Hospital Stay (HOSPITAL_COMMUNITY)
Admission: AD | Admit: 2016-12-13 | Discharge: 2016-12-13 | Disposition: A | Discharge status: Home / Self Care | Payer: Medicaid Other | Source: Ambulatory Visit | Attending: Obstetrics and Gynecology | Admitting: Obstetrics and Gynecology

## 2016-12-13 ENCOUNTER — Encounter (HOSPITAL_COMMUNITY): Payer: Self-pay

## 2016-12-13 DIAGNOSIS — O91119 Abscess of breast associated with pregnancy, unspecified trimester: Secondary | ICD-10-CM

## 2016-12-13 DIAGNOSIS — F17210 Nicotine dependence, cigarettes, uncomplicated: Secondary | ICD-10-CM | POA: Diagnosis not present

## 2016-12-13 DIAGNOSIS — N644 Mastodynia: Secondary | ICD-10-CM | POA: Diagnosis present

## 2016-12-13 DIAGNOSIS — B9689 Other specified bacterial agents as the cause of diseases classified elsewhere: Secondary | ICD-10-CM | POA: Diagnosis not present

## 2016-12-13 DIAGNOSIS — R109 Unspecified abdominal pain: Secondary | ICD-10-CM

## 2016-12-13 DIAGNOSIS — O99333 Smoking (tobacco) complicating pregnancy, third trimester: Secondary | ICD-10-CM | POA: Insufficient documentation

## 2016-12-13 DIAGNOSIS — O26893 Other specified pregnancy related conditions, third trimester: Secondary | ICD-10-CM

## 2016-12-13 DIAGNOSIS — N760 Acute vaginitis: Secondary | ICD-10-CM

## 2016-12-13 DIAGNOSIS — O91113 Abscess of breast associated with pregnancy, third trimester: Secondary | ICD-10-CM | POA: Insufficient documentation

## 2016-12-13 DIAGNOSIS — Z3A34 34 weeks gestation of pregnancy: Secondary | ICD-10-CM | POA: Insufficient documentation

## 2016-12-13 DIAGNOSIS — O23593 Infection of other part of genital tract in pregnancy, third trimester: Secondary | ICD-10-CM | POA: Insufficient documentation

## 2016-12-13 LAB — CBC WITH DIFFERENTIAL/PLATELET
BASOS ABS: 0 10*3/uL (ref 0.0–0.1)
BASOS PCT: 0 %
EOS ABS: 0.1 10*3/uL (ref 0.0–0.7)
Eosinophils Relative: 1 %
HCT: 32.6 % — ABNORMAL LOW (ref 36.0–46.0)
Hemoglobin: 11.1 g/dL — ABNORMAL LOW (ref 12.0–15.0)
Lymphocytes Relative: 16 %
Lymphs Abs: 1.8 10*3/uL (ref 0.7–4.0)
MCH: 31.3 pg (ref 26.0–34.0)
MCHC: 34 g/dL (ref 30.0–36.0)
MCV: 91.8 fL (ref 78.0–100.0)
Monocytes Absolute: 0.7 10*3/uL (ref 0.1–1.0)
Monocytes Relative: 6 %
NEUTROS ABS: 8.6 10*3/uL — AB (ref 1.7–7.7)
Neutrophils Relative %: 77 %
Platelets: 205 10*3/uL (ref 150–400)
RBC: 3.55 MIL/uL — AB (ref 3.87–5.11)
RDW: 14 % (ref 11.5–15.5)
WBC: 11.1 10*3/uL — AB (ref 4.0–10.5)

## 2016-12-13 LAB — URINALYSIS, ROUTINE W REFLEX MICROSCOPIC
BILIRUBIN URINE: NEGATIVE
Glucose, UA: NEGATIVE mg/dL
Hgb urine dipstick: NEGATIVE
Ketones, ur: 20 mg/dL — AB
LEUKOCYTES UA: NEGATIVE
Nitrite: NEGATIVE
Protein, ur: 30 mg/dL — AB
SPECIFIC GRAVITY, URINE: 1.026 (ref 1.005–1.030)
pH: 5 (ref 5.0–8.0)

## 2016-12-13 LAB — WET PREP, GENITAL
SPERM: NONE SEEN
Trich, Wet Prep: NONE SEEN
Yeast Wet Prep HPF POC: NONE SEEN

## 2016-12-13 MED ORDER — CEFTRIAXONE SODIUM 1 G IJ SOLR
1.0000 g | Freq: Once | INTRAMUSCULAR | Status: AC
  Administered 2016-12-13: 1 g via INTRAMUSCULAR
  Filled 2016-12-13: qty 10

## 2016-12-13 MED ORDER — OXYCODONE-ACETAMINOPHEN 5-325 MG PO TABS
2.0000 | ORAL_TABLET | Freq: Once | ORAL | Status: AC
  Administered 2016-12-13: 2 via ORAL
  Filled 2016-12-13: qty 2

## 2016-12-13 MED ORDER — OXYCODONE-ACETAMINOPHEN 5-325 MG PO TABS: 2.0000 | ORAL_TABLET | Freq: Once | ORAL | 0 refills | Status: AC

## 2016-12-13 MED ORDER — CEPHALEXIN 500 MG PO CAPS: 500.0000 mg | ORAL_CAPSULE | Freq: Four times a day (QID) | ORAL | 0 refills | Status: AC

## 2016-12-13 NOTE — MAU Provider Note (Signed)
Chief Complaint:  Back Pain and Breast Pain   First Provider Initiated Contact with Patient 12/13/16 1658      HPI: Meghan Pruitt is a 36 y.o. G3P2002 at [redacted]w[redacted]d who presents to maternity admissions reporting abdominal and back pain and pelvic pressure starting today and pain and a mass in her right breast starting 2-3 days ago and worsening today. She reports the abdominal and back pain are intermittent, and associated with a constant pelvic pressure and vaginal irritation/swelling.  She has not noticed any change in vaginal discharge or itching/burning.  She has tried resting, drinking more water, and position changes that have not helped her pain. Her breast pain is worsening and the mass that is in the top of her breast above her nipple but extends into the areola is getting larger and more painful. She has tried tylenol and warm compresses but the pain/mass are worsening.  It is associated with chills but no known fever.  She is not leaking any colostrum/milk. She does have an inverted nipple on the right side that started 2 years ago but [facility] and [facility] were normal.   She reports good fetal movement, denies LOF, vaginal bleeding, vaginal itching/burning, urinary symptoms, h/a, dizziness, n/v, or fever.  HPI  Past Medical History: Past Medical History:  Diagnosis Date  . Abdominal pain in pregnancy, second trimester 09/14/2016  . Back pain during pregnancy in second trimester 09/14/2016  . Bacterial vaginosis 09/14/2016  . Chronic bronchitis (HCC)     Past obstetric history: OB History  Gravida Para Term Preterm AB Living  3 2 2     2   SAB TAB Ectopic Multiple Live Births               # Outcome Date GA Lbr Len/2nd Weight Sex Delivery Anes PTL Lv  3 Current           2 Term      Vag-Spont     1 Term      Vag-Spont         Past Surgical History: Past Surgical History:  Procedure Laterality Date  . NO PAST SURGERIES      Family  History: Family History  Problem Relation Age of Onset  . Heart disease Maternal Grandfather   . Heart disease Paternal Grandmother     Social History: Social History  Substance Use Topics  . Smoking status: Current Some Day Smoker    Packs/day: 0.25    Years: 7.00    Types: Cigarettes  . Smokeless tobacco: Current User     Comment: only smokes about 1 cigarette a day - causes sickness  . Alcohol use No     Comment: occasionally    Allergies: No Known Allergies  Meds:  Prescriptions Prior to Admission  Medication Sig Dispense Refill Last Dose  . acetaminophen (TYLENOL) 500 MG tablet Take 1,000 mg by mouth every 6 (six) hours as needed for mild pain, moderate pain, fever or headache.   12/13/2016 at Unknown time  . Cholecalciferol (VITAMIN D) 2000 units tablet Take 1 tablet (2,000 Units total) by mouth daily. 30 tablet 5 12/12/2016 at Unknown time  . docusate sodium (COLACE) 100 MG capsule Take 1 capsule (100 mg total) by mouth 2 (two) times daily. 60 capsule 11 12/12/2016 at Unknown time  . Prenat-FeFmCb-DSS-FA-DHA w/o A (CITRANATAL HARMONY) 27-1-260 MG CAPS Take 1 capsule by mouth daily before breakfast. 90 capsule 4 12/13/2016 at  Unknown time  . albuterol (PROVENTIL HFA;VENTOLIN HFA) 108 (90 Base) MCG/ACT inhaler Inhale 1-2 puffs into the lungs every 6 (six) hours as needed for wheezing or shortness of breath. 1 Inhaler 0 Not Taking  . hydrocortisone (ANUSOL-HC) 25 MG suppository Place 1 suppository (25 mg total) rectally 2 (two) times daily. (Patient not taking: Reported on 12/13/2016) 24 suppository prn Not Taking at Unknown time  . metroNIDAZOLE (FLAGYL) 500 MG tablet Take 1 tablet (500 mg total) by mouth 2 (two) times daily. (Patient not taking: Reported on 12/13/2016) 14 tablet 2 Not Taking at Unknown time    ROS:  Review of Systems  Constitutional: Negative for chills, fatigue and fever.  Eyes: Negative for visual disturbance.  Respiratory: Negative for shortness of breath.    Cardiovascular: Negative for chest pain.  Gastrointestinal: Positive for abdominal pain. Negative for nausea and vomiting.  Genitourinary: Positive for pelvic pain and vaginal pain. Negative for difficulty urinating, dysuria, flank pain, vaginal bleeding and vaginal discharge.  Musculoskeletal: Positive for back pain.  Skin:       Breast mass/redness of right breast above nipple  Neurological: Negative for dizziness and headaches.  Psychiatric/Behavioral: Negative.      I have reviewed patient's Past Medical Hx, Surgical Hx, Family Hx, Social Hx, medications and allergies.   Physical Exam   Patient Vitals for the past 24 hrs:  BP Temp Temp src Pulse Resp SpO2 Height Weight  12/13/16 1723 - - - (!) 109 - 97 % - -  12/13/16 1705 - 98.6 F (37 C) Oral - - - - -  12/13/16 1505 119/76 - Oral (!) 116 20 100 % 5' 2.6" (1.59 m) 220 lb 12.8 oz (100.2 kg)   Constitutional: Well-developed, well-nourished female in no acute distress.  Cardiovascular: normal rate Respiratory: normal effort Breast: left breast wnl, right breast with hard smooth round mass approximately 4 cm in diameter palpable at 11 o'clock above and extending down toward areola/nipple, this is significantly painful to palpation GI: Abd soft, non-tender, gravid appropriate for gestational age.  MS: Extremities nontender, no edema, normal ROM Neurologic: Alert and oriented x 4.  GU: Neg CVAT.  PELVIC EXAM: Cervix pink, visually closed, without lesion, moderate amount frothy white discharge, vaginal walls and external genitalia normal  Dilation: Closed Effacement (%): Thick Cervical Position: Posterior Exam by:: L Leftwich-Kirby CNM  FHT:  Baseline 145 , moderate variability, accelerations present, no decelerations Contractions: None on toco or to palpation   Labs: Results for orders placed or performed during the hospital encounter of 12/13/16 (from the past 24 hour(s))  Urinalysis, Routine w reflex microscopic      Status: Abnormal   Collection Time: 12/13/16  3:08 PM  Result Value Ref Range   Color, Urine AMBER (A) YELLOW   APPearance CLOUDY (A) CLEAR   Specific Gravity, Urine 1.026 1.005 - 1.030   pH 5.0 5.0 - 8.0   Glucose, UA NEGATIVE NEGATIVE mg/dL   Hgb urine dipstick NEGATIVE NEGATIVE   Bilirubin Urine NEGATIVE NEGATIVE   Ketones, ur 20 (A) NEGATIVE mg/dL   Protein, ur 30 (A) NEGATIVE mg/dL   Nitrite NEGATIVE NEGATIVE   Leukocytes, UA NEGATIVE NEGATIVE   RBC / HPF 0-5 0 - 5 RBC/hpf   WBC, UA 6-30 0 - 5 WBC/hpf   Bacteria, UA RARE (A) NONE SEEN   Squamous Epithelial / LPF TOO NUMEROUS TO COUNT (A) NONE SEEN   Mucous PRESENT   Wet prep, genital     Status: Abnormal  Collection Time: 12/13/16  5:08 PM  Result Value Ref Range   Yeast Wet Prep HPF POC NONE SEEN NONE SEEN   Trich, Wet Prep NONE SEEN NONE SEEN   Clue Cells Wet Prep HPF POC PRESENT (A) NONE SEEN   WBC, Wet Prep HPF POC MODERATE (A) NONE SEEN   Sperm NONE SEEN   CBC with Differential/Platelet     Status: Abnormal   Collection Time: 12/13/16  5:35 PM  Result Value Ref Range   WBC 11.1 (H) 4.0 - 10.5 K/uL   RBC 3.55 (L) 3.87 - 5.11 MIL/uL   Hemoglobin 11.1 (L) 12.0 - 15.0 g/dL   HCT 40.9 (L) 81.1 - 91.4 %   MCV 91.8 78.0 - 100.0 fL   MCH 31.3 26.0 - 34.0 pg   MCHC 34.0 30.0 - 36.0 g/dL   RDW 78.2 95.6 - 21.3 %   Platelets 205 150 - 400 K/uL   Neutrophils Relative % 77 %   Neutro Abs 8.6 (H) 1.7 - 7.7 K/uL   Lymphocytes Relative 16 %   Lymphs Abs 1.8 0.7 - 4.0 K/uL   Monocytes Relative 6 %   Monocytes Absolute 0.7 0.1 - 1.0 K/uL   Eosinophils Relative 1 %   Eosinophils Absolute 0.1 0.0 - 0.7 K/uL   Basophils Relative 0 %   Basophils Absolute 0.0 0.0 - 0.1 K/uL   O/Positive/-- (06/18 1621)  Imaging:    MAU Course/MDM: I have ordered labs and reviewed results.  NST reviewed and reactive No evidence of preterm labor Will treat BV with Flagyl, pt to increase PO fluids, follow up in office or return to MAU if  cramping/abdominal pain persists [facility] but unable to do breast US/aspiration today, will schedule on Monday No elevated WBCs, no fever today Consult Dr Alysia Penna with assessment and presentation Give Rocephin 1 g IM here in MAU, then follow with Keflex 500 mg QID x 10 days.  Warm compresses/ice PRN. Percocet 5/325, take 1-2 Q 6 hours PRN x 12 tabs, first dose given in MAU If symptoms of preterm labor or other pregnancy emergencies, pt to return to MAU but if breast abscess is worsening, should go to [facility] or [facility] for possible aspiration or I&D Call [facility] on Monday am for prior authorization for breast US Keep scheduled appt Tuesday with Dr Clearance Coots Pt stable at time of discharge.  Assessment: 1. BV (bacterial vaginosis)   2. Abdominal pain during pregnancy in third trimester   3. Breast abscess during pregnancy, antepartum     Plan: Discharge home with warning signs of systemic infection/reasons to return Labor precautions and fetal kick counts  Allergies as of 12/13/2016   No Known Allergies     Medication List    STOP taking these medications   acetaminophen 500 MG tablet Commonly known as:  TYLENOL   hydrocortisone 25 MG suppository Commonly known as:  ANUSOL-HC   metroNIDAZOLE 500 MG tablet Commonly known as:  FLAGYL     TAKE these medications   albuterol 108 (90 Base) MCG/ACT inhaler Commonly known as:  PROVENTIL HFA;VENTOLIN HFA Inhale 1-2 puffs into the lungs every 6 (six) hours as needed for wheezing or shortness of breath.   cephALEXin 500 MG capsule Commonly known as:  KEFLEX Take 1 capsule (500 mg total) by mouth 4 (four) times daily.   CITRANATAL HARMONY 27-1-260 MG Caps Take 1 capsule by mouth daily before breakfast.   docusate sodium 100 MG capsule Commonly known as:  COLACE Take 1 capsule (100 mg total) by mouth 2 (two) times daily.   oxyCODONE-acetaminophen 5-325 MG tablet Commonly known as:  PERCOCET/ROXICET Take 2 tablets by  mouth once.   Vitamin D 2000 units tablet Take 1 tablet (2,000 Units total) by mouth daily.       Sharen Counter Certified Nurse-Midwife 12/13/2016 7:10 PM

## 2016-12-13 NOTE — MAU Note (Signed)
Urine in lab 

## 2016-12-13 NOTE — MAU Note (Signed)
Sharp pain in right breast and back, and aching in bottom and vagina. Tried tylenol and heating pad and it didn't help.

## 2016-12-16 ENCOUNTER — Other Ambulatory Visit (HOSPITAL_COMMUNITY): Payer: Self-pay | Admitting: Advanced Practice Midwife

## 2016-12-16 ENCOUNTER — Ambulatory Visit
Admission: RE | Admit: 2016-12-16 | Discharge: 2016-12-16 | Disposition: A | Discharge status: Home / Self Care | Payer: Medicaid Other | Source: Ambulatory Visit | Attending: Advanced Practice Midwife | Admitting: Advanced Practice Midwife

## 2016-12-16 ENCOUNTER — Telehealth: Payer: Self-pay | Admitting: *Deleted

## 2016-12-16 DIAGNOSIS — O91119 Abscess of breast associated with pregnancy, unspecified trimester: Secondary | ICD-10-CM

## 2016-12-16 LAB — GC/CHLAMYDIA PROBE AMP (~~LOC~~) NOT AT ARMC
CHLAMYDIA, DNA PROBE: NEGATIVE
NEISSERIA GONORRHEA: NEGATIVE

## 2016-12-16 NOTE — Telephone Encounter (Signed)
Received a voice mail from [facility] 12/13/16 8:19 pm wanting clarification for percocet prescribed by Sharen CounterLisa Leftwich-Kirby that did not have directions. Per chart review patient was seen 12/13/16 and given 12 percocet- take 1-2 every 6 hours prn. Called [facility] to clarify and they state have already dispensed to patient and told her to take per providers verbal instructions which were per patient to take every 4-6 hours as needed.

## 2016-12-17 ENCOUNTER — Encounter: Payer: Self-pay | Admitting: Obstetrics

## 2016-12-17 ENCOUNTER — Ambulatory Visit (INDEPENDENT_AMBULATORY_CARE_PROVIDER_SITE_OTHER): Payer: Medicaid Other | Admitting: Obstetrics

## 2016-12-17 VITALS — BP 128/84 | HR 89 | Wt 224.3 lb

## 2016-12-17 DIAGNOSIS — K5900 Constipation, unspecified: Secondary | ICD-10-CM

## 2016-12-17 DIAGNOSIS — O2243 Hemorrhoids in pregnancy, third trimester: Secondary | ICD-10-CM

## 2016-12-17 DIAGNOSIS — O99613 Diseases of the digestive system complicating pregnancy, third trimester: Secondary | ICD-10-CM

## 2016-12-17 DIAGNOSIS — O91113 Abscess of breast associated with pregnancy, third trimester: Secondary | ICD-10-CM

## 2016-12-17 DIAGNOSIS — O0933 Supervision of pregnancy with insufficient antenatal care, third trimester: Secondary | ICD-10-CM

## 2016-12-17 DIAGNOSIS — O91119 Abscess of breast associated with pregnancy, unspecified trimester: Secondary | ICD-10-CM

## 2016-12-17 DIAGNOSIS — O0993 Supervision of high risk pregnancy, unspecified, third trimester: Secondary | ICD-10-CM

## 2016-12-17 DIAGNOSIS — O2240 Hemorrhoids in pregnancy, unspecified trimester: Secondary | ICD-10-CM

## 2016-12-17 DIAGNOSIS — E6609 Other obesity due to excess calories: Secondary | ICD-10-CM

## 2016-12-17 NOTE — Progress Notes (Signed)
Subjective:  Meghan Pruitt is a 36 y.o. G3P2002 at 9519w2d being seen today for ongoing prenatal care.  She is currently monitored for the following issues for this high-risk pregnancy and has Abdominal pain in pregnancy, second trimester; Back pain during pregnancy in second trimester; Bacterial vaginosis; Encounter for supervision of high risk pregnancy in third trimester, antepartum; and Late prenatal care affecting pregnancy in third trimester on her problem list.  Patient reports right breast abscess, draining, [facility] and started on Keflex.  Contractions: Irregular. Vag. Bleeding: None.  Movement: Present. Denies leaking of fluid.   The following portions of the patient's history were reviewed and updated as appropriate: allergies, current medications, past family history, past medical history, past social history, past surgical history and problem list. Problem list updated.  Objective:   Vitals:   12/17/16 1044  BP: 128/84  Pulse: 89  Weight: 224 lb 4.8 oz (101.7 kg)    Fetal Status:     Movement: Present     General:  Alert, oriented and cooperative. Patient is in no acute distress.  Skin: Skin is warm and dry. No rash noted.   Cardiovascular: Normal heart rate noted  Respiratory: Normal respiratory effort, no problems with respiration noted  Abdomen: Soft, gravid, appropriate for gestational age. Pain/Pressure: Absent     Pelvic:  Cervical exam deferred        Extremities: Normal range of motion.  Edema: None  Mental Status: Normal mood and affect. Normal behavior. Normal judgment and thought content.   Urinalysis:      Assessment and Plan:  Pregnancy: G3P2002 at 3019w2d  1. Encounter for supervision of high risk pregnancy in third trimester, antepartum   2. Obesity due to excess calories without serious comorbidity, unspecified classification   3. Constipation during pregnancy in third trimester   4. Hemorrhoids during pregnancy,  antepartum   5. Late prenatal care affecting pregnancy in third trimester   6. Breast abscess during pregnancy, antepartum Rx: - taking Keflex q 6 hrs x 7 days - culture done today.  May need MRSA coverage.  Will await cultures.  Preterm labor symptoms and general obstetric precautions including but not limited to vaginal bleeding, contractions, leaking of fluid and fetal movement were reviewed in detail with the patient. Please refer to After Visit Summary for other counseling recommendations.  Return in about 1 week (around 12/24/2016) for ROB.   Brock BadHarper, Charles A, MD  Patient ID: Meghan Pruitt, female   DOB: 01/26/1981, 36 y.o.   MRN: [id number]

## 2016-12-17 NOTE — Progress Notes (Signed)
Patient reports good fetal movement, and states she has contractions every now and again. Pt states she is scheduled for follow up at the breast center on 12-23-16 for an abscess.

## 2016-12-19 LAB — STREP GP B NAA: Strep Gp B NAA: NEGATIVE

## 2016-12-21 LAB — WOUND CULTURE: ORGANISM ID, BACTERIA: NONE SEEN

## 2016-12-23 ENCOUNTER — Inpatient Hospital Stay: Admission: RE | Admit: 2016-12-23 | Payer: Medicaid Other | Source: Ambulatory Visit

## 2016-12-27 ENCOUNTER — Encounter: Payer: Self-pay | Admitting: Obstetrics

## 2016-12-27 ENCOUNTER — Ambulatory Visit (INDEPENDENT_AMBULATORY_CARE_PROVIDER_SITE_OTHER): Payer: Medicaid Other | Admitting: Obstetrics

## 2016-12-27 VITALS — BP 130/88 | HR 117 | Wt 220.9 lb

## 2016-12-27 DIAGNOSIS — O0933 Supervision of pregnancy with insufficient antenatal care, third trimester: Secondary | ICD-10-CM

## 2016-12-27 DIAGNOSIS — O0993 Supervision of high risk pregnancy, unspecified, third trimester: Secondary | ICD-10-CM

## 2016-12-27 DIAGNOSIS — F17210 Nicotine dependence, cigarettes, uncomplicated: Secondary | ICD-10-CM

## 2016-12-27 DIAGNOSIS — E6609 Other obesity due to excess calories: Secondary | ICD-10-CM

## 2016-12-27 NOTE — Progress Notes (Signed)
Subjective:  Meghan Pruitt is a 36 y.o. G3P2002 at 970w5d being seen today for ongoing prenatal care.  She is currently monitored for the following issues for this low-risk pregnancy and has Abdominal pain in pregnancy, second trimester; Back pain during pregnancy in second trimester; Bacterial vaginosis; Encounter for supervision of high risk pregnancy in third trimester, antepartum; and Late prenatal care affecting pregnancy in third trimester on her problem list.  Patient reports no complaints.  Contractions: Irregular. Vag. Bleeding: None.  Movement: Present. Denies leaking of fluid.   The following portions of the patient's history were reviewed and updated as appropriate: allergies, current medications, past family history, past medical history, past social history, past surgical history and problem list. Problem list updated.  Objective:   Vitals:   12/27/16 0919  BP: 130/88  Pulse: (!) 117  Weight: 220 lb 14.4 oz (100.2 kg)    Fetal Status:     Movement: Present     General:  Alert, oriented and cooperative. Patient is in no acute distress.  Skin: Skin is warm and dry. No rash noted.   Cardiovascular: Normal heart rate noted  Respiratory: Normal respiratory effort, no problems with respiration noted  Abdomen: Soft, gravid, appropriate for gestational age. Pain/Pressure: Present     Pelvic:  Cervical exam deferred        Extremities: Normal range of motion.  Edema: None  Mental Status: Normal mood and affect. Normal behavior. Normal judgment and thought content.   Urinalysis:      Assessment and Plan:  Pregnancy: G3P2002 at 3170w5d  1. Encounter for supervision of high risk pregnancy in third trimester, antepartum   2. Obesity due to excess calories without serious comorbidity, unspecified classification   3. Late prenatal care affecting pregnancy in third trimester   4. Tobacco dependence due to cigarettes  Preterm labor symptoms and general obstetric precautions  including but not limited to vaginal bleeding, contractions, leaking of fluid and fetal movement were reviewed in detail with the patient. Please refer to After Visit Summary for other counseling recommendations.  Return in about 1 week (around 01/03/2017) for ROB.   Brock BadHarper, Lavella Myren A, MD

## 2016-12-28 ENCOUNTER — Emergency Department (HOSPITAL_COMMUNITY)
Admission: EM | Admit: 2016-12-28 | Discharge: 2016-12-28 | Disposition: A | Discharge status: Home / Self Care | Payer: Medicaid Other | Attending: Emergency Medicine | Admitting: Emergency Medicine

## 2016-12-28 ENCOUNTER — Encounter (HOSPITAL_COMMUNITY): Payer: Self-pay

## 2016-12-28 DIAGNOSIS — F17210 Nicotine dependence, cigarettes, uncomplicated: Secondary | ICD-10-CM | POA: Insufficient documentation

## 2016-12-28 DIAGNOSIS — L0291 Cutaneous abscess, unspecified: Secondary | ICD-10-CM | POA: Diagnosis present

## 2016-12-28 DIAGNOSIS — L732 Hidradenitis suppurativa: Secondary | ICD-10-CM | POA: Insufficient documentation

## 2016-12-28 DIAGNOSIS — Z79899 Other long term (current) drug therapy: Secondary | ICD-10-CM | POA: Diagnosis not present

## 2016-12-28 MED ORDER — LIDOCAINE HCL 1 % IJ SOLN
INTRAMUSCULAR | Status: AC
  Filled 2016-12-28: qty 20

## 2016-12-28 MED ORDER — LIDOCAINE HCL 2 % IJ SOLN
5.0000 mL | Freq: Once | INTRAMUSCULAR | Status: AC
  Administered 2016-12-28: 100 mg

## 2016-12-28 MED ORDER — LIDOCAINE HCL 2 % IJ SOLN
INTRAMUSCULAR | Status: AC
  Filled 2016-12-28: qty 20

## 2016-12-28 MED ORDER — CLINDAMYCIN HCL 300 MG PO CAPS: 300.0000 mg | ORAL_CAPSULE | Freq: Three times a day (TID) | ORAL | 0 refills | Status: AC

## 2016-12-28 MED ORDER — ACETAMINOPHEN 500 MG PO TABS: 500.0000 mg | ORAL_TABLET | Freq: Four times a day (QID) | ORAL | 0 refills | Status: DC | PRN

## 2016-12-28 NOTE — ED Triage Notes (Signed)
Pt gets abscess often.  Now with one under left axilla x 2 days.  Pt is also [redacted] weeks pregnant.  Having contractions. No discharge.  States told yesterday that it is braxton hicks contractions.

## 2016-12-28 NOTE — Progress Notes (Signed)
Spoke with Dr. Vergie LivingPickens. Pt is a G3P2, both vaginal deliveries. She Is 36 6/[redacted] weeks gestation here with pain from a boil under her left arm.No vaginal bleeding or leaking of fluid. FHR tracing is a category1 with no uc's. Pt says she feels occasional mild uc's. [facility] Pt is OB cleared. ED staff notified.

## 2016-12-28 NOTE — ED Notes (Signed)
Pt reports contractions are sporadic and not regular. No vaginal discharge. Pt NAD.

## 2016-12-28 NOTE — Progress Notes (Signed)
Pt is a G3P2 at 9036 6/[redacted] weeks gestation here with c/o pain from a boil that is under her left arm. She denies any complications with this pregnancy and [facility]. No vaginal bleeding or leaking of fluid. Says she has been feeling the baby move.

## 2016-12-28 NOTE — ED Notes (Signed)
ED Provider at bedside. 

## 2016-12-28 NOTE — ED Notes (Signed)
OB rapid response at bedside 

## 2016-12-28 NOTE — ED Provider Notes (Signed)
[facility]-EMERGENCY DEPT Provider Note   CSN: [id number] Arrival date & time: 12/28/16  0900     History   Chief Complaint Chief Complaint  Patient presents with  . Abscess  . Contractions    HPI Georgann Housekeeperndear C Dinovo is a 36 y.o. female currently [redacted] weeks pregnant who presents with left axilla pain and contractions. Patient reports left axilla boil that she noticed two days ago and has been trying to use warm compresses without any change. Patient reports a history of recurrent abscesses, with the last one on her right breast about two week ago. Patient was started on Keflex which she completed a week ago. Currently, patient reports moderate/severe pain in her left axilla. Patient also reports some mild contractions. She was recently seen in the ED for similar contractions and diagnosed with braxton hicks. Patient denies any fever, chills, CP, SOB, abdominal pain.    HPI  Past Medical History:  Diagnosis Date  . Abdominal pain in pregnancy, second trimester 09/14/2016  . Back pain during pregnancy in second trimester 09/14/2016  . Bacterial vaginosis 09/14/2016  . Chronic bronchitis [facility](H[facility])     Patient Active Problem List   Diagnosis Date Noted  . Encounter for supervision of high risk pregnancy in third trimester, antepartum 11/25/2016  . Late prenatal care affecting pregnancy in third trimester 11/25/2016  . Abdominal pain in pregnancy, second trimester 09/14/2016  . Back pain during pregnancy in second trimester 09/14/2016  . Bacterial vaginosis 09/14/2016    Past Surgical History:  Procedure Laterality Date  . BREAST BIOPSY Right 02/25/2014   abscess  . NO PAST SURGERIES      OB History    Gravida Para Term Preterm AB Living   3 2 2     2    SAB TAB Ectopic Multiple Live Births                   Home Medications    Prior to Admission medications   Medication Sig Start Date End Date Taking? Authorizing Provider  acetaminophen (TYLENOL) 500 MG tablet Take 1,000 mg by mouth  every 6 (six) hours as needed for mild pain or moderate pain.   Yes [provider]  albuterol (PROVENTIL HFA;VENTOLIN HFA) 108 (90 Base) MCG/ACT inhaler Inhale 1-2 puffs into the lungs every 6 (six) hours as needed for wheezing or shortness of breath. 09/29/15  Yes West, Emily, PA-C  Cholecalciferol (VITAMIN D) 2000 units tablet Take 1 tablet (2,000 Units total) by mouth daily. 11/26/16  Yes Brock BadHarper, Charles A, MD  docusate sodium (COLACE) 100 MG capsule Take 1 capsule (100 mg total) by mouth 2 (two) times daily. 11/25/16  Yes Brock BadHarper, Charles A, MD  oxyCODONE-acetaminophen (PERCOCET/ROXICET) 5-325 MG tablet Take 2 tablets by mouth every 6 (six) hours as needed for moderate pain.    Yes [provider]  Prenat-FeFmCb-DSS-FA-DHA w/o A (CITRANATAL HARMONY) 27-1-260 MG CAPS Take 1 capsule by mouth daily before breakfast. 11/25/16  Yes Brock BadHarper, Charles A, MD  acetaminophen (TYLENOL) 500 MG tablet Take 1 tablet (500 mg total) by mouth every 6 (six) hours as needed. 12/28/16 12/28/17  Bijou Easler, Lilia ArgueAbdoulaye, MD  cephALEXin (KEFLEX) 500 MG capsule Take 500 mg by mouth 4 (four) times daily.    [provider]  clindamycin (CLEOCIN) 300 MG capsule Take 1 capsule (300 mg total) by mouth 3 (three) times daily. 12/28/16 01/04/17  Lovena Neighboursiallo, Khamiya Varin, MD    Family History Family History  Problem Relation Age of Onset  . Heart  disease Maternal Grandfather   . Heart disease Paternal Grandmother   . Breast cancer Paternal Aunt     Social History Social History  Substance Use Topics  . Smoking status: Current Some Day Smoker    Packs/day: 0.25    Years: 7.00    Types: Cigarettes  . Smokeless tobacco: Current User     Comment: only smokes about 1 cigarette a day - causes sickness  . Alcohol use No     Comment: occasionally     Allergies   Patient has no known allergies.   Review of Systems Review of Systems  Constitutional: Negative.   HENT: Negative.   Eyes: Negative.     Cardiovascular: Negative.   Gastrointestinal: Negative.   Endocrine: Negative.   Genitourinary: Negative.   Musculoskeletal:       Left axilla pain  Allergic/Immunologic: Negative.      Physical Exam Updated Vital Signs BP 130/86 (BP Location: Right Arm)   Pulse 97   Temp 97.8 F (36.6 C) (Oral)   Resp (!) 24   LMP 04/14/2016   SpO2 96%   Physical Exam  Constitutional: She is oriented to person, place, and time. She appears well-developed. She is cooperative.  Eyes: Pupils are equal, round, and reactive to light. EOM are normal.  Neck: Normal range of motion. Neck supple.  Cardiovascular: Normal rate, regular rhythm and normal heart sounds.   Pulmonary/Chest: Effort normal and breath sounds normal.  Abdominal: There is no tenderness.  Gravid abdomen appropriate for gestational age  Musculoskeletal: Normal range of motion.  Left axilla abscess measuring 5 cm x 2 cm. Tender to palpation. One purulent head non draining with surrounding induration  Neurological: She is alert and oriented to person, place, and time.  Skin: Skin is warm and dry.  Psychiatric: She has a normal mood and affect. Her behavior is normal.     ED Treatments / Results  Labs (all labs ordered are listed, but only abnormal results are displayed) Labs Reviewed - No data to display  EKG  EKG Interpretation None       Radiology No results found.  Procedures .Marland KitchenIncision and Drainage Date/Time: 12/28/2016 3:01 PM Performed by: Lovena Neighbours Authorized by: Alvira Monday   Consent:    Consent obtained:  Verbal   Consent given by:  Patient   Risks discussed:  Bleeding, incomplete drainage and pain   Alternatives discussed:  Alternative treatment Location:    Type:  Abscess   Size:  5x2 cm   Location:  Upper extremity   Upper extremity location: Axilla. Pre-procedure details:    Skin preparation:  Betadine Anesthesia (see MAR for exact dosages):    Anesthesia method:  Local  infiltration   Local anesthetic:  Lidocaine 2% w/o epi Procedure type:    Complexity:  Simple Procedure details:    Needle aspiration: no     Incision types:  Stab incision   Incision depth:  Subcutaneous   Scalpel blade:  15   Wound management:  Probed and deloculated   Drainage:  Purulent   Drainage amount:  Moderate   Packing materials:  1/2 in iodoform gauze Post-procedure details:    Patient tolerance of procedure:  Tolerated with difficulty   (including critical care time)  Medications Ordered in ED Medications  lidocaine (XYLOCAINE) 2 % (with pres) injection 100 mg (100 mg Infiltration Given by Other 12/28/16 1241)     Initial Impression / Assessment and Plan / ED Course  Patient is a 36  yo female who presented with large left axilla abscess. On exam tender to palpation with mild central fluctuance with no drainage. Patient has had recurrent abscess in the past consistent with hidradenitis suppurativa. Given size of abscess I&D was performed. Patient tolerated procedure well and wound was packed. Patient was discharged on clindamycin for 7 days, ibuprofen and tylenol. Given reported contractions on admission to the ED, OB rapid response team was called. Patient was monitored with tocolysis and fetal heart were within normal limit with reassuring tracings. Contractions were consistent with Braxton-Hicks.  I have reviewed the tri age vital signs and the nursing notes.  Pertinent labs & imaging results that were available during my care of the patient were reviewed by me and considered in my medical decision making (see chart for details).    Final Clinical Impressions(s) / ED Diagnoses   Final diagnoses:  Hidradenitis suppurativa  Abscess    New Prescriptions New Prescriptions   ACETAMINOPHEN (TYLENOL) 500 MG TABLET    Take 1 tablet (500 mg total) by mouth every 6 (six) hours as needed.   CLINDAMYCIN (CLEOCIN) 300 MG CAPSULE    Take 1 capsule (300 mg total) by mouth 3  (three) times daily.     Lovena Neighbours, MD 12/28/16 1504    Alvira Monday, MD 12/30/16 [phone]

## 2016-12-28 NOTE — Discharge Instructions (Signed)
We will send you home on clindamycin  an antibiotic  you will take for 7 days. You can use tylenol for pain. Packing will fall off on its own. Please make sure you follow up with your primary care provider

## 2017-01-03 ENCOUNTER — Ambulatory Visit (INDEPENDENT_AMBULATORY_CARE_PROVIDER_SITE_OTHER): Payer: Medicaid Other | Admitting: Obstetrics

## 2017-01-03 ENCOUNTER — Encounter: Payer: Self-pay | Admitting: Obstetrics

## 2017-01-03 VITALS — BP 137/88 | HR 102 | Wt 225.0 lb

## 2017-01-03 DIAGNOSIS — F17210 Nicotine dependence, cigarettes, uncomplicated: Secondary | ICD-10-CM

## 2017-01-03 DIAGNOSIS — O0993 Supervision of high risk pregnancy, unspecified, third trimester: Secondary | ICD-10-CM

## 2017-01-03 DIAGNOSIS — O0933 Supervision of pregnancy with insufficient antenatal care, third trimester: Secondary | ICD-10-CM

## 2017-01-03 DIAGNOSIS — E6609 Other obesity due to excess calories: Secondary | ICD-10-CM

## 2017-01-03 NOTE — Progress Notes (Signed)
Subjective:  Meghan Pruitt is a 36 y.o. G3P2002 at 2524w5d being seen today for ongoing prenatal care.  She is currently monitored for the following issues for this high-risk pregnancy and has Abdominal pain in pregnancy, second trimester; Back pain during pregnancy in second trimester; Bacterial vaginosis; Encounter for supervision of high risk pregnancy in third trimester, antepartum; and Late prenatal care affecting pregnancy in third trimester on her problem list.  Patient reports backache.  Contractions: Irregular. Vag. Bleeding: None.  Movement: Present. Denies leaking of fluid.   The following portions of the patient's history were reviewed and updated as appropriate: allergies, current medications, past family history, past medical history, past social history, past surgical history and problem list. Problem list updated.  Objective:   Vitals:   01/03/17 1014  BP: 137/88  Pulse: (!) 102  Weight: 225 lb (102.1 kg)    Fetal Status: Fetal Heart Rate (bpm): 150   Movement: Present     General:  Alert, oriented and cooperative. Patient is in no acute distress.  Skin: Skin is warm and dry. No rash noted.   Cardiovascular: Normal heart rate noted  Respiratory: Normal respiratory effort, no problems with respiration noted  Abdomen: Soft, gravid, appropriate for gestational age. Pain/Pressure: Present     Pelvic:  Cervical exam deferred        Extremities: Normal range of motion.  Edema: None  Mental Status: Normal mood and affect. Normal behavior. Normal judgment and thought content.   Urinalysis: Urine Protein: Negative Urine Glucose: Negative  Assessment and Plan:  Pregnancy: G3P2002 at 5724w5d  1. Encounter for supervision of high risk pregnancy in third trimester, antepartum   2. Obesity due to excess calories without serious comorbidity, unspecified classification   3. Late prenatal care affecting pregnancy in third trimester   4. Tobacco dependence due to  cigarettes   Term labor symptoms and general obstetric precautions including but not limited to vaginal bleeding, contractions, leaking of fluid and fetal movement were reviewed in detail with the patient. Please refer to After Visit Summary for other counseling recommendations.  Return in about 1 week (around 01/10/2017) for ROB.   Brock BadHarper, Sybil Shrader A, MDPatient ID: Meghan Pruitt, female   DOB: 02/08/1981, 36 y.o.   MRN: [id number]

## 2017-01-03 NOTE — Progress Notes (Signed)
Patient is having some whitish discharge- not bothersome.

## 2017-01-07 ENCOUNTER — Ambulatory Visit
Admission: RE | Admit: 2017-01-07 | Discharge: 2017-01-07 | Disposition: A | Discharge status: Home / Self Care | Payer: Medicaid Other | Source: Ambulatory Visit | Attending: Advanced Practice Midwife | Admitting: Advanced Practice Midwife

## 2017-01-07 ENCOUNTER — Other Ambulatory Visit (HOSPITAL_COMMUNITY): Payer: Self-pay | Admitting: Advanced Practice Midwife

## 2017-01-07 DIAGNOSIS — O91119 Abscess of breast associated with pregnancy, unspecified trimester: Secondary | ICD-10-CM

## 2017-01-09 DIAGNOSIS — O91119 Abscess of breast associated with pregnancy, unspecified trimester: Secondary | ICD-10-CM | POA: Insufficient documentation

## 2017-01-10 ENCOUNTER — Ambulatory Visit (INDEPENDENT_AMBULATORY_CARE_PROVIDER_SITE_OTHER): Payer: Medicaid Other | Admitting: Obstetrics

## 2017-01-10 ENCOUNTER — Encounter: Payer: Self-pay | Admitting: Obstetrics

## 2017-01-10 VITALS — BP 128/80 | HR 92 | Wt 223.6 lb

## 2017-01-10 DIAGNOSIS — O99213 Obesity complicating pregnancy, third trimester: Secondary | ICD-10-CM

## 2017-01-10 DIAGNOSIS — E6609 Other obesity due to excess calories: Secondary | ICD-10-CM

## 2017-01-10 DIAGNOSIS — F17210 Nicotine dependence, cigarettes, uncomplicated: Secondary | ICD-10-CM

## 2017-01-10 DIAGNOSIS — O0993 Supervision of high risk pregnancy, unspecified, third trimester: Secondary | ICD-10-CM

## 2017-01-10 DIAGNOSIS — O0933 Supervision of pregnancy with insufficient antenatal care, third trimester: Secondary | ICD-10-CM

## 2017-01-10 NOTE — Progress Notes (Signed)
Patient is concerned about her [facility] Results.

## 2017-01-10 NOTE — Progress Notes (Signed)
Subjective:  Meghan Pruitt is a 36 y.o. G3P2002 at 7253w5d being seen today for ongoing prenatal care.  She is currently monitored for the following issues for this high-risk pregnancy and has Abdominal pain in pregnancy, second trimester; Back pain during pregnancy in second trimester; Bacterial vaginosis; Encounter for supervision of high risk pregnancy in third trimester, antepartum; Late prenatal care affecting pregnancy in third trimester; and Breast abscess during pregnancy, antepartum on her problem list.  Patient reports no complaints.  Contractions: Irregular. Vag. Bleeding: None.  Movement: Present. Denies leaking of fluid.   The following portions of the patient's history were reviewed and updated as appropriate: allergies, current medications, past family history, past medical history, past social history, past surgical history and problem list. Problem list updated.  Objective:   Vitals:   01/10/17 1017  BP: 128/80  Pulse: 92  Weight: 223 lb 9.6 oz (101.4 kg)    Fetal Status: Fetal Heart Rate (bpm): 150   Movement: Present     General:  Alert, oriented and cooperative. Patient is in no acute distress.  Skin: Skin is warm and dry. No rash noted.   Cardiovascular: Normal heart rate noted  Respiratory: Normal respiratory effort, no problems with respiration noted  Abdomen: Soft, gravid, appropriate for gestational age. Pain/Pressure: Present     Pelvic:  Cervical exam deferred        Extremities: Normal range of motion.  Edema: None  Mental Status: Normal mood and affect. Normal behavior. Normal judgment and thought content.   Urinalysis:      Assessment and Plan:  Pregnancy: G3P2002 at 6253w5d  1. Encounter for supervision of high risk pregnancy in third trimester, antepartum   2. Obesity due to excess calories without serious comorbidity, unspecified classification   3. Late prenatal care affecting pregnancy in third trimester   4. Tobacco dependence due to  cigarettes   Term labor symptoms and general obstetric precautions including but not limited to vaginal bleeding, contractions, leaking of fluid and fetal movement were reviewed in detail with the patient. Please refer to After Visit Summary for other counseling recommendations.  Return in about 1 week (around 01/17/2017) for ROB.   Brock BadHarper, Charles A, MD

## 2017-01-14 ENCOUNTER — Ambulatory Visit
Admission: RE | Admit: 2017-01-14 | Discharge: 2017-01-14 | Disposition: A | Discharge status: Home / Self Care | Payer: Medicaid Other | Source: Ambulatory Visit | Attending: Advanced Practice Midwife | Admitting: Advanced Practice Midwife

## 2017-01-14 ENCOUNTER — Other Ambulatory Visit (HOSPITAL_COMMUNITY): Payer: Self-pay | Admitting: Advanced Practice Midwife

## 2017-01-14 DIAGNOSIS — O91119 Abscess of breast associated with pregnancy, unspecified trimester: Secondary | ICD-10-CM

## 2017-01-17 ENCOUNTER — Encounter: Payer: Self-pay | Admitting: Obstetrics

## 2017-01-17 ENCOUNTER — Ambulatory Visit (INDEPENDENT_AMBULATORY_CARE_PROVIDER_SITE_OTHER): Payer: Medicaid Other | Admitting: Obstetrics

## 2017-01-17 VITALS — BP 121/82 | HR 107 | Wt 226.0 lb

## 2017-01-17 DIAGNOSIS — O91119 Abscess of breast associated with pregnancy, unspecified trimester: Secondary | ICD-10-CM

## 2017-01-17 DIAGNOSIS — Z3483 Encounter for supervision of other normal pregnancy, third trimester: Secondary | ICD-10-CM

## 2017-01-17 DIAGNOSIS — Z3480 Encounter for supervision of other normal pregnancy, unspecified trimester: Secondary | ICD-10-CM

## 2017-01-17 MED ORDER — AMOXICILLIN-POT CLAVULANATE 875-125 MG PO TABS: 1.0000 | ORAL_TABLET | Freq: Two times a day (BID) | ORAL | 1 refills | Status: DC

## 2017-01-17 NOTE — Progress Notes (Signed)
Subjective:  Meghan Pruitt is a 36 y.o. G3P2002 at 6235w5d being seen today for ongoing prenatal care.  She is currently monitored for the following issues for this low-risk pregnancy and has Abdominal pain in pregnancy, second trimester; Back pain during pregnancy in second trimester; Bacterial vaginosis; Encounter for supervision of high risk pregnancy in third trimester, antepartum; Late prenatal care affecting pregnancy in third trimester; and Breast abscess during pregnancy, antepartum on her problem list.  Patient reports no complaints.  Contractions: Irregular. Vag. Bleeding: None.  Movement: Present. Denies leaking of fluid.   The following portions of the patient's history were reviewed and updated as appropriate: allergies, current medications, past family history, past medical history, past social history, past surgical history and problem list. Problem list updated.  Objective:   Vitals:   01/17/17 1005 01/17/17 1007  BP: (!) 147/96 121/82  Pulse: (!) 107   Weight: 226 lb (102.5 kg)     Fetal Status: Fetal Heart Rate (bpm): 150   Movement: Present     General:  Alert, oriented and cooperative. Patient is in no acute distress.  Skin: Skin is warm and dry. No rash noted.   Cardiovascular: Normal heart rate noted  Respiratory: Normal respiratory effort, no problems with respiration noted  Abdomen: Soft, gravid, appropriate for gestational age. Pain/Pressure: Present     Pelvic:  Cervical exam performed      Cvx:  2 cm / 50% / -3 / Vtx  Extremities: Normal range of motion.  Edema: None  Mental Status: Normal mood and affect. Normal behavior. Normal judgment and thought content.   Urinalysis:      Assessment and Plan:  Pregnancy: G3P2002 at 4935w5d  1. Supervision of other normal pregnancy, antepartum   2. Breast abscess during pregnancy, antepartum Rx: - amoxicillin-clavulanate (AUGMENTIN) 875-125 MG tablet; Take 1 tablet by mouth 2 (two) times daily.  Dispense: 20 tablet;  Refill: 1  Term labor symptoms and general obstetric precautions including but not limited to vaginal bleeding, contractions, leaking of fluid and fetal movement were reviewed in detail with the patient. Please refer to After Visit Summary for other counseling recommendations.  Return in about 1 week (around 01/24/2017) for ROB.   Brock BadHarper, Leonda Cristo A, MD

## 2017-01-24 ENCOUNTER — Encounter: Payer: Self-pay | Admitting: Certified Nurse Midwife

## 2017-01-24 ENCOUNTER — Ambulatory Visit (INDEPENDENT_AMBULATORY_CARE_PROVIDER_SITE_OTHER): Payer: Medicaid Other | Admitting: Certified Nurse Midwife

## 2017-01-24 ENCOUNTER — Ambulatory Visit (HOSPITAL_COMMUNITY)
Admission: RE | Admit: 2017-01-24 | Discharge: 2017-01-24 | Disposition: A | Discharge status: Home / Self Care | Payer: Medicaid Other | Source: Ambulatory Visit | Attending: Certified Nurse Midwife | Admitting: Certified Nurse Midwife

## 2017-01-24 ENCOUNTER — Inpatient Hospital Stay (HOSPITAL_COMMUNITY)
Admission: AD | Admit: 2017-01-24 | Discharge: 2017-01-24 | Disposition: A | Discharge status: Home / Self Care | Payer: Medicaid Other | Source: Ambulatory Visit | Attending: Obstetrics and Gynecology | Admitting: Obstetrics and Gynecology

## 2017-01-24 ENCOUNTER — Encounter (HOSPITAL_COMMUNITY): Payer: Self-pay | Admitting: *Deleted

## 2017-01-24 ENCOUNTER — Telehealth (HOSPITAL_COMMUNITY): Payer: Self-pay | Admitting: *Deleted

## 2017-01-24 ENCOUNTER — Other Ambulatory Visit: Payer: Self-pay | Admitting: Advanced Practice Midwife

## 2017-01-24 VITALS — BP 129/86 | HR 121 | Wt 223.0 lb

## 2017-01-24 DIAGNOSIS — Z3A40 40 weeks gestation of pregnancy: Secondary | ICD-10-CM | POA: Diagnosis not present

## 2017-01-24 DIAGNOSIS — Z3483 Encounter for supervision of other normal pregnancy, third trimester: Secondary | ICD-10-CM

## 2017-01-24 DIAGNOSIS — O288 Other abnormal findings on antenatal screening of mother: Secondary | ICD-10-CM | POA: Insufficient documentation

## 2017-01-24 DIAGNOSIS — Z3480 Encounter for supervision of other normal pregnancy, unspecified trimester: Secondary | ICD-10-CM

## 2017-01-24 DIAGNOSIS — O480 Post-term pregnancy: Secondary | ICD-10-CM

## 2017-01-24 DIAGNOSIS — O0933 Supervision of pregnancy with insufficient antenatal care, third trimester: Secondary | ICD-10-CM

## 2017-01-24 NOTE — MAU Note (Signed)
Patient was not to sign into MAU, was sent for OP ultrasound.

## 2017-01-24 NOTE — Telephone Encounter (Signed)
Preadmission screen  

## 2017-01-24 NOTE — Progress Notes (Signed)
   PRENATAL VISIT NOTE  Subjective:  Meghan Pruitt is a 36 y.o. G3P2002 at [redacted]w[redacted]d being seen today for ongoing prenatal care.  She is currently monitored for the following issues for this low-risk pregnancy and has Bacterial vaginosis; Supervision of other normal pregnancy, antepartum; Late prenatal care affecting pregnancy in third trimester; and Breast abscess during pregnancy, antepartum on her problem list.  Patient reports no complaints.  Contractions: Irregular. Vag. Bleeding: None.  Movement: Present. Denies leaking of fluid.   The following portions of the patient's history were reviewed and updated as appropriate: allergies, current medications, past family history, past medical history, past social history, past surgical history and problem list. Problem list updated.  Objective:   Vitals:   01/24/17 1102  BP: 129/86  Pulse: (!) 121  Weight: 223 lb (101.2 kg)    Fetal Status:     Movement: Present     General:  Alert, oriented and cooperative. Patient is in no acute distress.  Skin: Skin is warm and dry. No rash noted.   Cardiovascular: Normal heart rate noted  Respiratory: Normal respiratory effort, no problems with respiration noted  Abdomen: Soft, gravid, appropriate for gestational age.  Pain/Pressure: Present     Pelvic: Cervical exam deferred        Extremities: Normal range of motion.  Edema: None  Mental Status:  Normal mood and affect. Normal behavior. Normal judgment and thought content.  NST: no accels, no decels, moderate variability, Cat. 1 tracing. No contractions on toco.   Assessment and Plan:  Pregnancy: G3P2002 at [redacted]w[redacted]d  1. Supervision of other normal pregnancy, antepartum      Non-reactive NST.   2. Post-term pregnancy, 40-42 weeks of gestation      - Fetal nonstress test; Future  3. Late prenatal care affecting pregnancy in third trimester        4. Non-reactive NST (non-stress test)     - [place] MFM FETAL BPP WO NON STRESS; Future  Term  labor symptoms and general obstetric precautions including but not limited to vaginal bleeding, contractions, leaking of fluid and fetal movement were reviewed in detail with the patient. Please refer to After Visit Summary for other counseling recommendations.  Return in about 4 weeks (around 02/21/2017) for Postpartum.   Roe Coombs, CNM

## 2017-01-26 ENCOUNTER — Inpatient Hospital Stay (HOSPITAL_COMMUNITY): Payer: Medicaid Other | Admitting: Anesthesiology

## 2017-01-26 ENCOUNTER — Encounter (HOSPITAL_COMMUNITY): Payer: Self-pay

## 2017-01-26 ENCOUNTER — Inpatient Hospital Stay (HOSPITAL_COMMUNITY)
Admission: RE | Admit: 2017-01-26 | Discharge: 2017-01-28 | DRG: 775 | Disposition: A | Discharge status: Home / Self Care | Payer: Medicaid Other | Source: Ambulatory Visit | Attending: Obstetrics & Gynecology | Admitting: Obstetrics & Gynecology

## 2017-01-26 DIAGNOSIS — O480 Post-term pregnancy: Secondary | ICD-10-CM | POA: Diagnosis present

## 2017-01-26 DIAGNOSIS — Z6839 Body mass index (BMI) 39.0-39.9, adult: Secondary | ICD-10-CM

## 2017-01-26 DIAGNOSIS — O99214 Obesity complicating childbirth: Secondary | ICD-10-CM | POA: Diagnosis present

## 2017-01-26 DIAGNOSIS — Z3A41 41 weeks gestation of pregnancy: Secondary | ICD-10-CM

## 2017-01-26 DIAGNOSIS — F17210 Nicotine dependence, cigarettes, uncomplicated: Secondary | ICD-10-CM | POA: Diagnosis present

## 2017-01-26 DIAGNOSIS — O99334 Smoking (tobacco) complicating childbirth: Secondary | ICD-10-CM | POA: Diagnosis present

## 2017-01-26 LAB — TYPE AND SCREEN
ABO/RH(D): O POS
ANTIBODY SCREEN: NEGATIVE

## 2017-01-26 LAB — CBC
HEMATOCRIT: 35.9 % — AB (ref 36.0–46.0)
Hemoglobin: 12.1 g/dL (ref 12.0–15.0)
MCH: 30.4 pg (ref 26.0–34.0)
MCHC: 33.7 g/dL (ref 30.0–36.0)
MCV: 90.2 fL (ref 78.0–100.0)
PLATELETS: 183 10*3/uL (ref 150–400)
RBC: 3.98 MIL/uL (ref 3.87–5.11)
RDW: 13.9 % (ref 11.5–15.5)
WBC: 9.4 10*3/uL (ref 4.0–10.5)

## 2017-01-26 LAB — ABO/RH: ABO/RH(D): O POS

## 2017-01-26 MED ORDER — LIDOCAINE HCL (PF) 1 % IJ SOLN
INTRAMUSCULAR | Status: DC | PRN
  Administered 2017-01-26 (×2): 4 mL

## 2017-01-26 MED ORDER — SIMETHICONE 80 MG PO CHEW: 80.0000 mg | CHEWABLE_TABLET | ORAL | Status: DC | PRN

## 2017-01-26 MED ORDER — LIDOCAINE HCL (PF) 1 % IJ SOLN
30.0000 mL | INTRAMUSCULAR | Status: DC | PRN
  Filled 2017-01-26: qty 30

## 2017-01-26 MED ORDER — ONDANSETRON HCL 4 MG/2ML IJ SOLN: 4.0000 mg | Freq: Four times a day (QID) | INTRAMUSCULAR | Status: DC | PRN

## 2017-01-26 MED ORDER — PHENYLEPHRINE 40 MCG/ML (10ML) SYRINGE FOR IV PUSH (FOR BLOOD PRESSURE SUPPORT)
PREFILLED_SYRINGE | INTRAVENOUS | Status: AC
  Filled 2017-01-26: qty 20

## 2017-01-26 MED ORDER — TETANUS-DIPHTH-ACELL PERTUSSIS 5-2.5-18.5 LF-MCG/0.5 IM SUSP: 0.5000 mL | Freq: Once | INTRAMUSCULAR | Status: DC

## 2017-01-26 MED ORDER — PHENYLEPHRINE 40 MCG/ML (10ML) SYRINGE FOR IV PUSH (FOR BLOOD PRESSURE SUPPORT)
80.0000 ug | PREFILLED_SYRINGE | INTRAVENOUS | Status: DC | PRN
  Filled 2017-01-26: qty 5

## 2017-01-26 MED ORDER — FENTANYL 2.5 MCG/ML BUPIVACAINE 1/10 % EPIDURAL INFUSION (WH - ANES): 14.0000 mL/h | INTRAMUSCULAR | Status: DC | PRN

## 2017-01-26 MED ORDER — OXYTOCIN BOLUS FROM INFUSION
500.0000 mL | Freq: Once | INTRAVENOUS | Status: AC
  Administered 2017-01-26: 500 mL via INTRAVENOUS

## 2017-01-26 MED ORDER — ACETAMINOPHEN 325 MG PO TABS
650.0000 mg | ORAL_TABLET | ORAL | Status: DC | PRN
  Administered 2017-01-27 (×2): 650 mg via ORAL
  Filled 2017-01-26 (×2): qty 2

## 2017-01-26 MED ORDER — SOD CITRATE-CITRIC ACID 500-334 MG/5ML PO SOLN: 30.0000 mL | ORAL | Status: DC | PRN

## 2017-01-26 MED ORDER — PRENATAL MULTIVITAMIN CH
1.0000 | ORAL_TABLET | Freq: Every day | ORAL | Status: DC
  Administered 2017-01-27: 1 via ORAL
  Filled 2017-01-26 (×2): qty 1

## 2017-01-26 MED ORDER — BENZOCAINE-MENTHOL 20-0.5 % EX AERO
1.0000 | INHALATION_SPRAY | CUTANEOUS | Status: DC | PRN
Start: 2017-01-26 — End: 2017-01-28
  Administered 2017-01-27: 1 via TOPICAL
  Filled 2017-01-26: qty 56

## 2017-01-26 MED ORDER — IBUPROFEN 600 MG PO TABS
600.0000 mg | ORAL_TABLET | Freq: Four times a day (QID) | ORAL | Status: DC
  Administered 2017-01-27 – 2017-01-28 (×6): 600 mg via ORAL
  Filled 2017-01-26 (×7): qty 1

## 2017-01-26 MED ORDER — FENTANYL 2.5 MCG/ML BUPIVACAINE 1/10 % EPIDURAL INFUSION (WH - ANES)
INTRAMUSCULAR | Status: AC
  Filled 2017-01-26: qty 100

## 2017-01-26 MED ORDER — ONDANSETRON HCL 4 MG PO TABS: 4.0000 mg | ORAL_TABLET | ORAL | Status: DC | PRN

## 2017-01-26 MED ORDER — DIPHENHYDRAMINE HCL 50 MG/ML IJ SOLN
12.5000 mg | INTRAMUSCULAR | Status: DC | PRN
  Administered 2017-01-26: 12.5 mg via INTRAVENOUS
  Filled 2017-01-26: qty 1

## 2017-01-26 MED ORDER — OXYTOCIN 40 UNITS IN LACTATED RINGERS INFUSION - SIMPLE MED
2.5000 [IU]/h | INTRAVENOUS | Status: DC
  Administered 2017-01-26: 2.5 [IU]/h via INTRAVENOUS
  Filled 2017-01-26: qty 1000

## 2017-01-26 MED ORDER — OXYTOCIN 40 UNITS IN LACTATED RINGERS INFUSION - SIMPLE MED
1.0000 m[IU]/min | INTRAVENOUS | Status: DC
  Administered 2017-01-26 (×2): 2 m[IU]/min via INTRAVENOUS

## 2017-01-26 MED ORDER — ZOLPIDEM TARTRATE 5 MG PO TABS: 5.0000 mg | ORAL_TABLET | Freq: Every evening | ORAL | Status: DC | PRN

## 2017-01-26 MED ORDER — LACTATED RINGERS IV SOLN
INTRAVENOUS | Status: DC
  Administered 2017-01-26 (×2): via INTRAVENOUS

## 2017-01-26 MED ORDER — EPHEDRINE 5 MG/ML INJ
10.0000 mg | INTRAVENOUS | Status: DC | PRN
  Filled 2017-01-26: qty 2

## 2017-01-26 MED ORDER — DIBUCAINE 1 % RE OINT: 1.0000 "application " | TOPICAL_OINTMENT | RECTAL | Status: DC | PRN

## 2017-01-26 MED ORDER — LACTATED RINGERS IV SOLN
500.0000 mL | Freq: Once | INTRAVENOUS | Status: AC
  Administered 2017-01-26: 500 mL via INTRAVENOUS

## 2017-01-26 MED ORDER — FENTANYL 2.5 MCG/ML BUPIVACAINE 1/10 % EPIDURAL INFUSION (WH - ANES)
14.0000 mL/h | INTRAMUSCULAR | Status: DC | PRN
  Administered 2017-01-26: 14 mL/h via EPIDURAL

## 2017-01-26 MED ORDER — TERBUTALINE SULFATE 1 MG/ML IJ SOLN
0.2500 mg | Freq: Once | INTRAMUSCULAR | Status: DC | PRN
  Filled 2017-01-26: qty 1

## 2017-01-26 MED ORDER — OXYCODONE-ACETAMINOPHEN 5-325 MG PO TABS: 1.0000 | ORAL_TABLET | ORAL | Status: DC | PRN

## 2017-01-26 MED ORDER — OXYCODONE-ACETAMINOPHEN 5-325 MG PO TABS: 2.0000 | ORAL_TABLET | ORAL | Status: DC | PRN

## 2017-01-26 MED ORDER — DIPHENHYDRAMINE HCL 25 MG PO CAPS: 25.0000 mg | ORAL_CAPSULE | Freq: Four times a day (QID) | ORAL | Status: DC | PRN

## 2017-01-26 MED ORDER — FENTANYL CITRATE (PF) 100 MCG/2ML IJ SOLN: 100.0000 ug | INTRAMUSCULAR | Status: DC | PRN

## 2017-01-26 MED ORDER — LACTATED RINGERS IV SOLN: 500.0000 mL | INTRAVENOUS | Status: DC | PRN

## 2017-01-26 MED ORDER — ONDANSETRON HCL 4 MG/2ML IJ SOLN: 4.0000 mg | INTRAMUSCULAR | Status: DC | PRN

## 2017-01-26 MED ORDER — SENNOSIDES-DOCUSATE SODIUM 8.6-50 MG PO TABS
2.0000 | ORAL_TABLET | ORAL | Status: DC
  Administered 2017-01-27 – 2017-01-28 (×2): 2 via ORAL
  Filled 2017-01-26 (×2): qty 2

## 2017-01-26 MED ORDER — ACETAMINOPHEN 325 MG PO TABS: 650.0000 mg | ORAL_TABLET | ORAL | Status: DC | PRN

## 2017-01-26 MED ORDER — WITCH HAZEL-GLYCERIN EX PADS: 1.0000 "application " | MEDICATED_PAD | CUTANEOUS | Status: DC | PRN

## 2017-01-26 MED ORDER — COCONUT OIL OIL
1.0000 "application " | TOPICAL_OIL | Status: DC | PRN
  Administered 2017-01-26: 1 via TOPICAL
  Filled 2017-01-26: qty 120

## 2017-01-26 NOTE — Progress Notes (Signed)
FHT improved off Pitocin, but still not reactive. Will hold off on restarting Pitocin. AROM at 1345, clear fluid.  Expectant management for now.  Raynelle Fanning P. Dejean Tribby, MD OB Fellow

## 2017-01-26 NOTE — Lactation Note (Signed)
This note was copied from a baby's chart. Lactation Consultation Note  Patient Name: Meghan Pruitt [id number] Date: 01/26/2017 Reason for consult: Initial assessment  Initial visit at 3 hours of life. Mom is a P3 who nursed her 1st child for about a year, but did not nurse her 2nd child for very long.   Mom was concerned about the quality of her milk from her R breast. In 2015, she began having an abscess in her R breast. She had a reoccurrence of that abscess 2-3 times during this pregnancy with subsequent biopsies/treatment with antibiotics. The abscess has caused her R nipple to invert. Per Mom, they will remove a milk duct around 3 months postpartum to prevent additional reoccurrences. I reassured Mom that the quality of her milk will not be compromised by the presence of the abscess. Review of medical chart also mentioned an abscess in Mom's L axillae last month.   A hand pump was provided & coconut oil was used for the flange. At the moment, a size 24 flange is appropriate for her, but a size 27 flange may be necessary at a later time. Mom is comfortable with the size 24 flange. Mom to offer L breast and to pump R breast if infant does not take breast.   Lurline Hare [facility] 01/26/2017, 9:15 PM

## 2017-01-26 NOTE — Progress Notes (Signed)
LABOR PROGRESS NOTE  Meghan Pruitt is a 36 y.o. G3P2002 at [redacted]w[redacted]d  admitted for postdates IOL.  Subjective: Pt doing well. Denies any concerns  Objective: BP 121/75   Pulse (!) 103   Temp 98.7 F (37.1 C) (Axillary)   Resp 16   Ht 5\' 3"  (1.6 m)   Wt 223 lb (101.2 kg)   LMP 04/14/2016   SpO2 98%   BMI 39.50 kg/m  or  Vitals:   01/26/17 1501 01/26/17 1531 01/26/17 1601 01/26/17 1631  BP: 107/60 111/64 116/67 121/75  Pulse: 98 88 98 (!) 103  Resp: 16  16   Temp:   98.7 F (37.1 C)   TempSrc:   Axillary   SpO2:      Weight:      Height:        SVE Dilation: 6.5 Effacement (%): 90 Station: -2 Presentation: Vertex Exam by:: Dr. Nira Retort FHT: baseline rate 150, variability (~5 bmp), no acel, no decel Toco: ctx q2-4 min  Assessment / Plan: 36 y.o. G3P2002 at [redacted]w[redacted]d here for postdates IOL  Labor: Pitocin restarted, will titrate slowing 1x1. IUPC placed Fetal Wellbeing: Cat II Pain Control:  Epidural Anticipated MOD:  SVD  Frederik Pear, MD 01/26/2017, 5:08 PM

## 2017-01-26 NOTE — Anesthesia Procedure Notes (Signed)
Epidural Patient location during procedure: OB  Staffing Anesthesiologist: Lewie Loron Performed: anesthesiologist   Preanesthetic Checklist Completed: patient identified, pre-op evaluation, timeout performed, IV checked, risks and benefits discussed and monitors and equipment checked  Epidural Patient position: sitting Prep: site prepped and draped and DuraPrep Patient monitoring: heart rate, continuous pulse ox and blood pressure Approach: midline Location: L3-L4 Injection technique: LOR air and LOR saline  Needle:  Needle type: Tuohy  Needle gauge: 17 G Needle length: 9 cm Needle insertion depth: 8 cm Catheter type: closed end flexible Catheter size: 19 Gauge Catheter at skin depth: 13 cm Test dose: negative  Assessment Sensory level: T8 Events: blood not aspirated, injection not painful, no injection resistance, negative IV test and no paresthesia  Additional Notes Reason for block:procedure for pain

## 2017-01-26 NOTE — Anesthesia Preprocedure Evaluation (Signed)
Anesthesia Evaluation  Patient identified by MRN, date of birth, ID band Patient awake    Reviewed: Allergy & Precautions, NPO status , Patient's Chart, lab work & pertinent test results  Airway Mallampati: III  TM Distance: >3 FB Neck ROM: Full    Dental no notable dental hx.    Pulmonary neg pulmonary ROS, Current Smoker,    Pulmonary exam normal breath sounds clear to auscultation       Cardiovascular negative cardio ROS Normal cardiovascular exam Rhythm:Regular Rate:Normal     Neuro/Psych negative neurological ROS  negative psych ROS   GI/Hepatic negative GI ROS, Neg liver ROS,   Endo/Other  Morbid obesity  Renal/GU negative Renal ROS     Musculoskeletal negative musculoskeletal ROS (+)   Abdominal   Peds  Hematology negative hematology ROS (+)   Anesthesia Other Findings   Reproductive/Obstetrics (+) Pregnancy                             Anesthesia Physical Anesthesia Plan  ASA: III  Anesthesia Plan: Epidural   Post-op Pain Management:    Induction:   PONV Risk Score and Plan:   Airway Management Planned:   Additional Equipment:   Intra-op Plan:   Post-operative Plan:   Informed Consent: I have reviewed the patients History and Physical, chart, labs and discussed the procedure including the risks, benefits and alternatives for the proposed anesthesia with the patient or authorized representative who has indicated his/her understanding and acceptance.   Dental advisory given  Plan Discussed with: CRNA  Anesthesia Plan Comments:         Anesthesia Quick Evaluation

## 2017-01-26 NOTE — Anesthesia Pain Management Evaluation Note (Signed)
  CRNA Pain Management Visit Note  Patient: Meghan Pruitt, 36 y.o., female  "Hello I am a member of the anesthesia team at [facility]. We have an anesthesia team available at all times to provide care throughout the hospital, including epidural management and anesthesia for C-section. I don't know your plan for the delivery whether it a natural birth, water birth, IV sedation, nitrous supplementation, doula or epidural, but we want to meet your pain goals."   1.Was your pain managed to your expectations on prior hospitalizations?   Yes   2.What is your expectation for pain management during this hospitalization?     Epidural  3.How can we help you reach that goal? Epidural in place at time of visit  Record the patient's initial score and the patient's pain goal.   Pain: 0  Pain Goal: 5 [facility] wants you to be able to say your pain was always managed very well.  Rica Records 01/26/2017

## 2017-01-26 NOTE — Progress Notes (Signed)
LABOR PROGRESS NOTE  Meghan Pruitt is a 36 y.o. G3P2002 at [redacted]w[redacted]d  admitted for postdates IOL.  Subjective: Pt doing well. Has epidural now, pain controlled  Objective: BP 109/71   Pulse 96   Temp 98.1 F (36.7 C) (Oral)   Resp 16   Ht 5\' 3"  (1.6 m)   Wt 223 lb (101.2 kg)   LMP 04/14/2016   SpO2 98%   BMI 39.50 kg/m  or  Vitals:   01/26/17 1146 01/26/17 1201 01/26/17 1231 01/26/17 1257  BP:  110/79 109/71   Pulse:  92 96   Resp: 16   16  Temp: 98.1 F (36.7 C)     TempSrc: Oral     SpO2:      Weight:      Height:        Last SVE at 1110 Dilation: 5 Effacement (%): 80 Station: -3 Presentation: Vertex Exam by:: M.Lee, RNC-OB FHT: baseline rate 150, moderate varibility, no acel, early decels, occasional lates Toco: ctx q2-4 min  Assessment / Plan: 36 y.o. G3P2002 at [redacted]w[redacted]d here for postdates IOL  Labor: Will stop Pitocin d/t late decels. IVF being bolused. Plan to restart Pit in about 30 min if decels improved Fetal Wellbeing:  Moderate variability, but late decel, Pitocin stopped as above Pain Control:  Epidufral Anticipated MOD:  SVD  Frederik Pear, MD 01/26/2017, 1:01 PM

## 2017-01-26 NOTE — H&P (Signed)
LABOR AND DELIVERY ADMISSION HISTORY AND PHYSICAL NOTE  Meghan Pruitt is a 36 y.o. female G84P2002 with IUP at [redacted]w[redacted]d by LMP presenting for postdates IOL.   She reports positive fetal movement. She denies leakage of fluid or vaginal bleeding.  Prenatal History/Complications: Clinic [facility]  Prenatal Labs  Dating LMP Blood type: O/Positive/-- (06/18 1621)   Genetic Screen Too Late Antibody:Negative (06/18 1621)  Anatomic [place] 12-06-16 Rubella: 3.85 (06/18 1621)  GTT  A1C: normal RPR: Non Reactive (06/18 1621)   Flu vaccine  HBsAg: Negative (06/18 1621)   TDaP vaccine  11-25-16                                   Rhogam:n/a O+ HIV:   NR   Baby Food Breast?                                      TFT:DDUKGURK (07/10 1125)(For PCN allergy, check sensitivities)  Contraception Nexplanon  Pap:11-25-16: negative  Circumcision N/a female   Pediatrician Dr. Donnie Coffin   Support Person FOB   Prenatal Classes Declined     Late PNC (at 32 weeks)  Past Medical History: Past Medical History:  Diagnosis Date  . Abdominal pain in pregnancy, second trimester 09/14/2016  . Back pain during pregnancy in second trimester 09/14/2016  . Bacterial vaginosis 09/14/2016  . Chronic bronchitis (HCC)   . Hearing loss    left ear    Past Surgical History: Past Surgical History:  Procedure Laterality Date  . BREAST BIOPSY Right 02/25/2014   abscess  . NO PAST SURGERIES      Obstetrical History: OB History    Gravida Para Term Preterm AB Living   3 2 2     2    SAB TAB Ectopic Multiple Live Births                  Social History: Social History   Social History  . Marital status: Single    Spouse name: N/A  . Number of children: N/A  . Years of education: N/A   Social History Main Topics  . Smoking status: Current Some Day Smoker    Packs/day: 0.25    Years: 7.00    Types: Cigarettes  . Smokeless tobacco: Never Used     Comment: only smokes about 1 cigarette a day - causes sickness  . Alcohol use No      Comment: occasionally  . Drug use: No  . Sexual activity: Yes    Birth control/ protection: None   Other Topics Concern  . None   Social History Narrative  . None    Family History: Family History  Problem Relation Age of Onset  . Heart disease Maternal Grandfather   . Heart disease Paternal Grandmother   . Breast cancer Paternal Aunt     Allergies: No Known Allergies  Prescriptions Prior to Admission  Medication Sig Dispense Refill Last Dose  . acetaminophen (TYLENOL) 500 MG tablet Take 1 tablet (500 mg total) by mouth every 6 (six) hours as needed. 30 tablet 0 Past Week at Unknown time  . Cholecalciferol (VITAMIN D) 2000 units tablet Take 1 tablet (2,000 Units total) by mouth daily. 30 tablet 5 01/26/2017 at Unknown time  . Prenat-FeFmCb-DSS-FA-DHA w/o A (CITRANATAL HARMONY) 27-1-260 MG CAPS Take 1 capsule by mouth  daily before breakfast. 90 capsule 4 01/25/2017 at Unknown time  . albuterol (PROVENTIL HFA;VENTOLIN HFA) 108 (90 Base) MCG/ACT inhaler Inhale 1-2 puffs into the lungs every 6 (six) hours as needed for wheezing or shortness of breath. 1 Inhaler 0 More than a month at Unknown time  . docusate sodium (COLACE) 100 MG capsule Take 1 capsule (100 mg total) by mouth 2 (two) times daily. 60 capsule 11 More than a month at Unknown time     Review of Systems   All systems reviewed and negative except as stated in HPI  Physical Exam Blood pressure 125/84, pulse 98, temperature 98.3 F (36.8 C), temperature source Oral, resp. rate 16, height 5\' 3"  (1.6 m), weight 223 lb (101.2 kg), last menstrual period 04/14/2016, unknown if currently breastfeeding. General appearance: alert and cooperative Lungs: normal effort; no respiratory distresss Heart: regular rate  Abdomen: soft, non-tender Extremities: No calf swelling or tenderness Presentation: cephalic Fetal monitoring: Cat I (baseline rate 140, moderate variab, +acel, no decel Uterine activity: occasional ctx Dilation:  3 Effacement (%): 50 Station: -3 Exam by:: Dr. Nira Retort   Prenatal labs: ABO, Rh: O/Positive/-- (06/18 1621) Antibody: Negative (06/18 1621) Rubella: !Error! RPR: Non Reactive (06/18 1621)  HBsAg: Negative (06/18 1621)  HIV:    GBS: Negative (07/10 1125)  A1c: normal Genetic screening:  Too late Anatomy US: normal on 12/06/16  Prenatal Transfer Tool  Maternal Diabetes: No Genetic Screening: too late (late [facility]) Maternal Ultrasounds/Referrals: Normal Fetal Ultrasounds or other Referrals:  None Maternal Substance Abuse:  No Significant Maternal Medications:  None Significant Maternal Lab Results: None  Results for orders placed or performed during the hospital encounter of 01/26/17 (from the past 24 hour(s))  CBC   Collection Time: 01/26/17  8:35 AM  Result Value Ref Range   WBC 9.4 4.0 - 10.5 K/uL   RBC 3.98 3.87 - 5.11 MIL/uL   Hemoglobin 12.1 12.0 - 15.0 g/dL   HCT 16.1 (L) 09.6 - 04.5 %   MCV 90.2 78.0 - 100.0 fL   MCH 30.4 26.0 - 34.0 pg   MCHC 33.7 30.0 - 36.0 g/dL   RDW 40.9 81.1 - 91.4 %   Platelets 183 150 - 400 K/uL  Type and screen   Collection Time: 01/26/17  8:35 AM  Result Value Ref Range   ABO/RH(D) O POS    Antibody Screen NEG    Sample Expiration 01/29/2017   ABO/Rh   Collection Time: 01/26/17  8:35 AM  Result Value Ref Range   ABO/RH(D) O POS     Assessment: Meghan Pruitt is a 36 y.o. G3P2002 at [redacted]w[redacted]d here for postdates IOL  #Labor: Start induction with Pitocin #Pain: Per patient's request #FWB: Cat I #ID:  GBS negative #MOF: breast #MOC:Nexplanon  Meghan Pruitt 01/26/2017, 8:51 AM

## 2017-01-27 LAB — RPR: RPR: NONREACTIVE

## 2017-01-27 MED ORDER — IBUPROFEN 600 MG PO TABS: 600.0000 mg | ORAL_TABLET | Freq: Four times a day (QID) | ORAL | 0 refills | Status: DC

## 2017-01-27 NOTE — Anesthesia Postprocedure Evaluation (Signed)
Anesthesia Post Note  Patient: Meghan Pruitt  Procedure(s) Performed: * No procedures listed *     Patient location during evaluation: Mother Baby Anesthesia Type: Epidural Level of consciousness: awake, awake and alert and oriented Pain management: pain level controlled Vital Signs Assessment: post-procedure vital signs reviewed and stable Respiratory status: spontaneous breathing, nonlabored ventilation and respiratory function stable Cardiovascular status: stable Postop Assessment: no headache, no backache, patient able to bend at knees, no signs of nausea or vomiting and adequate PO intake Anesthetic complications: no    Last Vitals:  Vitals:   01/26/17 1956 01/27/17 0033  BP: 119/70 124/71  Pulse: (!) 102 97  Resp: 18 18  Temp: 36.7 C 36.7 C  SpO2: 99% 99%    Last Pain:  Vitals:   01/27/17 0535  TempSrc:   PainSc: 6    Pain Goal:                 [name]

## 2017-01-27 NOTE — Lactation Note (Signed)
This note was copied from a baby's chart. Lactation Consultation Note Mom can only BF to Lt. Breast. Has large soft pendulum breast w/everted nipple at bottom end of breast. Hand expressed 4 ml colostrum.  Rt. Breast has abscess near nipple that was drained 1 week ago. Can feel large knot above areola. Rt. Nipple has inverted d/t abscess. Edema noted to areola and nipple. Dry scaly skin to Rt. Areola. Hand expressed w/nothing from Rt. Breast. Mom stated she got something previously w/hand expression from Rt. Breast.  Mom will not be able to latch baby at this moment to Rt. Nipple d/t inverted and edema. Nipple at the bottom end of soft pendulum breast which will not hold a NS.  Mom shown how to use DEBP & how to disassemble, clean, & reassemble parts. Mom knows to pump q3h for 15-20 min. Gave mom pump. Nothing collected.   Baby spity. Mom stated baby BF for 30 min. To Lt. Breast. Mom has suction bulb in hand close by.  Mom has 23 yr old that she BF for 1 1/2 yr. Her 35 yr old she only BF a few months. She wants to BF this baby. Mom stated probably breast/formula feeding.   MD will removed abscess duct approx. 3 months postpartum. Mom knows the importance of pumping, hand expression of that breast.  Mom has DEBP at home.   Patient Name: Meghan Pruitt Date: 01/27/2017 Reason for consult: Follow-up assessment;Other (Comment) (breast abcess)   Maternal Data    Feeding Feeding Type: Breast Fed Length of feed: 30 min (off and on)  LATCH Score       Type of Nipple: Everted at rest and after stimulation (inverted Rt. )  Comfort (Breast/Nipple): Soft / non-tender        Interventions Interventions: Breast feeding basics reviewed;Support pillows;Assisted with latch;Position options;Skin to skin;Expressed milk;Breast massage;Coconut oil;Hand express;Hand pump;Breast compression;DEBP  Lactation Tools Discussed/Used Tools: Pump;Coconut oil Breast pump type: Double-Electric  Breast Pump;Manual   Consult Status Consult Status: Follow-up Date: 01/27/17 Follow-up type: In-patient    Meghan Pruitt, Diamond Nickel 01/27/2017, 5:59 AM

## 2017-01-27 NOTE — Discharge Instructions (Signed)

## 2017-01-27 NOTE — Clinical Social Work Maternal (Signed)
  CLINICAL SOCIAL WORK MATERNAL/CHILD NOTE  Patient Details  Name: Meghan Pruitt MRN: [id number] Date of Birth: 1980-06-19  Date:  01/27/2017  Clinical Social Worker Initiating Note:  Laurey Arrow Date/ Time Initiated:  01/27/17/1133     Child's Name:   Meghan Pruitt)   Legal Guardian:  Father (FOB is Stana Bunting 03/17/76)   Need for Interpreter:  None   Date of Referral:  01/26/17     Reason for Referral:  Late or No Prenatal Care    Referral Source:  [facility]   Address:  [address] [place] [address]  Phone number:  [phone]   Household Members:  Self, Minor Children (MOB older children are Nyquarius Dickenson (07/28/03) and Lou Miner (10/05/10).)   Natural Supports (not living in the home):  Immediate Family, Extended Family, Spouse/significant other, Other (Comment) (FOB Family will also provide support. )   Professional Supports: None   Employment: Unemployed   Type of Work:     Education:  Database administrator Resources:  Medicaid   Other Resources:  Physicist, medical , [place] Considerations Which May Impact Care:  Per [employer] Sheet, MOB is [employer].   Strengths:  Ability to meet basic needs , Pediatrician chosen , Home prepared for child    Risk Factors/Current Problems:  None   Cognitive State:  Able to Concentrate , Insightful , Goal Oriented , Linear Thinking , Alert    Mood/Affect:  Calm , Happy , Interested , Comfortable    CSW Assessment: CSW met with MOB to complete an assessment for late [facility].  When CSW arrived, MOB was attaching and bonding with infant as evident by engaging in skin to skin.  MOB was polite, easy to engage, and receptive to meeting with CSW.  MOB was appropriate with infant during assessment and responded appropriately to infant's cues. CSW explained CSW's role and encouraged MOB to ask questions. CSW inquired about MOB's PNC prior and discussed hospital's policy as it  relates to late/limited [facility].  MOB reported MOB initiated [facility] around 12 weeks with [facility] but due to barriers with Medicaid enrollment MOB was unable to seek [facility] consistently.  MOB reported that when MOB's Medicaid was approved MOB initiated care with [facility].  MOB denied the use of illicit substance and expressed that MOB is not concerned about infant's screens. CSW made MOB aware that infant's UDS and CSW are pending and if either are positive without an explanation, CSW will make a report to [place]; MOB understood. MOB communicated MOB is prepared for infant and has all needed items. CSW educated MOB about PPD and informed MOB of possible supports and interventions to decrease PPD.  CSW also encouraged MOB to seek medical attention if needed for increased signs and symptoms for PPD. MOB denied PPD with MOB's oldest children. CSW reviewed safe sleep and SIDS. MOB was knowledgeable.  MOB did not have any questions or concerns at this time, and CSW thanked MOB for allowing CSW to meet with her.  CSW Plan/Description:  Information/Referral to [employer] , No Further Intervention Required/No Barriers to Discharge, Patient/Family Education  (CSW will monitor infant's UDS and CDS and will make a report if warranted. )   Laurey Arrow, MSW, LCSW Clinical Social Work [phone]   Dimple Nanas, LCSW 01/27/2017, 1:38 PM

## 2017-01-27 NOTE — Lactation Note (Signed)
This note was copied from a baby's chart. Lactation Consultation Note  Patient Name: Meghan Pruitt PIRJJ'O Date: 01/27/2017   I spoke w/RN & requested that she clarify with patient that it is OK to feed from breast with an abscess, if desired. RN went in & spoke to patient. Patient told RN that she plans to continue latching onto the L breast & pumping the R breast. RN will make sure that patient knows she does not have to throw out any EBM from the R breast.   Lurline Hare [facility] 01/27/2017, 9:48 PM

## 2017-01-27 NOTE — Plan of Care (Signed)
Problem: Pain Management: Goal: General experience of comfort will improve and pain level will decrease Outcome: Progressing Patient verbalized pain of 3, abdomen, intermittent with breastfeeding. Patient informed of schedule Motrin and encouraged to call if she needs medication. Patient verbalized understanding.

## 2017-01-27 NOTE — Progress Notes (Signed)
UR chart review completed.  

## 2017-01-27 NOTE — Progress Notes (Signed)
Post Partum Day #1 Subjective: no complaints, up ad lib, voiding, tolerating PO and reports normal lochia  Objective: Blood pressure 124/71, pulse 97, temperature 98 F (36.7 C), temperature source Oral, resp. rate 18, height 5\' 3"  (1.6 m), weight 101.2 kg (223 lb), last menstrual period 04/14/2016, SpO2 99 %, unknown if currently breastfeeding.  Physical Exam:  General: alert Lochia: appropriate Uterine Fundus: firm DVT Evaluation: No evidence of DVT seen on physical exam.   Recent Labs  01/26/17 0835  HGB 12.1  HCT 35.9*    Assessment/Plan: Discharge home tomorrow Breast and bottle Nexplanon   LOS: 1 day   Allie Bossier 01/27/2017, 6:53 AM

## 2017-01-27 NOTE — Plan of Care (Signed)
Problem: Role Relationship: Goal: Ability to demonstrate positive interaction with newborn will improve Outcome: Completed/Met Date Met: 01/27/17 Patient is bonding well with newborn. Skin to skin with newborn encouraged.    

## 2017-01-28 NOTE — Lactation Note (Signed)
This note was copied from a baby's chart. Lactation Consultation Note  Patient Name: Girl Litzi Keziah [id number] Date: 01/28/2017 Reason for consult: Follow-up assessment Baby at 39 hr of life and dyad set for d/c today. Mom is only latching baby to the L breast. She reports the RN has worked with her this am on getting a deeper latch. She denies breast or nipple pain. She has not and is not interested in latching baby to the R breast. She stated her nipple is inverted and she is "worried about that abscess". She is only comfortable with pumping the R breast. She plans to continue latching baby, pumping, and offering formula as needed. Discussed baby behavior, feeding frequency, pumping, supplementing volume increases, baby belly size, voids, wt loss, breast changes, and nipple care. She has a pump for home use. She has bf experience. She is aware of lactation services and support group. She will call as needed.   Maternal Data    Feeding Feeding Type: Bottle Fed - Formula Nipple Type: Slow - flow  LATCH Score                   Interventions    Lactation Tools Discussed/Used     Consult Status Consult Status: Complete Follow-up type: Call as needed    Rulon Eisenmenger 01/28/2017, 9:33 AM

## 2017-01-28 NOTE — Discharge Summary (Signed)
OB Discharge Summary  Patient Name: Meghan Pruitt DOB: 16-Sep-1980 MRN: [id number]  Date of admission: 01/26/2017 Delivering MD: Frederik Pear   Date of discharge: 01/28/2017  Admitting diagnosis: Induction, 41w  Intrauterine pregnancy: [redacted]w[redacted]d     Secondary diagnosis:Principal Problem:   SVD (spontaneous vaginal delivery) Active Problems:   Post term pregnancy at [redacted] weeks gestation  Additional problems: morbid obesity     Discharge diagnosis: Term Pregnancy Delivered                                                                     Augmentation: AROM and Pitocin  Complications: None  Hospital course:  Induction of Labor With Vaginal Delivery   36 y.o. yo G3P3003 at [redacted]w[redacted]d was admitted to the hospital 01/26/2017 for induction of labor.  Indication for induction: Postdates.  Patient had an uncomplicated labor course as follows: Membrane Rupture Time/Date: 1:47 PM ,01/26/2017   Intrapartum Procedures: Episiotomy: None [1]                                         Lacerations:  None [1]  Patient had delivery of a Viable infant.  Information for the patient's newborn:  Naeemah, Jasmer [[id number]]  Delivery Method: Vaginal, Spontaneous Delivery (Filed from Delivery Summary)   01/26/2017  Details of delivery can be found in separate delivery note.  Patient had a routine postpartum course. Patient is discharged home 01/28/17.  Physical exam  Vitals:   01/27/17 0700 01/27/17 0807 01/27/17 2106 01/28/17 0542  BP:  130/76 130/83 129/81  Pulse: 90 81 79 91  Resp: 19 19 18    Temp: 97.7 F (36.5 C) 97.7 F (36.5 C) 98.1 F (36.7 C) 98.4 F (36.9 C)  TempSrc: Oral Oral Oral Oral  SpO2:   100% 98%  Weight:      Height:       General: alert Lochia: appropriate Uterine Fundus: firm and NT at U Incision: N/A DVT Evaluation: No evidence of DVT seen on physical exam. Labs: Lab Results  Component Value Date   WBC 9.4 01/26/2017   HGB 12.1 01/26/2017   HCT 35.9 (L)  01/26/2017   MCV 90.2 01/26/2017   PLT 183 01/26/2017   CMP Latest Ref Rng & Units 09/14/2016  Glucose 65 - 99 mg/dL 782(N)  BUN 6 - 20 mg/dL 6  Creatinine 5.62 - 1.30 mg/dL 8.65  Sodium 784 - 696 mmol/L 135  Potassium 3.5 - 5.1 mmol/L 3.5  Chloride 101 - 111 mmol/L 105  CO2 22 - 32 mmol/L 24  Calcium 8.9 - 10.3 mg/dL 8.9  Total Protein 6.5 - 8.1 g/dL 6.5  Total Bilirubin 0.3 - 1.2 mg/dL 0.5  Alkaline Phos 38 - 126 U/L 41  AST 15 - 41 U/L 12(L)  ALT 14 - 54 U/L 9(L)    Discharge instruction: per After Visit Summary and "Baby and Me Booklet".  After Visit Meds:  Allergies as of 01/28/2017   No Known Allergies     Medication List    TAKE these medications   acetaminophen 500 MG tablet Commonly known as:  TYLENOL Take 1  tablet (500 mg total) by mouth every 6 (six) hours as needed.   albuterol 108 (90 Base) MCG/ACT inhaler Commonly known as:  PROVENTIL HFA;VENTOLIN HFA Inhale 1-2 puffs into the lungs every 6 (six) hours as needed for wheezing or shortness of breath.   calcium carbonate 500 MG chewable tablet Commonly known as:  TUMS - dosed in mg elemental calcium Chew 2-3 tablets by mouth 2 (two) times daily as needed for indigestion or heartburn.   CITRANATAL HARMONY 27-1-260 MG Caps Take 1 capsule by mouth daily before breakfast.   docusate sodium 100 MG capsule Commonly known as:  COLACE Take 1 capsule (100 mg total) by mouth 2 (two) times daily.   ibuprofen 600 MG tablet Commonly known as:  ADVIL,MOTRIN Take 1 tablet (600 mg total) by mouth every 6 (six) hours.   Vitamin D 2000 units tablet Take 1 tablet (2,000 Units total) by mouth daily.       Diet: routine diet  Activity: Advance as tolerated. Pelvic rest for 6 weeks.   Outpatient follow up:4 weeks Follow up Appt:Future Appointments Date Time Provider Department Center  02/24/2017 8:45 AM Brock Bad, MD [facility] None   Follow up visit: No Follow-up on file.  Postpartum contraception: plans  Nexplanon at postpartum visit  Newborn Data: Live born female  Birth Weight: 6 lb 11.4 oz (3045 g) APGAR: 9, 9  Baby Feeding: Bottle and Breast Disposition:home with mother   01/28/2017 Allie Bossier, MD

## 2017-02-24 ENCOUNTER — Ambulatory Visit (INDEPENDENT_AMBULATORY_CARE_PROVIDER_SITE_OTHER): Payer: Medicaid Other | Admitting: Obstetrics

## 2017-02-24 ENCOUNTER — Encounter: Payer: Self-pay | Admitting: Obstetrics

## 2017-02-24 DIAGNOSIS — Z3009 Encounter for other general counseling and advice on contraception: Secondary | ICD-10-CM

## 2017-02-24 DIAGNOSIS — Z1389 Encounter for screening for other disorder: Secondary | ICD-10-CM

## 2017-02-24 NOTE — Progress Notes (Signed)
Patient desires nexplanon for [facility], and pt reports occasionally having pelvic cramping but no bleeding.  Marland KitchenArlie Pruitt Partum Exam  Meghan Pruitt is a 36 y.o. G67P3003 female who presents for a postpartum visit. She is 4 weeks postpartum following a spontaneous vaginal delivery. I have fully reviewed the prenatal and intrapartum course. The delivery was at 41.0 gestational weeks.  Anesthesia: epidural. Postpartum course has been good. Baby's course has been good. Baby is feeding by both breast and bottle - Similac Advance. Bleeding no bleeding. Bowel function is normal. Bladder function is normal. Patient is not sexually active. Contraception method is abstinence. Postpartum depression screening:neg  The following portions of the patient's history were reviewed and updated as appropriate: allergies, current medications, past family history, past medical history, past social history, past surgical history and problem list.  Review of Systems A comprehensive review of systems was negative.    Objective:  unknown if currently breastfeeding.  General:  alert and no distress   Breasts:  inspection negative, no nipple discharge or bleeding, no masses or nodularity palpable  Lungs: clear to auscultation bilaterally  Heart:  regular rate and rhythm, S1, S2 normal, no murmur, click, rub or gallop  Abdomen: soft, non-tender; bowel sounds normal; no masses,  no organomegaly   Assessment:     1. Postpartum care following vaginal delivery - doing well.  2. Encounter for other general counseling and advice on contraception - desires Nexplanon  Plan:   1. Contraception: Nexplanon 2. Nexplanon Rx 3. Follow up in: 2 weeks for Nexplanon Removal and Reinsertion

## 2017-03-13 ENCOUNTER — Encounter: Payer: Self-pay | Admitting: Obstetrics

## 2017-03-13 ENCOUNTER — Encounter: Payer: Self-pay | Admitting: *Deleted

## 2017-03-13 ENCOUNTER — Ambulatory Visit (INDEPENDENT_AMBULATORY_CARE_PROVIDER_SITE_OTHER): Payer: Medicaid Other | Admitting: Obstetrics

## 2017-03-13 VITALS — BP 137/92 | HR 87 | Wt 221.0 lb

## 2017-03-13 DIAGNOSIS — Z01812 Encounter for preprocedural laboratory examination: Secondary | ICD-10-CM

## 2017-03-13 DIAGNOSIS — Z3049 Encounter for surveillance of other contraceptives: Secondary | ICD-10-CM | POA: Diagnosis not present

## 2017-03-13 DIAGNOSIS — Z3202 Encounter for pregnancy test, result negative: Secondary | ICD-10-CM | POA: Diagnosis not present

## 2017-03-13 DIAGNOSIS — Z3046 Encounter for surveillance of implantable subdermal contraceptive: Secondary | ICD-10-CM

## 2017-03-13 LAB — POCT URINE PREGNANCY: PREG TEST UR: NEGATIVE

## 2017-03-13 MED ORDER — ETONOGESTREL 68 MG ~~LOC~~ IMPL: 68.0000 mg | DRUG_IMPLANT | Freq: Once | SUBCUTANEOUS | Status: DC

## 2017-03-13 NOTE — Progress Notes (Signed)
Nexplanon Procedure Note    PROCEDURE: Nexplanon removal and placement Performing Provider: Brock Bad MD  Patient education prior to procedure, explained risk, benefits of Nexplanon, reviewed alternative options. Patient reported understanding. Gave consent to continue with procedure.  Patient had Nexplanon inserted in ~2014. Desires removal today w/ reinsertion  PROCEDURE:  Pregnancy Text :  not indicated Site (check):      right  arm         Sterile Preparation:  Betadinex3 Lot # [id number] Expiration Date 06/2019    The patient's left arm was palpated and the implant device located. The area was prepped with Betadinex3. The distal end of the device was palpated and 1.5 cc of 1% lidocaine without epinephrine was injected. A 1.5 mm incision was made. Any fibrotic tissue was carefully dissected away using blunt and/or sharp dissection. The device was removed in an intact manner.   Insertion site was the same as the removal site. Nexplanon  was inserted subcutaneously.Needle was removed from the insertion site. Nexplanon capsule was palpated by provider and patient to assure satisfactory placement. Dressing applied.  Followup: The patient tolerated the procedure well without complications.  Standard post-procedure care is explained and return precautions are given.  Brock Bad MD

## 2017-03-27 ENCOUNTER — Encounter: Payer: Self-pay | Admitting: Obstetrics

## 2017-03-27 ENCOUNTER — Ambulatory Visit (INDEPENDENT_AMBULATORY_CARE_PROVIDER_SITE_OTHER): Payer: Medicaid Other | Admitting: Obstetrics

## 2017-03-27 VITALS — BP 131/85 | HR 84 | Temp 98.5°F | Wt 227.0 lb

## 2017-03-27 DIAGNOSIS — Z3046 Encounter for surveillance of implantable subdermal contraceptive: Secondary | ICD-10-CM

## 2017-03-27 DIAGNOSIS — L02413 Cutaneous abscess of right upper limb: Secondary | ICD-10-CM

## 2017-03-27 MED ORDER — AMOXICILLIN-POT CLAVULANATE 875-125 MG PO TABS: 1.0000 | ORAL_TABLET | Freq: Two times a day (BID) | ORAL | 0 refills | Status: DC

## 2017-03-27 MED ORDER — SULFAMETHOXAZOLE-TRIMETHOPRIM 800-160 MG PO TABS: 1.0000 | ORAL_TABLET | Freq: Two times a day (BID) | ORAL | 0 refills | Status: DC

## 2017-03-27 NOTE — Progress Notes (Signed)
Patient ID: Meghan Pruitt, female   DOB: 15-Mar-1981, 36 y.o.   MRN: [id number]  Chief Complaint  Patient presents with  . Follow-up    Nexplanon insertion site pain and swelling.    HPI Meghan Pruitt is a 36 y.o. female.  Nexplanon insertion site pain and swelling. HPI  Past Medical History:  Diagnosis Date  . Abdominal pain in pregnancy, second trimester 09/14/2016  . Back pain during pregnancy in second trimester 09/14/2016  . Bacterial vaginosis 09/14/2016  . Chronic bronchitis (HCC)   . Hearing loss    left ear    Past Surgical History:  Procedure Laterality Date  . BREAST BIOPSY Right 02/25/2014   abscess  . NO PAST SURGERIES      Family History  Problem Relation Age of Onset  . Heart disease Maternal Grandfather   . Heart disease Paternal Grandmother   . Breast cancer Paternal Aunt     Social History Social History  Substance Use Topics  . Smoking status: Current Some Day Smoker    Packs/day: 0.25    Years: 7.00    Types: Cigarettes  . Smokeless tobacco: Never Used     Comment: only smokes about 1 cigarette a day - causes sickness  . Alcohol use No     Comment: occasionally    No Known Allergies  Current Outpatient Prescriptions  Medication Sig Dispense Refill  . acetaminophen (TYLENOL) 500 MG tablet Take 1 tablet (500 mg total) by mouth every 6 (six) hours as needed. 30 tablet 0  . albuterol (PROVENTIL HFA;VENTOLIN HFA) 108 (90 Base) MCG/ACT inhaler Inhale 1-2 puffs into the lungs every 6 (six) hours as needed for wheezing or shortness of breath. 1 Inhaler 0  . Prenat-FeFmCb-DSS-FA-DHA w/o A (CITRANATAL HARMONY) 27-1-260 MG CAPS Take 1 capsule by mouth daily before breakfast. 90 capsule 4  . amoxicillin-clavulanate (AUGMENTIN) 875-125 MG tablet Take 1 tablet by mouth 2 (two) times daily. 28 tablet 0  . sulfamethoxazole-trimethoprim (BACTRIM DS,SEPTRA DS) 800-160 MG tablet Take 1 tablet by mouth 2 (two) times daily. 28 tablet 0   Current  Facility-Administered Medications  Medication Dose Route Frequency Provider Last Rate Last Dose  . etonogestrel (NEXPLANON) implant 68 mg  68 mg Subdermal Once Brock Bad, MD        Review of Systems Review of Systems Constitutional: negative for fatigue and weight loss Respiratory: negative for cough and wheezing Cardiovascular: negative for chest pain, fatigue and palpitations Gastrointestinal: negative for abdominal pain and change in bowel habits Genitourinary:negative Integument/breast: negative for nipple discharge Musculoskeletal:positive for pain and swelling of Nexplanon insertion site Neurological: negative for gait problems and tremors Behavioral/Psych: negative for abusive relationship, depression Endocrine: negative for temperature intolerance      Blood pressure 131/85, pulse 84, temperature 98.5 F (36.9 C), temperature source Oral, weight 227 lb (103 kg), last menstrual period 02/26/2017, currently breastfeeding.  Physical Exam Physical Exam           General:  Alert and no distress          Right Upper Extremity:  Tender, swollen Nexplanon insertion site  50% of 15 min visit spent on counseling and coordination of care.    Data Reviewed Labs  Assessment     1. Encounter for surveillance of Nexplanon subdermal contraceptive   2. Abscess of arm, right, at Nexplanon insertion site Rx: - amoxicillin-clavulanate (AUGMENTIN) 875-125 MG tablet; Take 1 tablet by mouth 2 (two) times daily.  Dispense: 28 tablet; Refill: 0 -  sulfamethoxazole-trimethoprim (BACTRIM DS,SEPTRA DS) 800-160 MG tablet; Take 1 tablet by mouth 2 (two) times daily.  Dispense: 28 tablet; Refill: 0    Plan    Follow up in 2 weeks  No orders of the defined types were placed in this encounter.  Meds ordered this encounter  Medications  . amoxicillin-clavulanate (AUGMENTIN) 875-125 MG tablet    Sig: Take 1 tablet by mouth 2 (two) times daily.    Dispense:  28 tablet    Refill:  0   . sulfamethoxazole-trimethoprim (BACTRIM DS,SEPTRA DS) 800-160 MG tablet    Sig: Take 1 tablet by mouth 2 (two) times daily.    Dispense:  28 tablet    Refill:  0

## 2017-03-31 ENCOUNTER — Encounter: Payer: Self-pay | Admitting: Obstetrics

## 2017-03-31 ENCOUNTER — Ambulatory Visit (INDEPENDENT_AMBULATORY_CARE_PROVIDER_SITE_OTHER): Payer: Medicaid Other | Admitting: Obstetrics

## 2017-03-31 ENCOUNTER — Ambulatory Visit: Payer: Medicaid Other | Admitting: Obstetrics

## 2017-03-31 VITALS — BP 130/85 | HR 91 | Wt 230.0 lb

## 2017-03-31 DIAGNOSIS — L02413 Cutaneous abscess of right upper limb: Secondary | ICD-10-CM

## 2017-03-31 DIAGNOSIS — O0993 Supervision of high risk pregnancy, unspecified, third trimester: Secondary | ICD-10-CM

## 2017-03-31 MED ORDER — IBUPROFEN 800 MG PO TABS: 800.0000 mg | ORAL_TABLET | Freq: Three times a day (TID) | ORAL | 5 refills | Status: DC | PRN

## 2017-03-31 MED ORDER — OXYCODONE-ACETAMINOPHEN 10-325 MG PO TABS: 1.0000 | ORAL_TABLET | Freq: Four times a day (QID) | ORAL | 0 refills | Status: DC | PRN

## 2017-03-31 NOTE — Progress Notes (Signed)
Patient ID: Meghan Pruitt, female   DOB: 01-19-1981, 36 y.o.   MRN: [id number]  Chief Complaint  Patient presents with  . Follow-up    HPI Meghan Pruitt is a 36 y.o. female.  Less swelling, but still painful at insertion site, but less. HPI  Past Medical History:  Diagnosis Date  . Abdominal pain in pregnancy, second trimester 09/14/2016  . Back pain during pregnancy in second trimester 09/14/2016  . Bacterial vaginosis 09/14/2016  . Chronic bronchitis (HCC)   . Hearing loss    left ear    Past Surgical History:  Procedure Laterality Date  . BREAST BIOPSY Right 02/25/2014   abscess  . NO PAST SURGERIES      Family History  Problem Relation Age of Onset  . Heart disease Maternal Grandfather   . Heart disease Paternal Grandmother   . Breast cancer Paternal Aunt     Social History Social History  Substance Use Topics  . Smoking status: Current Some Day Smoker    Packs/day: 0.25    Years: 7.00    Types: Cigarettes  . Smokeless tobacco: Never Used     Comment: only smokes about 1 cigarette a day - causes sickness  . Alcohol use No     Comment: occasionally    No Known Allergies  Current Outpatient Prescriptions  Medication Sig Dispense Refill  . amoxicillin-clavulanate (AUGMENTIN) 875-125 MG tablet Take 1 tablet by mouth 2 (two) times daily. 28 tablet 0  . acetaminophen (TYLENOL) 500 MG tablet Take 1 tablet (500 mg total) by mouth every 6 (six) hours as needed. 30 tablet 0  . albuterol (PROVENTIL HFA;VENTOLIN HFA) 108 (90 Base) MCG/ACT inhaler Inhale 1-2 puffs into the lungs every 6 (six) hours as needed for wheezing or shortness of breath. 1 Inhaler 0  . ibuprofen (ADVIL,MOTRIN) 800 MG tablet Take 1 tablet (800 mg total) by mouth every 8 (eight) hours as needed. 30 tablet 5  . oxyCODONE-acetaminophen (PERCOCET) 10-325 MG tablet Take 1 tablet by mouth every 6 (six) hours as needed for pain. 20 tablet 0  . Prenat-FeFmCb-DSS-FA-DHA w/o A (CITRANATAL HARMONY) 27-1-260 MG  CAPS Take 1 capsule by mouth daily before breakfast. 90 capsule 4  . sulfamethoxazole-trimethoprim (BACTRIM DS,SEPTRA DS) 800-160 MG tablet Take 1 tablet by mouth 2 (two) times daily. 28 tablet 0   Current Facility-Administered Medications  Medication Dose Route Frequency Provider Last Rate Last Dose  . etonogestrel (NEXPLANON) implant 68 mg  68 mg Subdermal Once Brock Bad, MD        Review of Systems Review of Systems Constitutional: negative for fatigue and weight loss Respiratory: negative for cough and wheezing Cardiovascular: negative for chest pain, fatigue and palpitations Gastrointestinal: negative for abdominal pain and change in bowel habits Genitourinary:negative Integument/breast: positive for pain at Nexplanon insertion site Musculoskeletal:negative for myalgias Neurological: negative for gait problems and tremors Behavioral/Psych: negative for abusive relationship, depression Endocrine: negative for temperature intolerance      Blood pressure 130/85, pulse 91, weight 230 lb (104.3 kg), currently breastfeeding.  Physical Exam Physical Exam           General:  Alert and no distress          Right upper extremity:  Nexplanon insertion site less indurated and tender  50% of 15 min visit spent on counseling and coordination of care.    Data Reviewed Temp, VS's  Assessment     1. Abscess of arm, right - resolving appropriately after starting antibiotics  3 days ago Rx: - Wound culture - ibuprofen (ADVIL,MOTRIN) 800 MG tablet; Take 1 tablet (800 mg total) by mouth every 8 (eight) hours as needed.  Dispense: 30 tablet; Refill: 5 - oxyCODONE-acetaminophen (PERCOCET) 10-325 MG tablet; Take 1 tablet by mouth every 6 (six) hours as needed for pain.  Dispense: 20 tablet; Refill: 0     Plan    Follow up in 1 week  Orders Placed This Encounter  Procedures  . Wound culture    Order Specific Question:   Source    Answer:   Right Arm, Nexplanon insertion site    Meds ordered this encounter  Medications  . DISCONTD: oxyCODONE-acetaminophen (PERCOCET) 10-325 MG tablet    Sig: Take 1 tablet by mouth every 6 (six) hours as needed for pain.    Dispense:  20 tablet    Refill:  0  . ibuprofen (ADVIL,MOTRIN) 800 MG tablet    Sig: Take 1 tablet (800 mg total) by mouth every 8 (eight) hours as needed.    Dispense:  30 tablet    Refill:  5  . oxyCODONE-acetaminophen (PERCOCET) 10-325 MG tablet    Sig: Take 1 tablet by mouth every 6 (six) hours as needed for pain.    Dispense:  20 tablet    Refill:  0

## 2017-04-03 ENCOUNTER — Ambulatory Visit (INDEPENDENT_AMBULATORY_CARE_PROVIDER_SITE_OTHER): Payer: Medicaid Other | Admitting: Obstetrics

## 2017-04-03 ENCOUNTER — Telehealth: Payer: Self-pay

## 2017-04-03 DIAGNOSIS — Z3046 Encounter for surveillance of implantable subdermal contraceptive: Secondary | ICD-10-CM

## 2017-04-03 DIAGNOSIS — B373 Candidiasis of vulva and vagina: Secondary | ICD-10-CM

## 2017-04-03 DIAGNOSIS — L02413 Cutaneous abscess of right upper limb: Secondary | ICD-10-CM | POA: Diagnosis not present

## 2017-04-03 DIAGNOSIS — B3731 Acute candidiasis of vulva and vagina: Secondary | ICD-10-CM

## 2017-04-03 DIAGNOSIS — Z30015 Encounter for initial prescription of vaginal ring hormonal contraceptive: Secondary | ICD-10-CM | POA: Diagnosis not present

## 2017-04-03 DIAGNOSIS — Z3009 Encounter for other general counseling and advice on contraception: Secondary | ICD-10-CM | POA: Diagnosis not present

## 2017-04-03 LAB — WOUND CULTURE: ORGANISM ID, BACTERIA: NONE SEEN

## 2017-04-03 MED ORDER — ETONOGESTREL-ETHINYL ESTRADIOL 0.12-0.015 MG/24HR VA RING: VAGINAL_RING | VAGINAL | 12 refills | Status: DC

## 2017-04-03 MED ORDER — FLUCONAZOLE 150 MG PO TABS: 150.0000 mg | ORAL_TABLET | Freq: Once | ORAL | 0 refills | Status: AC

## 2017-04-03 NOTE — Progress Notes (Signed)
Patient ID: Meghan Pruitt, female   DOB: 01/22/1981, 36 y.o.   MRN: [id number]  No chief complaint on file.   HPI Meghan Pruitt is a 36 y.o. female.  History of infected Nexplanon inserted site.  Started on antibiotics.  Presents for follow up.  She states that Nexplanon was protruding from the insertion site 3 days ago, so she removed it, intact.  She brought the rod with her for examination.  She has no complaints.  Denies pain or drainage from the insertion site.  HPI  Past Medical History:  Diagnosis Date  . Abdominal pain in pregnancy, second trimester 09/14/2016  . Back pain during pregnancy in second trimester 09/14/2016  . Bacterial vaginosis 09/14/2016  . Chronic bronchitis (HCC)   . Hearing loss    left ear    Past Surgical History:  Procedure Laterality Date  . BREAST BIOPSY Right 02/25/2014   abscess  . NO PAST SURGERIES      Family History  Problem Relation Age of Onset  . Heart disease Maternal Grandfather   . Heart disease Paternal Grandmother   . Breast cancer Paternal Aunt     Social History Social History  Substance Use Topics  . Smoking status: Current Some Day Smoker    Packs/day: 0.25    Years: 7.00    Types: Cigarettes  . Smokeless tobacco: Never Used     Comment: only smokes about 1 cigarette a day - causes sickness  . Alcohol use No     Comment: occasionally    No Known Allergies  Current Outpatient Prescriptions  Medication Sig Dispense Refill  . albuterol (PROVENTIL HFA;VENTOLIN HFA) 108 (90 Base) MCG/ACT inhaler Inhale 1-2 puffs into the lungs every 6 (six) hours as needed for wheezing or shortness of breath. 1 Inhaler 0  . amoxicillin-clavulanate (AUGMENTIN) 875-125 MG tablet Take 1 tablet by mouth 2 (two) times daily. 28 tablet 0  . etonogestrel-ethinyl estradiol (NUVARING) 0.12-0.015 MG/24HR vaginal ring Insert vaginally and leave in place for 3 consecutive weeks, then remove for 1 week. 1 each 12  . fluconazole (DIFLUCAN) 150 MG tablet  Take 1 tablet (150 mg total) by mouth once. 1 tablet 0  . ibuprofen (ADVIL,MOTRIN) 800 MG tablet Take 1 tablet (800 mg total) by mouth every 8 (eight) hours as needed. 30 tablet 5  . oxyCODONE-acetaminophen (PERCOCET) 10-325 MG tablet Take 1 tablet by mouth every 6 (six) hours as needed for pain. 20 tablet 0  . Prenat-FeFmCb-DSS-FA-DHA w/o A (CITRANATAL HARMONY) 27-1-260 MG CAPS Take 1 capsule by mouth daily before breakfast. 90 capsule 4  . sulfamethoxazole-trimethoprim (BACTRIM DS,SEPTRA DS) 800-160 MG tablet Take 1 tablet by mouth 2 (two) times daily. 28 tablet 0   Current Facility-Administered Medications  Medication Dose Route Frequency Provider Last Rate Last Dose  . etonogestrel (NEXPLANON) implant 68 mg  68 mg Subdermal Once Brock BadHarper, Charles A, MD        Review of Systems Review of Systems Constitutional: negative for fatigue and weight loss Respiratory: negative for cough and wheezing Cardiovascular: negative for chest pain, fatigue and palpitations Gastrointestinal: negative for abdominal pain and change in bowel habits Genitourinary:negative Integument/breast: negative for nipple discharge Musculoskeletal:negative for myalgias Neurological: negative for gait problems and tremors Behavioral/Psych: negative for abusive relationship, depression Endocrine: negative for temperature intolerance      currently breastfeeding.  Physical Exam Physical Exam             General:  Alert and no distress  Right Upper Extremity:  Nexplanon removal / insertion site clean, dry and intact.  Non tender to palpation.  No rod felt.           50% of 15 min visit spent on counseling and coordination of care.    Data Reviewed Nexplanon rod brought in by patient was intact.  Assessment     1. Encounter for surveillance of Nexplanon subdermal contraceptive   2. Abscess of arm, right, at Nexplanon removal / insertion site - resolved, area healing well  3. Encounter for other general  counseling and advice on contraception - wants to start NuvaRing  4. Encounter for initial prescription of vaginal ring hormonal contraceptive Rx: - etonogestrel-ethinyl estradiol (NUVARING) 0.12-0.015 MG/24HR vaginal ring; Insert vaginally and leave in place for 3 consecutive weeks, then remove for 1 week.  Dispense: 1 each; Refill: 12  5. Candida vaginitis Rx: - fluconazole (DIFLUCAN) 150 MG tablet; Take 1 tablet (150 mg total) by mouth once.  Dispense: 1 tablet; Refill: 0    Plan    Follow up for Annual / Pap  No orders of the defined types were placed in this encounter.  Meds ordered this encounter  Medications  . fluconazole (DIFLUCAN) 150 MG tablet    Sig: Take 1 tablet (150 mg total) by mouth once.    Dispense:  1 tablet    Refill:  0  . etonogestrel-ethinyl estradiol (NUVARING) 0.12-0.015 MG/24HR vaginal ring    Sig: Insert vaginally and leave in place for 3 consecutive weeks, then remove for 1 week.    Dispense:  1 each    Refill:  12    Charles A. Clearance Coots MD

## 2017-04-04 NOTE — Telephone Encounter (Signed)
Pt presented to office 10/25

## 2017-04-10 ENCOUNTER — Ambulatory Visit: Payer: Medicaid Other | Admitting: Obstetrics & Gynecology

## 2017-04-17 ENCOUNTER — Ambulatory Visit: Payer: Medicaid Other | Admitting: Obstetrics

## 2017-06-06 ENCOUNTER — Encounter (HOSPITAL_COMMUNITY): Payer: Self-pay | Admitting: Emergency Medicine

## 2017-06-06 ENCOUNTER — Other Ambulatory Visit: Payer: Self-pay

## 2017-06-06 ENCOUNTER — Emergency Department (HOSPITAL_COMMUNITY)
Admission: EM | Admit: 2017-06-06 | Discharge: 2017-06-06 | Disposition: A | Discharge status: Home / Self Care | Payer: Medicaid Other | Attending: Emergency Medicine | Admitting: Emergency Medicine

## 2017-06-06 DIAGNOSIS — J029 Acute pharyngitis, unspecified: Secondary | ICD-10-CM | POA: Diagnosis present

## 2017-06-06 DIAGNOSIS — J020 Streptococcal pharyngitis: Secondary | ICD-10-CM | POA: Diagnosis not present

## 2017-06-06 DIAGNOSIS — F17210 Nicotine dependence, cigarettes, uncomplicated: Secondary | ICD-10-CM | POA: Diagnosis not present

## 2017-06-06 LAB — CBC WITH DIFFERENTIAL/PLATELET
Basophils Absolute: 0 10*3/uL (ref 0.0–0.1)
Basophils Relative: 0 %
Eosinophils Absolute: 0 10*3/uL (ref 0.0–0.7)
Eosinophils Relative: 0 %
HEMATOCRIT: 40 % (ref 36.0–46.0)
HEMOGLOBIN: 13.5 g/dL (ref 12.0–15.0)
LYMPHS ABS: 1.8 10*3/uL (ref 0.7–4.0)
LYMPHS PCT: 8 %
MCH: 31.2 pg (ref 26.0–34.0)
MCHC: 33.8 g/dL (ref 30.0–36.0)
MCV: 92.4 fL (ref 78.0–100.0)
MONOS PCT: 7 %
Monocytes Absolute: 1.4 10*3/uL — ABNORMAL HIGH (ref 0.1–1.0)
NEUTROS ABS: 17.8 10*3/uL — AB (ref 1.7–7.7)
NEUTROS PCT: 85 %
Platelets: 206 10*3/uL (ref 150–400)
RBC: 4.33 MIL/uL (ref 3.87–5.11)
RDW: 13 % (ref 11.5–15.5)
WBC: 21 10*3/uL — AB (ref 4.0–10.5)

## 2017-06-06 LAB — RAPID STREP SCREEN (MED CTR MEBANE ONLY): STREPTOCOCCUS, GROUP A SCREEN (DIRECT): POSITIVE — AB

## 2017-06-06 LAB — I-STAT BETA HCG BLOOD, ED (MC, WL, AP ONLY): I-stat hCG, quantitative: <5 m[IU]/mL (ref <5)

## 2017-06-06 LAB — MONONUCLEOSIS SCREEN: Mono Screen: NEGATIVE

## 2017-06-06 MED ORDER — MORPHINE SULFATE (PF) 4 MG/ML IV SOLN
4.0000 mg | Freq: Once | INTRAVENOUS | Status: AC
  Administered 2017-06-06: 4 mg via INTRAVENOUS
  Filled 2017-06-06: qty 1

## 2017-06-06 MED ORDER — SODIUM CHLORIDE 0.9 % IV BOLUS (SEPSIS)
2000.0000 mL | Freq: Once | INTRAVENOUS | Status: AC
  Administered 2017-06-06: 2000 mL via INTRAVENOUS

## 2017-06-06 MED ORDER — PENICILLIN G BENZATHINE 1200000 UNIT/2ML IM SUSP
1.2000 10*6.[IU] | Freq: Once | INTRAMUSCULAR | Status: AC
  Administered 2017-06-06: 1.2 10*6.[IU] via INTRAMUSCULAR
  Filled 2017-06-06: qty 2

## 2017-06-06 MED ORDER — HYDROCODONE-ACETAMINOPHEN 5-325 MG PO TABS: 1.0000 | ORAL_TABLET | Freq: Four times a day (QID) | ORAL | 0 refills | Status: DC | PRN

## 2017-06-06 MED ORDER — ACETAMINOPHEN 500 MG PO TABS
1000.0000 mg | ORAL_TABLET | Freq: Once | ORAL | Status: AC
  Administered 2017-06-06: 1000 mg via ORAL
  Filled 2017-06-06: qty 2

## 2017-06-06 MED ORDER — HYDROCODONE-ACETAMINOPHEN 5-325 MG PO TABS
1.0000 | ORAL_TABLET | Freq: Once | ORAL | Status: AC
  Administered 2017-06-06: 1 via ORAL
  Filled 2017-06-06: qty 1

## 2017-06-06 NOTE — Discharge Instructions (Signed)
Make sure that you drink at least six 8 ounce glasses of water or Gatorade each day in order to stay well-hydrated.  Take Tylenol for mild pain or the pain medicine prescribed for bad pain.  Do not take Tylenol together with the pain medicine prescribed as the combination can be dangerous to your liver.  You can call the number on these instructions to get a primary care physician or call Dr. Clearance CootsHarper to be seen if not feeling better by next week.  Return if concern for any reason

## 2017-06-06 NOTE — ED Triage Notes (Signed)
Pt c/o difficulty swallowing and sore throat x 2 days. Pt states she is worsening with speaking, unable to eat and drink x several days. Pt with fever.

## 2017-06-06 NOTE — ED Provider Notes (Signed)
[facility]-EMERGENCY DEPT Provider Note   CSN: [id number] Arrival date & time: 06/06/17  0746     History   Chief Complaint Chief Complaint  Patient presents with  . Dysphagia  . Sore Throat  . Fever    HPI Meghan Pruitt is a 36 y.o. female.  Complains of sore throat painful swallowing and fever onset 2 days ago sore throat is worse with swallowing not improved by anything associated symptoms include cough thank you and posttussive vomiting 2 or 3 episodes.  She was seen by an urgent care center yesterday.  She reports "they told me nothing was wrong with me"  HPI  Past Medical History:  Diagnosis Date  . Abdominal pain in pregnancy, second trimester 09/14/2016  . Back pain during pregnancy in second trimester 09/14/2016  . Bacterial vaginosis 09/14/2016  . Chronic bronchitis (HCC)   . Hearing loss    left ear    Patient Active Problem List   Diagnosis Date Noted  . Post term pregnancy at [redacted] weeks gestation 01/26/2017  . SVD (spontaneous vaginal delivery) 01/26/2017  . Breast abscess during pregnancy, antepartum 01/09/2017  . Supervision of other normal pregnancy, antepartum 11/25/2016  . Late prenatal care affecting pregnancy in third trimester 11/25/2016  . Bacterial vaginosis 09/14/2016    Past Surgical History:  Procedure Laterality Date  . BREAST BIOPSY Right 02/25/2014   abscess  . NO PAST SURGERIES      OB History    Gravida Para Term Preterm AB Living   3 3 3     3    SAB TAB Ectopic Multiple Live Births         0 1       Home Medications    Prior to Admission medications   Medication Sig Start Date End Date Taking? Authorizing Provider  albuterol (PROVENTIL HFA;VENTOLIN HFA) 108 (90 Base) MCG/ACT inhaler Inhale 1-2 puffs into the lungs every 6 (six) hours as needed for wheezing or shortness of breath. 09/29/15   Trixie DredgeWest, Emily, PA-C  amoxicillin-clavulanate (AUGMENTIN) 875-125 MG tablet Take 1 tablet by mouth 2 (two) times daily.  03/27/17   Brock BadHarper, Charles A, MD  etonogestrel-ethinyl estradiol (NUVARING) 0.12-0.015 MG/24HR vaginal ring Insert vaginally and leave in place for 3 consecutive weeks, then remove for 1 week. 04/03/17   Brock BadHarper, Charles A, MD  ibuprofen (ADVIL,MOTRIN) 800 MG tablet Take 1 tablet (800 mg total) by mouth every 8 (eight) hours as needed. 03/31/17   Brock BadHarper, Charles A, MD  oxyCODONE-acetaminophen (PERCOCET) 10-325 MG tablet Take 1 tablet by mouth every 6 (six) hours as needed for pain. 03/31/17   Brock BadHarper, Charles A, MD  Prenat-FeFmCb-DSS-FA-DHA w/o A (CITRANATAL HARMONY) 27-1-260 MG CAPS Take 1 capsule by mouth daily before breakfast. 11/25/16   Brock BadHarper, Charles A, MD  sulfamethoxazole-trimethoprim (BACTRIM DS,SEPTRA DS) 800-160 MG tablet Take 1 tablet by mouth 2 (two) times daily. 03/27/17   Brock BadHarper, Charles A, MD    Family History Family History  Problem Relation Age of Onset  . Heart disease Maternal Grandfather   . Heart disease Paternal Grandmother   . Breast cancer Paternal Aunt     Social History Social History   Tobacco Use  . Smoking status: Current Some Day Smoker    Packs/day: 0.25    Years: 7.00    Pack years: 1.75    Types: Cigarettes  . Smokeless tobacco: Never Used  . Tobacco comment: only smokes about 1 cigarette a day - causes sickness  Substance  Use Topics  . Alcohol use: No    Comment: occasionally  . Drug use: No     Allergies   Patient has no known allergies.   Review of Systems Review of Systems  Constitutional: Positive for fever.  HENT: Positive for sore throat and trouble swallowing.   Respiratory: Positive for cough.   Gastrointestinal: Positive for vomiting.  Genitourinary:       4 months postpartum.  Not currently breast-feeding  All other systems reviewed and are negative.    Physical Exam Updated Vital Signs BP 105/76   Pulse (!) 136   Temp (!) 102.8 F (39.3 C) (Oral)   Resp (!) 21   LMP  (LMP Unknown) Comment: nuvaring,   SpO2 94%    Physical Exam  Constitutional: She appears well-developed and well-nourished. She appears distressed.  Appears uncomfortable  HENT:  Head: Normocephalic and atraumatic.  Mouth/Throat: Oropharyngeal exudate present.  Bilateral tonsillar exudate.  Tonsils large and red uvula midline  Eyes: Conjunctivae are normal. Pupils are equal, round, and reactive to light.  Neck: Neck supple. No tracheal deviation present. No thyromegaly present.  Cardiovascular: Regular rhythm and normal heart sounds.  No murmur heard. Tachycardic  Pulmonary/Chest: Effort normal and breath sounds normal.  Abdominal: Soft. Bowel sounds are normal. She exhibits no distension. There is no tenderness.  Obese  Musculoskeletal: Normal range of motion. She exhibits no edema or tenderness.  Lymphadenopathy:    She has cervical adenopathy.  Neurological: She is alert. Coordination normal.  Skin: Skin is warm and dry. No rash noted.  Psychiatric: She has a normal mood and affect.  Nursing note and vitals reviewed.    ED Treatments / Results  Labs (all labs ordered are listed, but only abnormal results are displayed) Labs Reviewed  RAPID STREP SCREEN (NOT AT [facility])  CBC WITH DIFFERENTIAL/PLATELET  MONONUCLEOSIS SCREEN  I-STAT BETA HCG BLOOD, ED ([facility], [facility], [facility] ONLY)    EKG  EKG Interpretation None      Results for orders placed or performed during the hospital encounter of 06/06/17  Rapid Strep Screen (Not at [facility])  Result Value Ref Range   Streptococcus, Group A Screen (Direct) POSITIVE (A) NEGATIVE  CBC with Differential/Platelet  Result Value Ref Range   WBC 21.0 (H) 4.0 - 10.5 K/uL   RBC 4.33 3.87 - 5.11 MIL/uL   Hemoglobin 13.5 12.0 - 15.0 g/dL   HCT 56.2 13.0 - 86.5 %   MCV 92.4 78.0 - 100.0 fL   MCH 31.2 26.0 - 34.0 pg   MCHC 33.8 30.0 - 36.0 g/dL   RDW 78.4 69.6 - 29.5 %   Platelets 206 150 - 400 K/uL   Neutrophils Relative % 85 %   Neutro Abs 17.8 (H) 1.7 - 7.7 K/uL   Lymphocytes Relative 8  %   Lymphs Abs 1.8 0.7 - 4.0 K/uL   Monocytes Relative 7 %   Monocytes Absolute 1.4 (H) 0.1 - 1.0 K/uL   Eosinophils Relative 0 %   Eosinophils Absolute 0.0 0.0 - 0.7 K/uL   Basophils Relative 0 %   Basophils Absolute 0.0 0.0 - 0.1 K/uL  Mononucleosis screen  Result Value Ref Range   Mono Screen NEGATIVE NEGATIVE  I-Stat Beta hCG blood, ED ([facility], [facility], [facility] only)  Result Value Ref Range   I-stat hCG, quantitative <5.0 <5 mIU/mL   Comment 3           No results found.  Radiology No results found.  Procedures Procedures (including  critical care time)  Medications Ordered in ED Medications  sodium chloride 0.9 % bolus 2,000 mL (not administered)  morphine 4 MG/ML injection 4 mg (not administered)  acetaminophen (TYLENOL) tablet 1,000 mg (not administered)     Initial Impression / Assessment and Plan / ED Course  I have reviewed the triage vital signs and the nursing notes.  Pertinent labs & imaging results that were available during my care of the patient were reviewed by me and considered in my medical decision making (see chart for details).     10:10 AM pain improved after treatment with intravenous morphine, Tylenol and intravenous fluids. 1135 patient feels ready to go home she was able to drink.  Received 1 Norco tablet prior to discharge.  No adverse reaction to penicillin IM injection plan [facility] [place] [facility].  Referral primary care Final Clinical Impressions(s) / ED Diagnoses  Diagnosis strep pharyngitis Final diagnoses:  None    ED Discharge Orders    None       Doug SouJacubowitz, Greenly Rarick, MD 06/06/17 1145

## 2017-06-06 NOTE — ED Notes (Signed)
Bed: WA12 Expected date:  Expected time:  Means of arrival:  Comments: Hold for triage 2 

## 2018-09-27 ENCOUNTER — Other Ambulatory Visit: Payer: Self-pay

## 2018-09-27 ENCOUNTER — Inpatient Hospital Stay (HOSPITAL_COMMUNITY)
Admission: EM | Admit: 2018-09-27 | Discharge: 2018-09-28 | DRG: 153 | Disposition: A | Discharge status: Home / Self Care | Payer: Medicaid Other | Attending: Internal Medicine | Admitting: Internal Medicine

## 2018-09-27 ENCOUNTER — Emergency Department (HOSPITAL_COMMUNITY): Payer: Medicaid Other

## 2018-09-27 ENCOUNTER — Encounter (HOSPITAL_COMMUNITY): Payer: Self-pay | Admitting: *Deleted

## 2018-09-27 DIAGNOSIS — H6092 Unspecified otitis externa, left ear: Secondary | ICD-10-CM

## 2018-09-27 DIAGNOSIS — Z8249 Family history of ischemic heart disease and other diseases of the circulatory system: Secondary | ICD-10-CM

## 2018-09-27 DIAGNOSIS — Z793 Long term (current) use of hormonal contraceptives: Secondary | ICD-10-CM | POA: Diagnosis not present

## 2018-09-27 DIAGNOSIS — H60502 Unspecified acute noninfective otitis externa, left ear: Secondary | ICD-10-CM | POA: Diagnosis present

## 2018-09-27 DIAGNOSIS — Z79899 Other long term (current) drug therapy: Secondary | ICD-10-CM | POA: Diagnosis not present

## 2018-09-27 DIAGNOSIS — H7092 Unspecified mastoiditis, left ear: Secondary | ICD-10-CM

## 2018-09-27 DIAGNOSIS — H70009 Acute mastoiditis without complications, unspecified ear: Secondary | ICD-10-CM | POA: Diagnosis present

## 2018-09-27 DIAGNOSIS — F17210 Nicotine dependence, cigarettes, uncomplicated: Secondary | ICD-10-CM | POA: Diagnosis present

## 2018-09-27 DIAGNOSIS — Z6841 Body Mass Index (BMI) 40.0 and over, adult: Secondary | ICD-10-CM

## 2018-09-27 DIAGNOSIS — H6692 Otitis media, unspecified, left ear: Secondary | ICD-10-CM | POA: Diagnosis present

## 2018-09-27 DIAGNOSIS — H7292 Unspecified perforation of tympanic membrane, left ear: Secondary | ICD-10-CM | POA: Diagnosis present

## 2018-09-27 DIAGNOSIS — H70002 Acute mastoiditis without complications, left ear: Principal | ICD-10-CM

## 2018-09-27 DIAGNOSIS — H6090 Unspecified otitis externa, unspecified ear: Secondary | ICD-10-CM

## 2018-09-27 DIAGNOSIS — E66813 Obesity, class 3: Secondary | ICD-10-CM

## 2018-09-27 DIAGNOSIS — I10 Essential (primary) hypertension: Secondary | ICD-10-CM | POA: Diagnosis present

## 2018-09-27 DIAGNOSIS — H6690 Otitis media, unspecified, unspecified ear: Secondary | ICD-10-CM

## 2018-09-27 LAB — CREATININE, SERUM
Creatinine, Ser: 0.98 mg/dL (ref 0.44–1.00)
GFR calc Af Amer: >60 mL/min (ref >60)
GFR calc non Af Amer: >60 mL/min (ref >60)

## 2018-09-27 LAB — COMPREHENSIVE METABOLIC PANEL
ALT: 20 U/L (ref 0–44)
AST: 24 U/L (ref 15–41)
Albumin: 4 g/dL (ref 3.5–5.0)
Alkaline Phosphatase: 82 U/L (ref 38–126)
Anion gap: 9 (ref 5–15)
BUN: 10 mg/dL (ref 6–20)
CO2: 25 mmol/L (ref 22–32)
Calcium: 9.2 mg/dL (ref 8.9–10.3)
Chloride: 103 mmol/L (ref 98–111)
Creatinine, Ser: 0.9 mg/dL (ref 0.44–1.00)
GFR calc Af Amer: >60 mL/min (ref >60)
GFR calc non Af Amer: >60 mL/min (ref >60)
Glucose, Bld: 119 mg/dL — ABNORMAL HIGH (ref 70–99)
Potassium: 4.5 mmol/L (ref 3.5–5.1)
Sodium: 137 mmol/L (ref 135–145)
Total Bilirubin: 1.2 mg/dL (ref 0.3–1.2)
Total Protein: 7.8 g/dL (ref 6.5–8.1)

## 2018-09-27 LAB — CBC WITH DIFFERENTIAL/PLATELET
Abs Immature Granulocytes: 0.05 10*3/uL (ref 0.00–0.07)
Basophils Absolute: 0.1 10*3/uL (ref 0.0–0.1)
Basophils Relative: 0 %
Eosinophils Absolute: 0.1 10*3/uL (ref 0.0–0.5)
Eosinophils Relative: 1 %
HCT: 44.1 % (ref 36.0–46.0)
Hemoglobin: 14.3 g/dL (ref 12.0–15.0)
Immature Granulocytes: 0 %
Lymphocytes Relative: 10 %
Lymphs Abs: 1.3 10*3/uL (ref 0.7–4.0)
MCH: 31.2 pg (ref 26.0–34.0)
MCHC: 32.4 g/dL (ref 30.0–36.0)
MCV: 96.1 fL (ref 80.0–100.0)
Monocytes Absolute: 0.9 10*3/uL (ref 0.1–1.0)
Monocytes Relative: 6 %
Neutro Abs: 11.2 10*3/uL — ABNORMAL HIGH (ref 1.7–7.7)
Neutrophils Relative %: 83 %
Platelets: 249 10*3/uL (ref 150–400)
RBC: 4.59 MIL/uL (ref 3.87–5.11)
RDW: 13.4 % (ref 11.5–15.5)
WBC: 13.5 10*3/uL — ABNORMAL HIGH (ref 4.0–10.5)
nRBC: 0 % (ref 0.0–0.2)

## 2018-09-27 LAB — HEMOGLOBIN A1C
Hgb A1c MFr Bld: 6.1 % — ABNORMAL HIGH (ref 4.8–5.6)
Mean Plasma Glucose: 128.37 mg/dL

## 2018-09-27 LAB — GLUCOSE, CAPILLARY
Glucose-Capillary: 110 mg/dL — ABNORMAL HIGH (ref 70–99)
Glucose-Capillary: 112 mg/dL — ABNORMAL HIGH (ref 70–99)

## 2018-09-27 LAB — I-STAT BETA HCG BLOOD, ED (MC, WL, AP ONLY): I-stat hCG, quantitative: <5 m[IU]/mL (ref <5)

## 2018-09-27 MED ORDER — PIPERACILLIN-TAZOBACTAM 3.375 G IVPB 30 MIN
3.3750 g | Freq: Once | INTRAVENOUS | Status: AC
  Administered 2018-09-27: 3.375 g via INTRAVENOUS
  Filled 2018-09-27: qty 50

## 2018-09-27 MED ORDER — ENOXAPARIN SODIUM 60 MG/0.6ML ~~LOC~~ SOLN
55.0000 mg | SUBCUTANEOUS | Status: DC
  Administered 2018-09-27: 55 mg via SUBCUTANEOUS
  Filled 2018-09-27: qty 0.6

## 2018-09-27 MED ORDER — ONDANSETRON HCL 4 MG PO TABS: 4.0000 mg | ORAL_TABLET | Freq: Four times a day (QID) | ORAL | Status: DC | PRN

## 2018-09-27 MED ORDER — FENTANYL CITRATE (PF) 100 MCG/2ML IJ SOLN
50.0000 ug | Freq: Once | INTRAMUSCULAR | Status: AC
  Administered 2018-09-27: 50 ug via INTRAVENOUS
  Filled 2018-09-27: qty 2

## 2018-09-27 MED ORDER — ACETAMINOPHEN 650 MG RE SUPP: 650.0000 mg | Freq: Four times a day (QID) | RECTAL | Status: DC | PRN

## 2018-09-27 MED ORDER — CIPROFLOXACIN-DEXAMETHASONE 0.3-0.1 % OT SUSP
4.0000 [drp] | Freq: Two times a day (BID) | OTIC | Status: DC
  Administered 2018-09-27 – 2018-09-28 (×3): 4 [drp] via OTIC
  Filled 2018-09-27: qty 7.5

## 2018-09-27 MED ORDER — INSULIN ASPART 100 UNIT/ML ~~LOC~~ SOLN: 0.0000 [IU] | Freq: Every day | SUBCUTANEOUS | Status: DC

## 2018-09-27 MED ORDER — IBUPROFEN 200 MG PO TABS: 400.0000 mg | ORAL_TABLET | Freq: Four times a day (QID) | ORAL | Status: DC | PRN

## 2018-09-27 MED ORDER — SODIUM CHLORIDE (PF) 0.9 % IJ SOLN
INTRAMUSCULAR | Status: AC
  Filled 2018-09-27: qty 50

## 2018-09-27 MED ORDER — SODIUM CHLORIDE 0.9 % IV BOLUS
1000.0000 mL | Freq: Once | INTRAVENOUS | Status: AC
  Administered 2018-09-27: 1000 mL via INTRAVENOUS

## 2018-09-27 MED ORDER — PIPERACILLIN-TAZOBACTAM 3.375 G IVPB
3.3750 g | Freq: Three times a day (TID) | INTRAVENOUS | Status: DC
  Administered 2018-09-27 – 2018-09-28 (×3): 3.375 g via INTRAVENOUS
  Filled 2018-09-27 (×3): qty 50

## 2018-09-27 MED ORDER — ACETAMINOPHEN 325 MG PO TABS: 650.0000 mg | ORAL_TABLET | Freq: Four times a day (QID) | ORAL | Status: DC | PRN

## 2018-09-27 MED ORDER — FENTANYL CITRATE (PF) 100 MCG/2ML IJ SOLN
25.0000 ug | Freq: Once | INTRAMUSCULAR | Status: AC
  Administered 2018-09-27: 25 ug via INTRAVENOUS
  Filled 2018-09-27: qty 2

## 2018-09-27 MED ORDER — IBUPROFEN 800 MG PO TABS
800.0000 mg | ORAL_TABLET | Freq: Four times a day (QID) | ORAL | Status: DC | PRN
  Administered 2018-09-27 – 2018-09-28 (×2): 800 mg via ORAL
  Filled 2018-09-27 (×2): qty 1

## 2018-09-27 MED ORDER — ONDANSETRON HCL 4 MG/2ML IJ SOLN
4.0000 mg | Freq: Once | INTRAMUSCULAR | Status: AC
  Administered 2018-09-27: 4 mg via INTRAVENOUS
  Filled 2018-09-27: qty 2

## 2018-09-27 MED ORDER — INSULIN ASPART 100 UNIT/ML ~~LOC~~ SOLN
0.0000 [IU] | Freq: Three times a day (TID) | SUBCUTANEOUS | Status: DC
  Administered 2018-09-28: 2 [IU] via SUBCUTANEOUS

## 2018-09-27 MED ORDER — IOHEXOL 300 MG/ML  SOLN
75.0000 mL | Freq: Once | INTRAMUSCULAR | Status: AC | PRN
  Administered 2018-09-27: 75 mL via INTRAVENOUS

## 2018-09-27 MED ORDER — ONDANSETRON HCL 4 MG/2ML IJ SOLN: 4.0000 mg | Freq: Four times a day (QID) | INTRAMUSCULAR | Status: DC | PRN

## 2018-09-27 NOTE — ED Notes (Signed)
RN informed ED Provider that patient was repositioned sitting up and has put patient on 2L Nasal Canula due patient reaction to pain medication.

## 2018-09-27 NOTE — H&P (Addendum)
History and Physical    Meghan Housekeeperndear C Pettinato  NFA:[id number]  DOB: 04/30/1981  DOA: 09/27/2018 PCP: Fleet ContrasAvbuere, Edwin, MD   Patient coming from: home  Chief Complaint: left ear pain  HPI: Meghan Pruitt is a 38 y.o. female with medical history of ruptured left tympanic membrane who began to have left ear pain yesterday followed by drainage. She has felt febrile and had some vomiting today. She has no other complaints.   ED Course: WBC 13.5  CT > 1. Diffuse thickening of the soft tissues of the left EAC and external ear consistent with otitis externa. No abscess or osseous erosion to indicate malignant otitis externa. 2. Complete left mastoid and left middle ear opacification suggesting otomastoiditis, also without destructive osseous changes. 3. Small chronic right mastoid effusion.  Review of Systems:  All other systems reviewed and apart from HPI, are negative.  Past Medical History:  Diagnosis Date  . Abdominal pain in pregnancy, second trimester 09/14/2016  . Back pain during pregnancy in second trimester 09/14/2016  . Bacterial vaginosis 09/14/2016  . Chronic bronchitis (HCC)   . Hearing loss    left ear    Past Surgical History:  Procedure Laterality Date  . BREAST BIOPSY Right 02/25/2014   abscess  . NO PAST SURGERIES      Social History:   reports that she has been smoking cigarettes. She has a 1.75 pack-year smoking history. She has never used smokeless tobacco. She reports that she does not drink alcohol or use drugs.  No Known Allergies  Family History  Problem Relation Age of Onset  . Heart disease Maternal Grandfather   . Heart disease Paternal Grandmother   . Breast cancer Paternal Aunt      Prior to Admission medications   Medication Sig Start Date End Date Taking? Authorizing Provider  acetaminophen (TYLENOL) 500 MG tablet Take 1,000 mg by mouth every 6 (six) hours as needed for mild pain, moderate pain, fever or headache.   Yes [provider]   albuterol (PROVENTIL HFA;VENTOLIN HFA) 108 (90 Base) MCG/ACT inhaler Inhale 1-2 puffs into the lungs every 6 (six) hours as needed for wheezing or shortness of breath. 09/29/15  Yes West, Emily, PA-C  amoxicillin-clavulanate (AUGMENTIN) 875-125 MG tablet Take 1 tablet by mouth 2 (two) times daily. Patient not taking: Reported on 06/06/2017 03/27/17   Brock BadHarper, Charles A, MD  etonogestrel-ethinyl estradiol (NUVARING) 0.12-0.015 MG/24HR vaginal ring Insert vaginally and leave in place for 3 consecutive weeks, then remove for 1 week. Patient not taking: Reported on 09/27/2018 04/03/17   Brock BadHarper, Charles A, MD  HYDROcodone-acetaminophen [facility](NORCO) 5-325 MG tablet Take 1-2 tablets by mouth every 6 (six) hours as needed for severe pain. Patient not taking: Reported on 09/27/2018 06/06/17   Doug SouJacubowitz, Sam, MD  ibuprofen (ADVIL,MOTRIN) 800 MG tablet Take 1 tablet (800 mg total) by mouth every 8 (eight) hours as needed. Patient not taking: Reported on 09/27/2018 03/31/17   Brock BadHarper, Charles A, MD  oxyCODONE-acetaminophen (PERCOCET) 10-325 MG tablet Take 1 tablet by mouth every 6 (six) hours as needed for pain. Patient not taking: Reported on 06/06/2017 03/31/17   Brock BadHarper, Charles A, MD  Prenat-FeFmCb-DSS-FA-DHA w/o A (CITRANATAL HARMONY) 27-1-260 MG CAPS Take 1 capsule by mouth daily before breakfast. Patient not taking: Reported on 06/06/2017 11/25/16   Brock BadHarper, Charles A, MD  sulfamethoxazole-trimethoprim (BACTRIM DS,SEPTRA DS) 800-160 MG tablet Take 1 tablet by mouth 2 (two) times daily. Patient not taking: Reported on 06/06/2017 03/27/17   Brock BadHarper, Charles A, MD  Physical Exam: Wt Readings from Last 3 Encounters:  09/27/18 108.9 kg  03/31/17 104.3 kg  03/27/17 103 kg   Vitals:   09/27/18 1300 09/27/18 1330 09/27/18 1400 09/27/18 1430  BP: 118/83 (!) 113/95 130/80 (!) 148/105  Pulse: (!) 115 (!) 113 (!) 110 (!) 110  Resp: (!) 26 (!) 21 18 16   Temp:      TempSrc:      SpO2: 97% 98% 95% 97%  Weight:       Height:          Constitutional:  Calm & comfortable Eyes: PERRLA, lids and conjunctivae normal ENT:  Mucous membranes are moist.  Pharynx clear of exudate   Normal dentition.  Left ear is red, quite swollen and tender, mild mastoid tenderness Neck: Supple, no masses  Respiratory:  Clear to auscultation bilaterally  Normal respiratory effort.  Cardiovascular:  S1 & S2 heard, regular rate and rhythm No Murmurs Abdomen:  Non distended No tenderness, No masses Bowel sounds normal Extremities:  No clubbing / cyanosis No pedal edema No joint deformity    Skin:  No rashes, lesions or ulcers Neurologic:  AAO x 3 CN 2-12 grossly intact Sensation intact Strength 5/5 in all 4 extremities Psychiatric:  Normal Mood and affect    Labs on Admission: I have personally reviewed following labs and imaging studies  CBC: Recent Labs  Lab 09/27/18 1059  WBC 13.5*  NEUTROABS 11.2*  HGB 14.3  HCT 44.1  MCV 96.1  PLT 249   Basic Metabolic Panel: Recent Labs  Lab 09/27/18 1059 09/27/18 1100  NA 137  --   K 4.5  --   CL 103  --   CO2 25  --   GLUCOSE 119*  --   BUN 10  --   CREATININE 0.90 0.98  CALCIUM 9.2  --    GFR: Estimated Creatinine Clearance: 93.1 mL/min (by C-G formula based on SCr of 0.98 mg/dL). Liver Function Tests: Recent Labs  Lab 09/27/18 1059  AST 24  ALT 20  ALKPHOS 82  BILITOT 1.2  PROT 7.8  ALBUMIN 4.0   No results for input(s): LIPASE, AMYLASE in the last 168 hours. No results for input(s): AMMONIA in the last 168 hours. Coagulation Profile: No results for input(s): INR, PROTIME in the last 168 hours. Cardiac Enzymes: No results for input(s): CKTOTAL, CKMB, CKMBINDEX, TROPONINI in the last 168 hours. BNP (last 3 results) No results for input(s): PROBNP in the last 8760 hours. HbA1C: No results for input(s): HGBA1C in the last 72 hours. CBG: No results for input(s): GLUCAP in the last 168 hours. Lipid Profile: No results for  input(s): CHOL, HDL, LDLCALC, TRIG, CHOLHDL, LDLDIRECT in the last 72 hours. Thyroid Function Tests: No results for input(s): TSH, T4TOTAL, FREET4, T3FREE, THYROIDAB in the last 72 hours. Anemia Panel: No results for input(s): VITAMINB12, FOLATE, FERRITIN, TIBC, IRON, RETICCTPCT in the last 72 hours. Urine analysis:    Component Value Date/Time   COLORURINE AMBER (A) 12/13/2016 1508   APPEARANCEUR CLOUDY (A) 12/13/2016 1508   LABSPEC 1.026 12/13/2016 1508   PHURINE 5.0 12/13/2016 1508   GLUCOSEU NEGATIVE 12/13/2016 1508   HGBUR NEGATIVE 12/13/2016 1508   BILIRUBINUR NEGATIVE 12/13/2016 1508   KETONESUR 20 (A) 12/13/2016 1508   PROTEINUR 30 (A) 12/13/2016 1508   UROBILINOGEN 1.0 02/24/2013 1709   NITRITE NEGATIVE 12/13/2016 1508   LEUKOCYTESUR NEGATIVE 12/13/2016 1508   Sepsis Labs: @LABRCNTIP (procalcitonin:4,lacticidven:4) )No results found for this or any previous visit (from the  past 240 hour(s)).   Radiological Exams on Admission: Ct Temporal Bones W Contrast  Result Date: 09/27/2018 CLINICAL DATA:  Concern for left malignant otitis externa. Hearing loss and pain. EXAM: CT TEMPORAL BONES WITH CONTRAST TECHNIQUE: Axial and coronal plane CT imaging of the petrous temporal bones was performed with thin-collimation image reconstruction after intravenous contrast administration. Multiplanar CT image reconstructions were also generated. CONTRAST:  75mL OMNIPAQUE IOHEXOL 300 MG/ML  SOLN COMPARISON:  Head CT 09/04/2013 FINDINGS: RIGHT: The external auditory canal is unremarkable. The ossicles appear intact. The tympanic cavity is clear. The internal auditory canal, cochlea, vestibule, and semicircular canals are unremarkable. Small mastoid effusion without coalescence which appears chronic. LEFT: There is diffusely increased enhancement and thickening of the soft tissues of the external ear and external auditory canal with complete opacification of the EAC. No destructive changes are seen  involving the bony EAC, and no abscess is identified in the surrounding soft tissues. The tympanic cavity is completely opacified with the malleus and incus appearing intact. The stapes is not well visualized. The scutum and tegmen appear intact. The mastoid air cells are completely opacified without coalescence. The internal auditory canal, cochlea, vestibule, and semicircular canals are unremarkable. There is minimal right maxillary and ethmoid sinus mucosal thickening. The visualized portion of the brain is unremarkable. Visualized orbits are unremarkable. Small soft tissue nodules in the left parotid gland including a partially visualized 10 mm short axis nodule, most likely reactive lymph nodes given adjacent inflammatory process. IMPRESSION: 1. Diffuse thickening of the soft tissues of the left EAC and external ear consistent with otitis externa. No abscess or osseous erosion to indicate malignant otitis externa. 2. Complete left mastoid and left middle ear opacification suggesting otomastoiditis, also without destructive osseous changes. 3. Small chronic right mastoid effusion. Electronically Signed   By: Sebastian Ache M.D.   On: 09/27/2018 13:19   Assessment/Plan Active Problems:   Mastoiditis, acute   Otitis externa   Otitis media  - ED has started Zosyn and Ciprodex drops per ENT recommendations  - EDP spoke with Dr [name] who recommended outpt f/u in his office     Obesity, Class III, BMI 40-49.9 (morbid obesity) Body mass index is 43.9 kg/m.  - will ask nutritionist to talk to her about eating healthy to lose weight  ? Metabolic syndrome - along with obesity, she has HTN and an elevated Glucose - will check A1c and start SSI to follow her glucose in the hospital - follow BP- may need to add a medication if persistently elevated  DVT prophylaxis: Lovenox Code Status: Full code  Family Communication:   Disposition Plan: home after 24-48 hrs of IV antibiotics  Consults called: none   Admission status: inpatient    Calvert Cantor MD [facility] Pager: [email or web address] Password [email or web address] 7PM-7AM, please contact night-coverage   09/27/2018, 2:39 PM

## 2018-09-27 NOTE — ED Triage Notes (Signed)
Pt states she can not hear out of Left ear starting yesterday.Increased pain and states she has a headache, body aches, dizzy and vomiting.

## 2018-09-27 NOTE — Progress Notes (Signed)
Pharmacy Antibiotic Note  Meghan Pruitt is a 38 y.o. female admitted on 09/27/2018 with mastoiditis and otitis media.  PMH significant for ruptured left tempanic membrane.  Patient being admitted for IV antibiotics. Pharmacy has been consulted for Zosyn dosing.  Plan: Zosyn 3.375g IV q8h (4 hour infusion).  Need for further dosage adjustment appears unlikely at present.    Will sign off at this time.  Please reconsult if a change in clinical status warrants re-evaluation of dosage.  Height: 5\' 2"  (157.5 cm) Weight: 240 lb (108.9 kg) IBW/kg (Calculated) : 50.1  Temp (24hrs), Avg:98.4 F (36.9 C), Min:98.4 F (36.9 C), Max:98.4 F (36.9 C)  Recent Labs  Lab 09/27/18 1059 09/27/18 1100  WBC 13.5*  --   CREATININE 0.90 0.98    Estimated Creatinine Clearance: 93.1 mL/min (by C-G formula based on SCr of 0.98 mg/dL).    No Known Allergies   Thank you for allowing pharmacy to be a part of this patient's care.  Maryellen Pile, PharmD 09/27/2018 2:51 PM

## 2018-09-27 NOTE — ED Notes (Signed)
Patient has ambulated to restroom without complication or assistance. 

## 2018-09-27 NOTE — Consult Note (Signed)
Reason for Consult: Ear infection Referring Physician: Hospitalist  Meghan Pruitt is an 38 y.o. female.  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  Surgery was considered 6 years ago but she was advised to quit smoking before tympanoplasty and she has put that off.  Yesterday, she developed drainage and pain in the left ear and her hearing loss worsened.  She came to the ER today where CT imaging was performed and she was admitted on IV Zosyn and Ciprodex drops.  Past Medical History:  Diagnosis Date  . Abdominal pain in pregnancy, second trimester 09/14/2016  . Back pain during pregnancy in second trimester 09/14/2016  . Bacterial vaginosis 09/14/2016  . Chronic bronchitis (HCC)   . Hearing loss    left ear    Past Surgical History:  Procedure Laterality Date  . BREAST BIOPSY Right 02/25/2014   abscess  . NO PAST SURGERIES      Family History  Problem Relation Age of Onset  . Heart disease Maternal Grandfather   . Heart disease Paternal Grandmother   . Breast cancer Paternal Aunt     Social History:  reports that she has been smoking cigarettes. She has a 1.75 pack-year smoking history. She has never used smokeless tobacco. She reports that she does not drink alcohol or use drugs.  Allergies: No Known Allergies  Medications: I have reviewed the patient's current medications.  Results for orders placed or performed during the hospital encounter of 09/27/18 (from the past 48 hour(s))  Comprehensive metabolic panel     Status: Abnormal   Collection Time: 09/27/18 10:59 AM  Result Value Ref Range   Sodium 137 135 - 145 mmol/L   Potassium 4.5 3.5 - 5.1 mmol/L   Chloride 103 98 - 111 mmol/L   CO2 25 22 - 32 mmol/L   Glucose, Bld 119 (H) 70 - 99 mg/dL   BUN 10 6 - 20 mg/dL   Creatinine, Ser 1.61 0.44 - 1.00 mg/dL   Calcium 9.2 8.9 - 09.6 mg/dL   Total Protein 7.8 6.5 - 8.1 g/dL   Albumin 4.0  3.5 - 5.0 g/dL   AST 24 15 - 41 U/L   ALT 20 0 - 44 U/L   Alkaline Phosphatase 82 38 - 126 U/L   Total Bilirubin 1.2 0.3 - 1.2 mg/dL   GFR calc non Af Amer >60 >60 mL/min   GFR calc Af Amer >60 >60 mL/min   Anion gap 9 5 - 15    Comment: Performed at [facility], [address] [place], [place] [address]  CBC with Differential     Status: Abnormal   Collection Time: 09/27/18 10:59 AM  Result Value Ref Range   WBC 13.5 (H) 4.0 - 10.5 K/uL   RBC 4.59 3.87 - 5.11 MIL/uL   Hemoglobin 14.3 12.0 - 15.0 g/dL   HCT 98.1 19.1 - 47.8 %   MCV 96.1 80.0 - 100.0 fL   MCH 31.2 26.0 - 34.0 pg   MCHC 32.4 30.0 - 36.0 g/dL   RDW 29.5 62.1 - 30.8 %   Platelets 249 150 - 400 K/uL   nRBC 0.0 0.0 - 0.2 %   Neutrophils Relative % 83 %   Neutro Abs 11.2 (H) 1.7 - 7.7 K/uL   Lymphocytes Relative 10 %   Lymphs Abs 1.3 0.7 - 4.0 K/uL   Monocytes Relative 6 %  Monocytes Absolute 0.9 0.1 - 1.0 K/uL   Eosinophils Relative 1 %   Eosinophils Absolute 0.1 0.0 - 0.5 K/uL   Basophils Relative 0 %   Basophils Absolute 0.1 0.0 - 0.1 K/uL   Immature Granulocytes 0 %   Abs Immature Granulocytes 0.05 0.00 - 0.07 K/uL    Comment: Performed at [facility], [address] [place], [place] [address]  Creatinine, serum     Status: None   Collection Time: 09/27/18 11:00 AM  Result Value Ref Range   Creatinine, Ser 0.98 0.44 - 1.00 mg/dL   GFR calc non Af Amer >60 >60 mL/min   GFR calc Af Amer >60 >60 mL/min    Comment: Performed at [facility], [address] [place], [place] [address]  I-Stat beta hCG blood, ED     Status: None   Collection Time: 09/27/18 11:07 AM  Result Value Ref Range   I-stat hCG, quantitative <5.0 <5 mIU/mL   Comment 3            Comment:   GEST. AGE      CONC.  (mIU/mL)   <=1 WEEK        5 - 50     2 WEEKS       50 - 500     3 WEEKS       100 - 10,000     4 WEEKS     1,000 - 30,000        FEMALE AND NON-PREGNANT FEMALE:      LESS THAN 5 mIU/mL   Glucose, capillary     Status: Abnormal   Collection Time: 09/27/18  4:53 PM  Result Value Ref Range   Glucose-Capillary 110 (H) 70 - 99 mg/dL    Ct Temporal Bones W Contrast  Result Date: 09/27/2018 CLINICAL DATA:  Concern for left malignant otitis externa. Hearing loss and pain. EXAM: CT TEMPORAL BONES WITH CONTRAST TECHNIQUE: Axial and coronal plane CT imaging of the petrous temporal bones was performed with thin-collimation image reconstruction after intravenous contrast administration. Multiplanar CT image reconstructions were also generated. CONTRAST:  75mL OMNIPAQUE IOHEXOL 300 MG/ML  SOLN COMPARISON:  Head CT 09/04/2013 FINDINGS: RIGHT: The external auditory canal is unremarkable. The ossicles appear intact. The tympanic cavity is clear. The internal auditory canal, cochlea, vestibule, and semicircular canals are unremarkable. Small mastoid effusion without coalescence which appears chronic. LEFT: There is diffusely increased enhancement and thickening of the soft tissues of the external ear and external auditory canal with complete opacification of the EAC. No destructive changes are seen involving the bony EAC, and no abscess is identified in the surrounding soft tissues. The tympanic cavity is completely opacified with the malleus and incus appearing intact. The stapes is not well visualized. The scutum and tegmen appear intact. The mastoid air cells are completely opacified without coalescence. The internal auditory canal, cochlea, vestibule, and semicircular canals are unremarkable. There is minimal right maxillary and ethmoid sinus mucosal thickening. The visualized portion of the brain is unremarkable. Visualized orbits are unremarkable. Small soft tissue nodules in the left parotid gland including a partially visualized 10 mm short axis nodule, most likely reactive lymph nodes given adjacent inflammatory process. IMPRESSION: 1. Diffuse thickening of the soft tissues of  the left EAC and external ear consistent with otitis externa. No abscess or osseous erosion to indicate malignant otitis externa. 2. Complete left mastoid and left middle ear opacification suggesting otomastoiditis, also without destructive osseous changes.  3. Small chronic right mastoid effusion. Electronically Signed   By: Sebastian Ache M.D.   On: 09/27/2018 13:19    Review of Systems  HENT: Positive for ear discharge, ear pain and hearing loss.   All other systems reviewed and are negative.  Blood pressure 120/76, pulse (!) 111, temperature 99 F (37.2 C), temperature source Oral, resp. rate 20, height 5\' 2"  (1.575 m), weight 108.9 kg, last menstrual period 09/13/2018, SpO2 96 %, currently breastfeeding. Physical Exam  Constitutional: She is oriented to person, place, and time. She appears well-developed and well-nourished. No distress.  HENT:  Head: Normocephalic and atraumatic.  Right Ear: External ear normal.  Nose: Nose normal.  Mouth/Throat: Oropharynx is clear and moist.  Left external ear edematous, some surrounding tenderness, ear canal edema, no otoscope available  Eyes: Pupils are equal, round, and reactive to light. Conjunctivae and EOM are normal.  Neck: Normal range of motion. Neck supple.  Cardiovascular: Normal rate.  Respiratory: Effort normal.  Musculoskeletal: Normal range of motion.  Neurological: She is alert and oriented to person, place, and time. No cranial nerve deficit.  Skin: Skin is warm and dry.  Psychiatric: She has a normal mood and affect. Her behavior is normal. Judgment and thought content normal.    Assessment/Plan: Left acute otitis externa, otitis media, mastoiditis, otorrhea, and presumed TM perforation  The ear exam was limited in the hospital.  I personally reviewed her temporal bone CT demonstrating opacification of the left mastoid, middle ear, and ear canal.  I agree with systemic antibiotic therapy along with Ciprodex drops.  She can  follow-up late this week after discharge.  Christia Reading 09/27/2018, 8:43 PM

## 2018-09-27 NOTE — ED Provider Notes (Signed)
[facility]-EMERGENCY DEPT Provider Note   CSN: [id number] Arrival date & time: 09/27/18  0946    History   Chief Complaint Chief Complaint  Patient presents with  . Otalgia    HPI Meghan Pruitt is a 38 y.o. female.     HPI Patient presents with left ear pain and swelling.  States began yesterday.  States she feels like the room spinning around.  Also has had chills.  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  Has had infections of the external ear 2.  Has had vomiting.  She is a smoker.  Not diabetic.  Has been seen by [facility] ENT previously. Past Medical History:  Diagnosis Date  . Abdominal pain in pregnancy, second trimester 09/14/2016  . Back pain during pregnancy in second trimester 09/14/2016  . Bacterial vaginosis 09/14/2016  . Chronic bronchitis (HCC)   . Hearing loss    left ear    Patient Active Problem List   Diagnosis Date Noted  . Post term pregnancy at [redacted] weeks gestation 01/26/2017  . SVD (spontaneous vaginal delivery) 01/26/2017  . Breast abscess during pregnancy, antepartum 01/09/2017  . Supervision of other normal pregnancy, antepartum 11/25/2016  . Late prenatal care affecting pregnancy in third trimester 11/25/2016  . Bacterial vaginosis 09/14/2016    Past Surgical History:  Procedure Laterality Date  . BREAST BIOPSY Right 02/25/2014   abscess  . NO PAST SURGERIES       OB History    Gravida  3   Para  3   Term  3   Preterm      AB      Living  3     SAB      TAB      Ectopic      Multiple  0   Live Births  1            Home Medications    Prior to Admission medications   Medication Sig Start Date End Date Taking? Authorizing Provider  acetaminophen (TYLENOL) 500 MG tablet Take 1,000 mg by mouth every 6 (six) hours as needed for mild pain, moderate pain, fever or headache.    [provider]  albuterol (PROVENTIL HFA;VENTOLIN HFA) 108 (90 Base) MCG/ACT inhaler Inhale 1-2 puffs into the  lungs every 6 (six) hours as needed for wheezing or shortness of breath. 09/29/15   Trixie Dredge, PA-C  amoxicillin-clavulanate (AUGMENTIN) 875-125 MG tablet Take 1 tablet by mouth 2 (two) times daily. Patient not taking: Reported on 06/06/2017 03/27/17   Brock Bad, MD  etonogestrel-ethinyl estradiol (NUVARING) 0.12-0.015 MG/24HR vaginal ring Insert vaginally and leave in place for 3 consecutive weeks, then remove for 1 week. 04/03/17   Brock Bad, MD  HYDROcodone-acetaminophen (NORCO) 5-325 MG tablet Take 1-2 tablets by mouth every 6 (six) hours as needed for severe pain. 06/06/17   Doug Sou, MD  ibuprofen (ADVIL,MOTRIN) 800 MG tablet Take 1 tablet (800 mg total) by mouth every 8 (eight) hours as needed. Patient taking differently: Take 800 mg by mouth every 8 (eight) hours as needed for moderate pain.  03/31/17   Brock Bad, MD  oxyCODONE-acetaminophen (PERCOCET) 10-325 MG tablet Take 1 tablet by mouth every 6 (six) hours as needed for pain. Patient not taking: Reported on 06/06/2017 03/31/17   Brock Bad, MD  Prenat-FeFmCb-DSS-FA-DHA w/o A (CITRANATAL HARMONY) 27-1-260 MG CAPS Take 1 capsule by mouth daily before breakfast. Patient not taking: Reported on 06/06/2017  11/25/16   Brock BadHarper, Charles A, MD  sulfamethoxazole-trimethoprim (BACTRIM DS,SEPTRA DS) 800-160 MG tablet Take 1 tablet by mouth 2 (two) times daily. Patient not taking: Reported on 06/06/2017 03/27/17   Brock BadHarper, Charles A, MD    Family History Family History  Problem Relation Age of Onset  . Heart disease Maternal Grandfather   . Heart disease Paternal Grandmother   . Breast cancer Paternal Aunt     Social History Social History   Tobacco Use  . Smoking status: Current Some Day Smoker    Packs/day: 0.25    Years: 7.00    Pack years: 1.75    Types: Cigarettes  . Smokeless tobacco: Never Used  . Tobacco comment: only smokes about 1 cigarette a day - causes sickness  Substance Use Topics   . Alcohol use: No    Comment: occasionally  . Drug use: No     Allergies   Patient has no known allergies.   Review of Systems Review of Systems  Constitutional: Positive for appetite change and chills.  HENT: Positive for ear discharge, ear pain and hearing loss. Negative for congestion and trouble swallowing.   Respiratory: Negative for shortness of breath.   Cardiovascular: Negative for chest pain.  Gastrointestinal: Positive for nausea and vomiting.  Genitourinary: Negative for flank pain.  Musculoskeletal: Negative for back pain.  Skin: Negative for rash.  Neurological: Positive for headaches.  Psychiatric/Behavioral: Negative for confusion.     Physical Exam Updated Vital Signs BP 118/83   Pulse (!) 115   Temp 98.4 F (36.9 C) (Oral)   Resp (!) 26   Ht 5\' 2"  (1.575 m)   Wt 108.9 kg   LMP 09/13/2018   SpO2 97%   BMI 43.90 kg/m   Physical Exam Vitals signs and nursing note reviewed.  Constitutional:      Appearance: Normal appearance.  HENT:     Head:     Comments: Left external ear swollen and erythematous.  There is purulent drainage in the ear canal.  Unable to visualize left TM. Eyes:     Pupils: Pupils are equal, round, and reactive to light.  Neck:     Musculoskeletal: No neck rigidity.  Cardiovascular:     Comments: Tachycardia Pulmonary:     Breath sounds: No rales.  Abdominal:     Tenderness: There is no abdominal tenderness.  Musculoskeletal:        General: No tenderness.  Skin:    General: Skin is warm.     Capillary Refill: Capillary refill takes less than 2 seconds.  Neurological:     Mental Status: She is oriented to person, place, and time.      ED Treatments / Results  Labs (all labs ordered are listed, but only abnormal results are displayed) Labs Reviewed  COMPREHENSIVE METABOLIC PANEL - Abnormal; Notable for the following components:      Result Value   Glucose, Bld 119 (*)    All other components within normal limits   CBC WITH DIFFERENTIAL/PLATELET - Abnormal; Notable for the following components:   WBC 13.5 (*)    Neutro Abs 11.2 (*)    All other components within normal limits  I-STAT BETA HCG BLOOD, ED ([facility], [facility], [facility] ONLY)    EKG None  Radiology Ct Temporal Bones W Contrast  Result Date: 09/27/2018 CLINICAL DATA:  Concern for left malignant otitis externa. Hearing loss and pain. EXAM: CT TEMPORAL BONES WITH CONTRAST TECHNIQUE: Axial and coronal plane CT imaging of the petrous  temporal bones was performed with thin-collimation image reconstruction after intravenous contrast administration. Multiplanar CT image reconstructions were also generated. CONTRAST:  75mL OMNIPAQUE IOHEXOL 300 MG/ML  SOLN COMPARISON:  Head CT 09/04/2013 FINDINGS: RIGHT: The external auditory canal is unremarkable. The ossicles appear intact. The tympanic cavity is clear. The internal auditory canal, cochlea, vestibule, and semicircular canals are unremarkable. Small mastoid effusion without coalescence which appears chronic. LEFT: There is diffusely increased enhancement and thickening of the soft tissues of the external ear and external auditory canal with complete opacification of the EAC. No destructive changes are seen involving the bony EAC, and no abscess is identified in the surrounding soft tissues. The tympanic cavity is completely opacified with the malleus and incus appearing intact. The stapes is not well visualized. The scutum and tegmen appear intact. The mastoid air cells are completely opacified without coalescence. The internal auditory canal, cochlea, vestibule, and semicircular canals are unremarkable. There is minimal right maxillary and ethmoid sinus mucosal thickening. The visualized portion of the brain is unremarkable. Visualized orbits are unremarkable. Small soft tissue nodules in the left parotid gland including a partially visualized 10 mm short axis nodule, most likely reactive lymph nodes given adjacent  inflammatory process. IMPRESSION: 1. Diffuse thickening of the soft tissues of the left EAC and external ear consistent with otitis externa. No abscess or osseous erosion to indicate malignant otitis externa. 2. Complete left mastoid and left middle ear opacification suggesting otomastoiditis, also without destructive osseous changes. 3. Small chronic right mastoid effusion. Electronically Signed   By: Sebastian Ache M.D.   On: 09/27/2018 13:19    Procedures Procedures (including critical care time)  Medications Ordered in ED Medications  sodium chloride (PF) 0.9 % injection (0 mLs  Hold 09/27/18 1206)  fentaNYL (SUBLIMAZE) injection 25 mcg (has no administration in time range)  sodium chloride 0.9 % bolus 1,000 mL (0 mLs Intravenous Stopped 09/27/18 1206)  fentaNYL (SUBLIMAZE) injection 50 mcg (50 mcg Intravenous Given 09/27/18 1050)  ondansetron (ZOFRAN) injection 4 mg (4 mg Intravenous Given 09/27/18 1052)  piperacillin-tazobactam (ZOSYN) IVPB 3.375 g (0 g Intravenous Stopped 09/27/18 1127)  iohexol (OMNIPAQUE) 300 MG/ML solution 75 mL (75 mLs Intravenous Contrast Given 09/27/18 1139)     Initial Impression / Assessment and Plan / ED Course  I have reviewed the triage vital signs and the nursing notes.  Pertinent labs & imaging results that were available during my care of the patient were reviewed by me and considered in my medical decision making (see chart for details).        Patient presents with ear pain.  Chills.  Appears to have external and internal infection of ear.  Discussed with Dr. [name] from ENT and he was reviewing the scans.  States would recommend Ciprodex drops and IV antibiotics.  Follow-up as an outpatient for repair of the TM.  Will admit to internal medicine.  Final Clinical Impressions(s) / ED Diagnoses   Final diagnoses:  Otitis externa of left ear, unspecified chronicity, unspecified type  Mastoiditis of left side    ED Discharge Orders    None        Benjiman Core, MD 09/27/18 1337

## 2018-09-27 NOTE — ED Notes (Signed)
Report given to [facility] for 5W, 1528.

## 2018-09-27 NOTE — Plan of Care (Signed)

## 2018-09-27 NOTE — ED Notes (Signed)
ED TO INPATIENT HANDOFF REPORT  Name/Age/Gender Meghan Pruitt 38 y.o. female  Code Status    Code Status Orders  (From admission, onward)         Start     Ordered   09/27/18 1352  Full code  Continuous     09/27/18 1352        Code Status History    Date Active Date Inactive Code Status Order ID Comments User Context   01/26/2017 1907 01/28/2017 1525 Full Code [id number]  DegeleKandra Nicolas, Julie P, MD Inpatient   01/26/2017 0811 01/26/2017 1907 Full Code [id number]  Arabella MerlesShaw, Kimberly D, CNM Inpatient      Home/SNF/Other Home  Chief Complaint ear pain   Level of Care/Admitting Diagnosis ED Disposition    ED Disposition Condition Comment   Admit  [facility] [[id number]]  Level of Care: Med-Surg [16]  Covid Evaluation: N/A  Diagnosis: Mastoiditis, acute [086578][[id number]]  Admitting Physician: Calvert CantorIZWAN, SAIMA [[id number]]  Attending Physician: Calvert CantorIZWAN, SAIMA [[id number]]  Estimated length of stay: past midnight tomorrow  Certification:: I certify this patient will need inpatient services for at least 2 midnights  PT Class (Do Not Modify): Inpatient [101]  PT Acc Code (Do Not Modify): Private [1]       Medical History Past Medical History:  Diagnosis Date  . Abdominal pain in pregnancy, second trimester 09/14/2016  . Back pain during pregnancy in second trimester 09/14/2016  . Bacterial vaginosis 09/14/2016  . Chronic bronchitis (HCC)   . Hearing loss    left ear    Allergies No Known Allergies  IV Location/Drains/Wounds Patient Lines/Drains/Airways Status   Active Line/Drains/Airways    Name:   Placement date:   Placement time:   Site:   Days:   Peripheral IV 09/27/18 Left;Upper Forearm   09/27/18    1047    Forearm   less than 1          Labs/Imaging Results for orders placed or performed during the hospital encounter of 09/27/18 (from the past 48 hour(s))  Comprehensive metabolic panel     Status: Abnormal   Collection Time: 09/27/18 10:59 AM  Result  Value Ref Range   Sodium 137 135 - 145 mmol/L   Potassium 4.5 3.5 - 5.1 mmol/L   Chloride 103 98 - 111 mmol/L   CO2 25 22 - 32 mmol/L   Glucose, Bld 119 (H) 70 - 99 mg/dL   BUN 10 6 - 20 mg/dL   Creatinine, Ser 4.690.90 0.44 - 1.00 mg/dL   Calcium 9.2 8.9 - 62.910.3 mg/dL   Total Protein 7.8 6.5 - 8.1 g/dL   Albumin 4.0 3.5 - 5.0 g/dL   AST 24 15 - 41 U/L   ALT 20 0 - 44 U/L   Alkaline Phosphatase 82 38 - 126 U/L   Total Bilirubin 1.2 0.3 - 1.2 mg/dL   GFR calc non Af Amer >60 >60 mL/min   GFR calc Af Amer >60 >60 mL/min   Anion gap 9 5 - 15    Comment: Performed at [facility], [address] [place], [place] [address]  CBC with Differential     Status: Abnormal   Collection Time: 09/27/18 10:59 AM  Result Value Ref Range   WBC 13.5 (H) 4.0 - 10.5 K/uL   RBC 4.59 3.87 - 5.11 MIL/uL   Hemoglobin 14.3 12.0 - 15.0 g/dL   HCT 32.444.1 40.136.0 - 02.746.0 %   MCV 96.1 80.0 - 100.0 fL  MCH 31.2 26.0 - 34.0 pg   MCHC 32.4 30.0 - 36.0 g/dL   RDW 16.1 09.6 - 04.5 %   Platelets 249 150 - 400 K/uL   nRBC 0.0 0.0 - 0.2 %   Neutrophils Relative % 83 %   Neutro Abs 11.2 (H) 1.7 - 7.7 K/uL   Lymphocytes Relative 10 %   Lymphs Abs 1.3 0.7 - 4.0 K/uL   Monocytes Relative 6 %   Monocytes Absolute 0.9 0.1 - 1.0 K/uL   Eosinophils Relative 1 %   Eosinophils Absolute 0.1 0.0 - 0.5 K/uL   Basophils Relative 0 %   Basophils Absolute 0.1 0.0 - 0.1 K/uL   Immature Granulocytes 0 %   Abs Immature Granulocytes 0.05 0.00 - 0.07 K/uL    Comment: Performed at [facility], [address] [place], [place] [address]  Creatinine, serum     Status: None   Collection Time: 09/27/18 11:00 AM  Result Value Ref Range   Creatinine, Ser 0.98 0.44 - 1.00 mg/dL   GFR calc non Af Amer >60 >60 mL/min   GFR calc Af Amer >60 >60 mL/min    Comment: Performed at [facility], [address] [place], [place] [address]  I-Stat beta hCG blood, ED     Status: None    Collection Time: 09/27/18 11:07 AM  Result Value Ref Range   I-stat hCG, quantitative <5.0 <5 mIU/mL   Comment 3            Comment:   GEST. AGE      CONC.  (mIU/mL)   <=1 WEEK        5 - 50     2 WEEKS       50 - 500     3 WEEKS       100 - 10,000     4 WEEKS     1,000 - 30,000        FEMALE AND NON-PREGNANT FEMALE:     LESS THAN 5 mIU/mL    Ct Temporal Bones W Contrast  Result Date: 09/27/2018 CLINICAL DATA:  Concern for left malignant otitis externa. Hearing loss and pain. EXAM: CT TEMPORAL BONES WITH CONTRAST TECHNIQUE: Axial and coronal plane CT imaging of the petrous temporal bones was performed with thin-collimation image reconstruction after intravenous contrast administration. Multiplanar CT image reconstructions were also generated. CONTRAST:  75mL OMNIPAQUE IOHEXOL 300 MG/ML  SOLN COMPARISON:  Head CT 09/04/2013 FINDINGS: RIGHT: The external auditory canal is unremarkable. The ossicles appear intact. The tympanic cavity is clear. The internal auditory canal, cochlea, vestibule, and semicircular canals are unremarkable. Small mastoid effusion without coalescence which appears chronic. LEFT: There is diffusely increased enhancement and thickening of the soft tissues of the external ear and external auditory canal with complete opacification of the EAC. No destructive changes are seen involving the bony EAC, and no abscess is identified in the surrounding soft tissues. The tympanic cavity is completely opacified with the malleus and incus appearing intact. The stapes is not well visualized. The scutum and tegmen appear intact. The mastoid air cells are completely opacified without coalescence. The internal auditory canal, cochlea, vestibule, and semicircular canals are unremarkable. There is minimal right maxillary and ethmoid sinus mucosal thickening. The visualized portion of the brain is unremarkable. Visualized orbits are unremarkable. Small soft tissue nodules in the left parotid gland  including a partially visualized 10 mm short axis nodule, most likely reactive lymph nodes given adjacent inflammatory  process. IMPRESSION: 1. Diffuse thickening of the soft tissues of the left EAC and external ear consistent with otitis externa. No abscess or osseous erosion to indicate malignant otitis externa. 2. Complete left mastoid and left middle ear opacification suggesting otomastoiditis, also without destructive osseous changes. 3. Small chronic right mastoid effusion. Electronically Signed   By: Sebastian Ache M.D.   On: 09/27/2018 13:19    Pending Labs Unresulted Labs (From admission, onward)    Start     Ordered   10/04/18 0500  Creatinine, serum  (enoxaparin (LOVENOX)    CrCl >/= 30 ml/min)  Weekly,   R    Comments:  while on enoxaparin therapy    09/27/18 1352   09/28/18 0500  Basic metabolic panel  Tomorrow morning,   R     09/27/18 1352   09/28/18 0500  CBC  Tomorrow morning,   R     09/27/18 1352   09/27/18 1444  Hemoglobin A1c  Once,   R     09/27/18 1443   09/27/18 1351  HIV antibody (Routine Testing)  Once,   R     09/27/18 1352          Vitals/Pain Today's Vitals   09/27/18 1330 09/27/18 1400 09/27/18 1430 09/27/18 1435  BP: (!) 113/95 130/80 (!) 148/105   Pulse: (!) 113 (!) 110 (!) 110   Resp: (!) 21 18 16    Temp:      TempSrc:      SpO2: 98% 95% 98%   Weight:      Height:      PainSc:    4     Isolation Precautions No active isolations  Medications Medications  sodium chloride (PF) 0.9 % injection (0 mLs  Hold 09/27/18 1206)  ciprofloxacin-dexamethasone (CIPRODEX) 0.3-0.1 % OTIC (EAR) suspension 4 drop (4 drops Left EAR Given 09/27/18 1348)  enoxaparin (LOVENOX) injection 55 mg (has no administration in time range)  acetaminophen (TYLENOL) tablet 650 mg (has no administration in time range)    Or  acetaminophen (TYLENOL) suppository 650 mg (has no administration in time range)  ondansetron (ZOFRAN) tablet 4 mg (has no administration in time range)     Or  ondansetron (ZOFRAN) injection 4 mg (has no administration in time range)  ibuprofen (ADVIL) tablet 800 mg (has no administration in time range)  insulin aspart (novoLOG) injection 0-9 Units (has no administration in time range)  insulin aspart (novoLOG) injection 0-5 Units (has no administration in time range)  piperacillin-tazobactam (ZOSYN) IVPB 3.375 g (has no administration in time range)  sodium chloride 0.9 % bolus 1,000 mL (0 mLs Intravenous Stopped 09/27/18 1206)  fentaNYL (SUBLIMAZE) injection 50 mcg (50 mcg Intravenous Given 09/27/18 1050)  ondansetron (ZOFRAN) injection 4 mg (4 mg Intravenous Given 09/27/18 1052)  piperacillin-tazobactam (ZOSYN) IVPB 3.375 g (0 g Intravenous Stopped 09/27/18 1127)  iohexol (OMNIPAQUE) 300 MG/ML solution 75 mL (75 mLs Intravenous Contrast Given 09/27/18 1139)  fentaNYL (SUBLIMAZE) injection 25 mcg (25 mcg Intravenous Given 09/27/18 1346)    Mobility walks

## 2018-09-28 ENCOUNTER — Telehealth: Payer: Self-pay | Admitting: *Deleted

## 2018-09-28 DIAGNOSIS — H6690 Otitis media, unspecified, unspecified ear: Secondary | ICD-10-CM

## 2018-09-28 DIAGNOSIS — H60502 Unspecified acute noninfective otitis externa, left ear: Secondary | ICD-10-CM

## 2018-09-28 LAB — CBC
HCT: 42.5 % (ref 36.0–46.0)
Hemoglobin: 13.2 g/dL (ref 12.0–15.0)
MCH: 30.5 pg (ref 26.0–34.0)
MCHC: 31.1 g/dL (ref 30.0–36.0)
MCV: 98.2 fL (ref 80.0–100.0)
Platelets: 224 10*3/uL (ref 150–400)
RBC: 4.33 MIL/uL (ref 3.87–5.11)
RDW: 13.2 % (ref 11.5–15.5)
WBC: 11.3 10*3/uL — ABNORMAL HIGH (ref 4.0–10.5)
nRBC: 0 % (ref 0.0–0.2)

## 2018-09-28 LAB — BASIC METABOLIC PANEL
Anion gap: 9 (ref 5–15)
BUN: 9 mg/dL (ref 6–20)
CO2: 25 mmol/L (ref 22–32)
Calcium: 8.7 mg/dL — ABNORMAL LOW (ref 8.9–10.3)
Chloride: 104 mmol/L (ref 98–111)
Creatinine, Ser: 0.98 mg/dL (ref 0.44–1.00)
GFR calc Af Amer: >60 mL/min (ref >60)
GFR calc non Af Amer: >60 mL/min (ref >60)
Glucose, Bld: 135 mg/dL — ABNORMAL HIGH (ref 70–99)
Potassium: 3.5 mmol/L (ref 3.5–5.1)
Sodium: 138 mmol/L (ref 135–145)

## 2018-09-28 LAB — GLUCOSE, CAPILLARY: Glucose-Capillary: 178 mg/dL — ABNORMAL HIGH (ref 70–99)

## 2018-09-28 LAB — HIV ANTIBODY (ROUTINE TESTING W REFLEX): HIV Screen 4th Generation wRfx: NONREACTIVE

## 2018-09-28 MED ORDER — CIPROFLOXACIN HCL 500 MG PO TABS: 500.0000 mg | ORAL_TABLET | Freq: Two times a day (BID) | ORAL | 0 refills | Status: AC

## 2018-09-28 MED ORDER — CIPROFLOXACIN-DEXAMETHASONE 0.3-0.1 % OT SUSP: 4.0000 [drp] | Freq: Two times a day (BID) | OTIC | 0 refills | Status: DC

## 2018-09-28 NOTE — Telephone Encounter (Signed)
ED TOC CM- medication issues  Received call from [facility] pharmacy stating MATCH letter had effective date of 09/29/2018, went into [employer] and updated for effective date 09/28/2018. Isidoro Donning RN CCM Case Mgmt phone [phone]

## 2018-09-28 NOTE — Progress Notes (Signed)
Discharge and medication instructions reviewed with patient. Questions answered. Patient denies further questions. No prescriptions given. Family member is here to drive patient home. Lina Sar, RN

## 2018-09-28 NOTE — Discharge Instructions (Signed)
Weight Loss Tips  General Tips   Eat at least three times per day.   Pay attention to your body. When you feel like you have had enough to eat, stop. Quit before you feel full, stuffed, or sick from eating. You can have more if you are really hungry.   If you still feel hungry or unsatisfied after a meal or snack, wait at least 10 minutes before you have more food. Often, the craving will go away.   Drink plenty of calorie-free drinks (water, tea, coffee, diet soda). You may be thirsty, not hungry.   Pick lean meats, low-fat or nonfat cheese, and skim (nonfat) or 1% fat milk instead of higher-fat/higher-calorie choices.   Get plenty of fiber. Vegetables, fruits, and whole grains are good sources. Have a highfiber cereal every day.   Cut back on sugar. For example, drink less fruit juice and regular soda.   Limit the amount of alcohol (beer, wine, and liquor) that you drink.   Keep all food in the kitchen. Eat only in a chosen place, such as at the table. Dont eat in the car or the bedroom or in front of the TV.   Food Preparation   Plan meals ahead of time.   Try cooking methods that cut calories: o Cook without adding fat (bake, broil, roast, boil). o Use nonstick cooking sprays instead of butter or oil. You can also use wine, broth, or fruit juice instead of oil when cooking. o Use low-calorie foods instead of high-calorie ones when possible.   Meghan SimasCook only what you need for one meal (dont make leftovers).    If you do make extra portions, put them away as soon as they are ready so you can save them for other meals. Store the leftovers in containers that you cant see through.   Cook when you are not hungry. For example, cook and refrigerate tomorrows dinner after you have finished eating tonight.  Make fruits, vegetables, and other low-calorie foods part of each meal.   Drink water while you cook.    Mealtimes   Drink a glass of water before you eat. Drink more during meals.     Use smaller plates, bowls, glasses, and serving spoons.   Divide your plate into four equal parts. Use one part for meat, one for starch (such as pasta, rice, potatoes, or bread), and two for nonstarchy vegetables.   Do not put serving dishes on the table. This will make it harder to take a second portion.   Put salad dressing on the side instead of mixing it with, or pouring onto your salad. Then dip your fork into the dressing before you spear a bite of salad.   Change your usual place at the table.   Make mealtime special by using pretty dishes, napkins, and glasses.   Eat slowly. Take a few one-minute breaks from eating during meals. Put your fork down between bites. Cut your food one bite at a time.   Enjoy fruit for dessert instead of cake, pie, or other sweets.   Leave a little food on your plate. (You control the food; it doesnt control you.)   Remove your plate as soon as youve finished eating.   If theres no good use for leftovers, throw them out!   Snacking   Snacking can be part of your plan for healthy weight loss. You can eat six times per day as long as you plan what to eat and dont eat too  many calories.   Plan ahead. Be sure to have healthy snacks on hand. If the right food is not there, you may be more likely to eat whatever is available, such as candy, cookies, chips, leftovers, or other quick choices.   Keep low-calorie snacks in a special part of the refrigerator. Good choices include the following: o Reduced-fat string cheese, low-calorie yogurt, and nonfat milk. o Washed, bite-size pieces of raw vegetables, such as carrots, celery, pepper strips, cucumbers, broccoli, and cauliflower. Serve with low-calorie dips. o Fresh fruit.    -From Academy of [employer] Dietetics

## 2018-09-28 NOTE — Discharge Summary (Signed)
Physician Discharge Summary  Meghan Pruitt PJA:[id number] DOB: December 30, 1980 DOA: 09/27/2018  PCP: Fleet Contras, MD  Admit date: 09/27/2018 Discharge date: 09/28/2018  Time spent: 45 minutes  Recommendations for Outpatient Follow-up:  Patient will be discharged to home.  Patient will need to follow up with primary care provider within one week of discharge. Follow up with Dr. [name], ENT, this week.  Patient should continue medications as prescribed.  Patient should follow a carb modified diet.   Discharge Diagnoses:  Active Problems:   Mastoiditis, acute   Otitis externa   Otitis media   Obesity, Class III, BMI 40-49.9 (morbid obesity) (HCC)   Discharge Condition: Stable  Diet recommendation: carb modified diet  Filed Weights   09/27/18 0953  Weight: 108.9 kg    History of present illness:  On 09/27/2018 by Dr. Calvert Cantor Meghan Pruitt is a 38 y.o. female with medical history of ruptured left tympanic membrane who began to have left ear pain yesterday followed by drainage. She has felt febrile and had some vomiting today. She has no other complaints.   Hospital Course:  Acute mastoiditis with otitis externa and media -CT temporal bones showed soft tissue diffuse thickening, of the left EAC and external ear consistent with otitis externa.  No abscess or osseous erosion to indicate malignant otitis externa.  Complete left mastoid and left middle ear opacification -Patient did present with leukocytosis which is now improved. Afebrile -Ear swelling is also improved -Dr. [name], ENT, consulted and appreciated, recommending outpatient follow-up with ciprofloxacin oral as well as drops  Obesity -Class III, BMI 43.9 -discussed diet with patient and the need for exercise -Patient did follow-up with a primary care physician and discuss lifestyle modifications including diet and exercise  Possible metabolic syndrome -Patient obese with hypertension, elevated glucose (see  hemoglobin A1c 6.1) -Patient again should follow-up with her primary care physician to discuss lifestyle modifications -patient should discuss use of metformin with her PCP  Procedures: None  Consultations: ENT, Dr. [name]  Discharge Exam: Vitals:   09/27/18 2110 09/28/18 0603  BP: 116/82 117/72  Pulse: 97 96  Resp: 18 18  Temp: 98.9 F (37.2 C) 98.5 F (36.9 C)  SpO2: 98% 98%     General: Well developed, well nourished, NAD, appears stated age  HEENT: NCAT, mucous membranes moist. Minimal edema left ear  Cardiovascular: S1 S2 auscultated, no rubs, murmurs or gallops. Regular rate and rhythm.  Respiratory: Clear to auscultation bilaterally with equal chest rise  Abdomen: Soft, obese, nontender, nondistended, + bowel sounds  Extremities: warm dry without cyanosis clubbing or edema  Neuro: AAOx3, nonfocal  Psych: Appropriate mood and affect  Discharge Instructions Discharge Instructions    Discharge instructions   Complete by:  As directed    Patient will be discharged to home.  Patient will need to follow up with primary care provider within one week of discharge. Follow up with Dr. [name], ENT, this week.  Patient should continue medications as prescribed.  Patient should follow a carb modified diet.     Allergies as of 09/28/2018   No Known Allergies     Medication List    STOP taking these medications   amoxicillin-clavulanate 875-125 MG tablet Commonly known as:  Augmentin   CitraNatal Harmony 27-1-260 MG Caps   etonogestrel-ethinyl estradiol 0.12-0.015 MG/24HR vaginal ring Commonly known as:  NUVARING   HYDROcodone-acetaminophen 5-325 MG tablet Commonly known as:  Norco   ibuprofen 800 MG tablet Commonly known as:  ADVIL  oxyCODONE-acetaminophen 10-325 MG tablet Commonly known as:  Percocet   sulfamethoxazole-trimethoprim 800-160 MG tablet Commonly known as:  BACTRIM DS     TAKE these medications   acetaminophen 500 MG tablet Commonly  known as:  TYLENOL Take 1,000 mg by mouth every 6 (six) hours as needed for mild pain, moderate pain, fever or headache.   albuterol 108 (90 Base) MCG/ACT inhaler Commonly known as:  VENTOLIN HFA Inhale 1-2 puffs into the lungs every 6 (six) hours as needed for wheezing or shortness of breath.   ciprofloxacin 500 MG tablet Commonly known as:  Cipro Take 1 tablet (500 mg total) by mouth 2 (two) times daily for 10 days.   ciprofloxacin-dexamethasone OTIC suspension Commonly known as:  CIPRODEX Place 4 drops into the left ear 2 (two) times daily.      No Known Allergies Follow-up Information    Christia ReadingBates, Dwight, MD. Schedule an appointment as soon as possible for a visit on 10/02/2018.   Specialty:  Otolaryngology Contact information: [address] [place] [address] [phone]        Fleet ContrasAvbuere, Edwin, MD. Schedule an appointment as soon as possible for a visit in 1 week(s).   Specialty:  Internal Medicine Why:  Hospital follow up Contact information: [address] [place] [address] [phone]            The results of significant diagnostics from this hospitalization (including imaging, microbiology, ancillary and laboratory) are listed below for reference.    Significant Diagnostic Studies: Ct Temporal Bones W Contrast  Result Date: 09/27/2018 CLINICAL DATA:  Concern for left malignant otitis externa. Hearing loss and pain. EXAM: CT TEMPORAL BONES WITH CONTRAST TECHNIQUE: Axial and coronal plane CT imaging of the petrous temporal bones was performed with thin-collimation image reconstruction after intravenous contrast administration. Multiplanar CT image reconstructions were also generated. CONTRAST:  75mL OMNIPAQUE IOHEXOL 300 MG/ML  SOLN COMPARISON:  Head CT 09/04/2013 FINDINGS: RIGHT: The external auditory canal is unremarkable. The ossicles appear intact. The tympanic cavity is clear. The internal auditory canal, cochlea, vestibule, and  semicircular canals are unremarkable. Small mastoid effusion without coalescence which appears chronic. LEFT: There is diffusely increased enhancement and thickening of the soft tissues of the external ear and external auditory canal with complete opacification of the EAC. No destructive changes are seen involving the bony EAC, and no abscess is identified in the surrounding soft tissues. The tympanic cavity is completely opacified with the malleus and incus appearing intact. The stapes is not well visualized. The scutum and tegmen appear intact. The mastoid air cells are completely opacified without coalescence. The internal auditory canal, cochlea, vestibule, and semicircular canals are unremarkable. There is minimal right maxillary and ethmoid sinus mucosal thickening. The visualized portion of the brain is unremarkable. Visualized orbits are unremarkable. Small soft tissue nodules in the left parotid gland including a partially visualized 10 mm short axis nodule, most likely reactive lymph nodes given adjacent inflammatory process. IMPRESSION: 1. Diffuse thickening of the soft tissues of the left EAC and external ear consistent with otitis externa. No abscess or osseous erosion to indicate malignant otitis externa. 2. Complete left mastoid and left middle ear opacification suggesting otomastoiditis, also without destructive osseous changes. 3. Small chronic right mastoid effusion. Electronically Signed   By: Sebastian AcheAllen  Grady M.D.   On: 09/27/2018 13:19    Microbiology: No results found for this or any previous visit (from the past 240 hour(s)).   Labs: Basic Metabolic Panel: Recent Labs  Lab 09/27/18 1059 09/27/18 1100 09/28/18 0340  NA 137  --  138  K 4.5  --  3.5  CL 103  --  104  CO2 25  --  25  GLUCOSE 119*  --  135*  BUN 10  --  9  CREATININE 0.90 0.98 0.98  CALCIUM 9.2  --  8.7*   Liver Function Tests: Recent Labs  Lab 09/27/18 1059  AST 24  ALT 20  ALKPHOS 82  BILITOT 1.2  PROT  7.8  ALBUMIN 4.0   No results for input(s): LIPASE, AMYLASE in the last 168 hours. No results for input(s): AMMONIA in the last 168 hours. CBC: Recent Labs  Lab 09/27/18 1059 09/28/18 0340  WBC 13.5* 11.3*  NEUTROABS 11.2*  --   HGB 14.3 13.2  HCT 44.1 42.5  MCV 96.1 98.2  PLT 249 224   Cardiac Enzymes: No results for input(s): CKTOTAL, CKMB, CKMBINDEX, TROPONINI in the last 168 hours. BNP: BNP (last 3 results) No results for input(s): BNP in the last 8760 hours.  ProBNP (last 3 results) No results for input(s): PROBNP in the last 8760 hours.  CBG: Recent Labs  Lab 09/27/18 1653 09/27/18 2143 09/28/18 0715  GLUCAP 110* 112* 178*       Signed:  Madden Piazza  [facility] 09/28/2018, 10:04 AM

## 2018-10-09 IMAGING — US US MFM FETAL BPP W/O NON-STRESS
1 series · 12 of 16 positions shown · non-contrast
Comparison: none

[Series 1: us mfm fetal bpp w/o non-stress · 16 acquisitions, 12 frames shown]
[im 1/16]
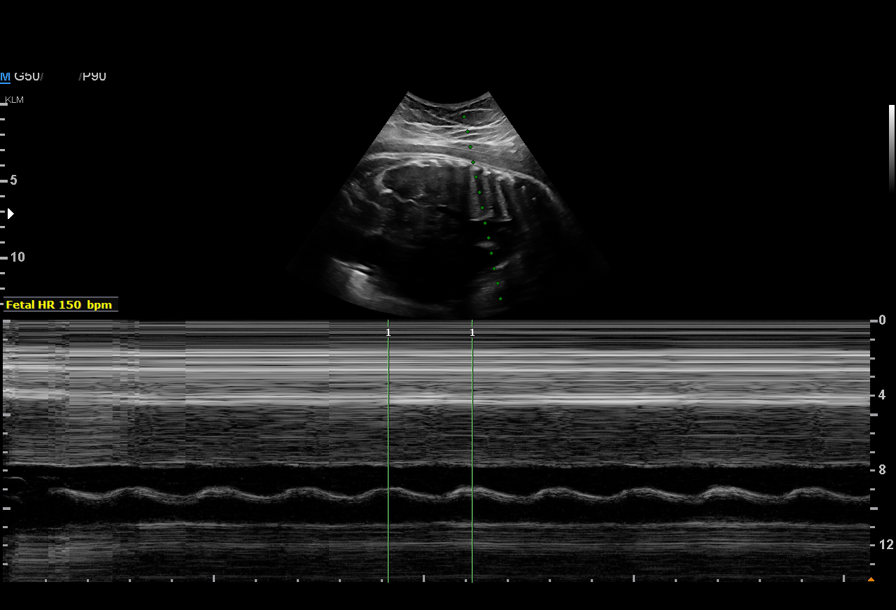
[im 3/16]
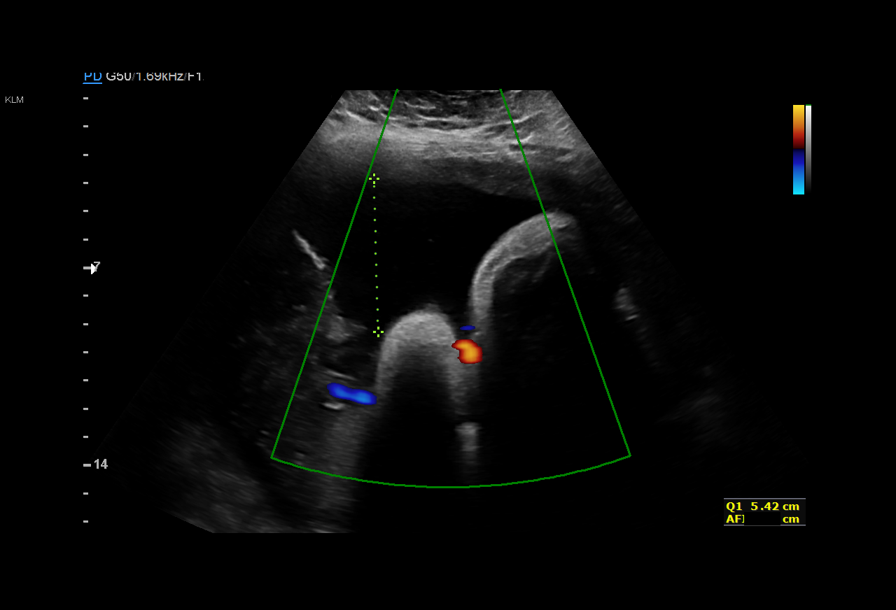
[im 4/16]
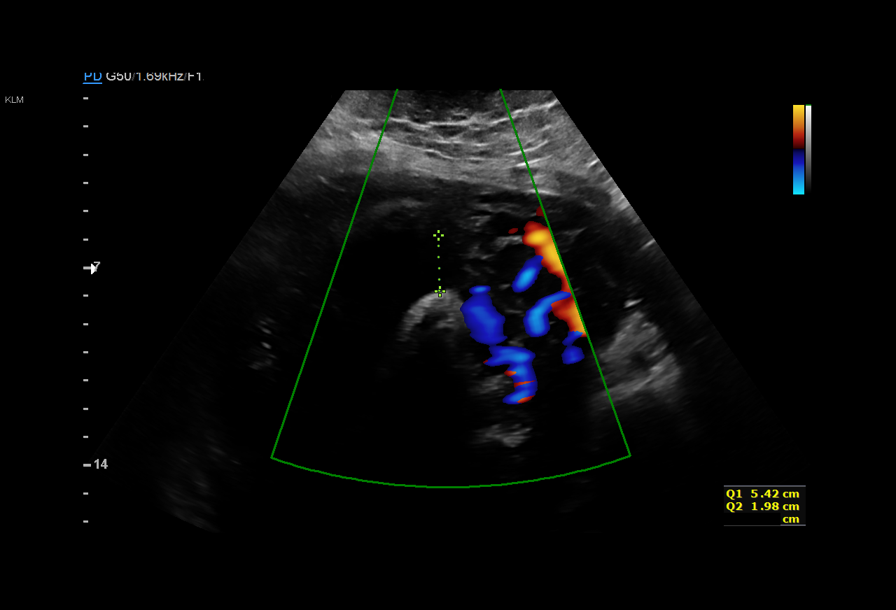
[im 5/16]
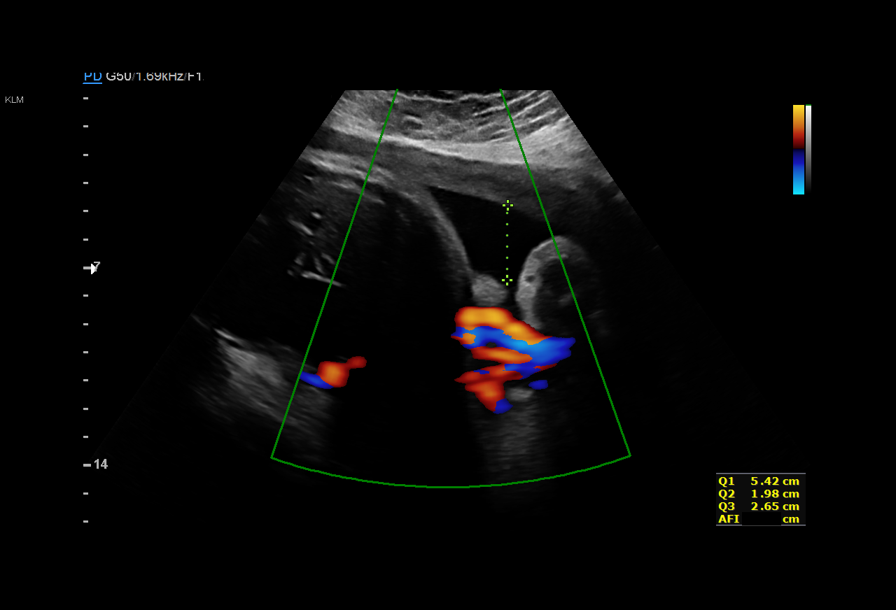
[im 7/16]
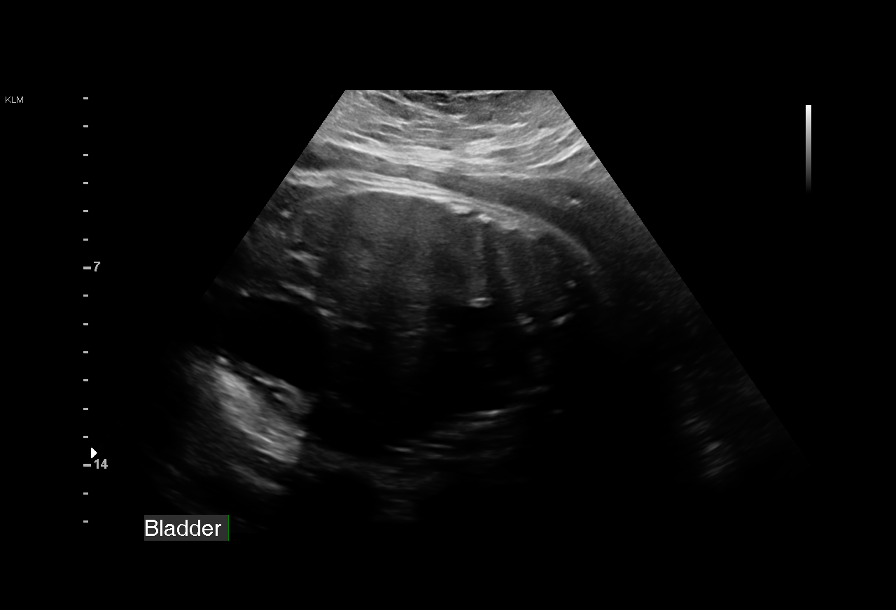
[im 8/16]
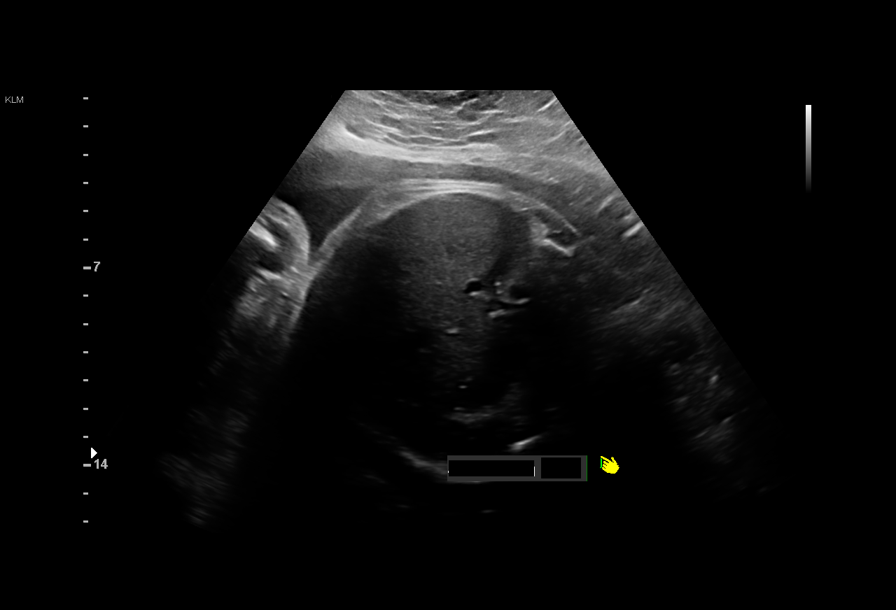
[im 9/16]
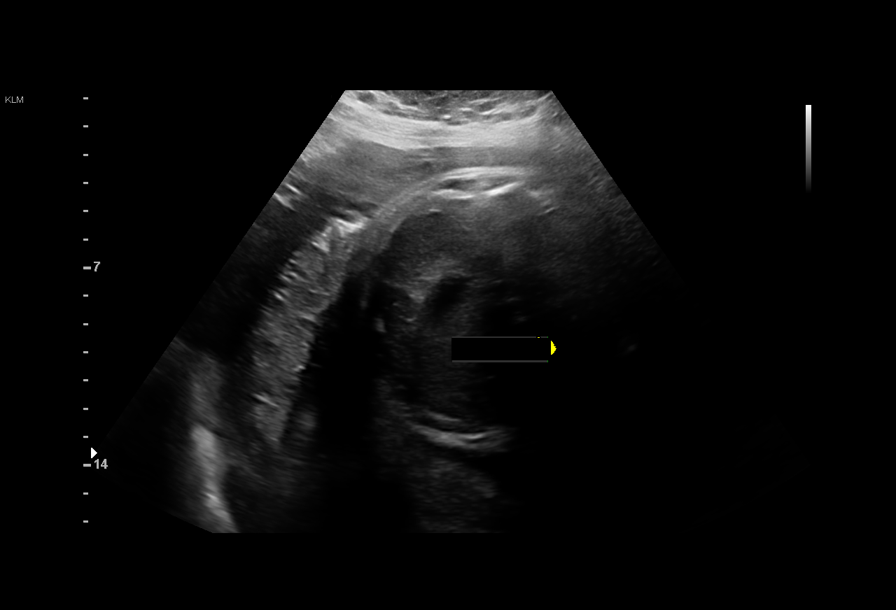
[im 11/16]
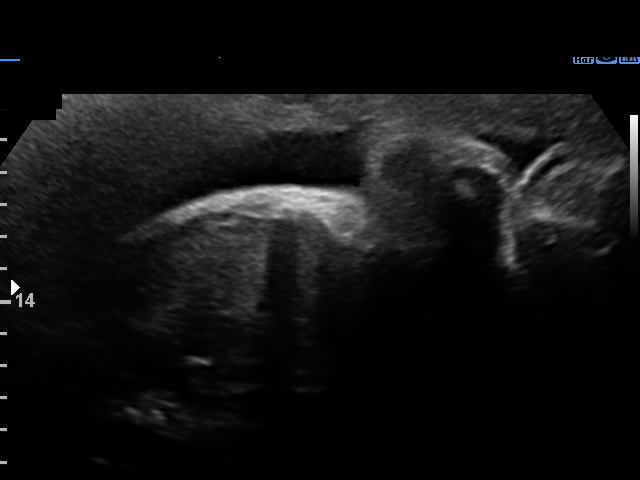
[im 12/16]
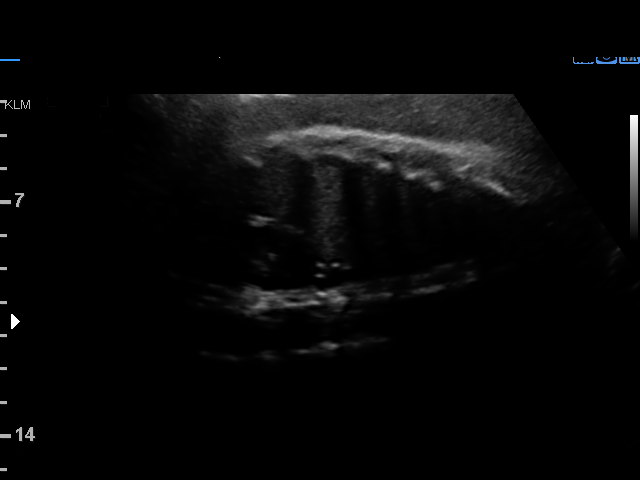
[im 13/16]
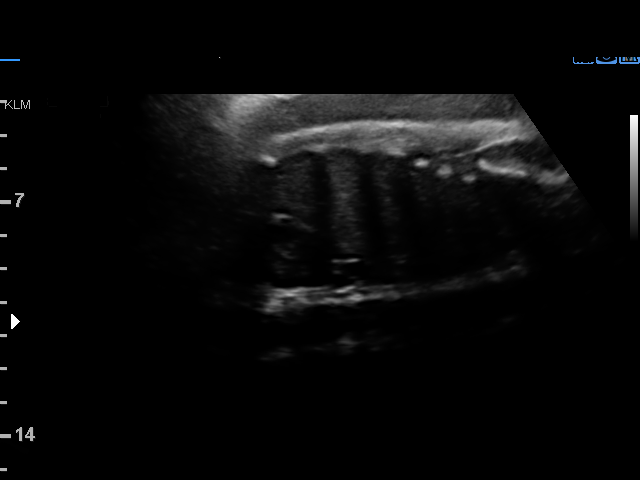
[im 15/16]
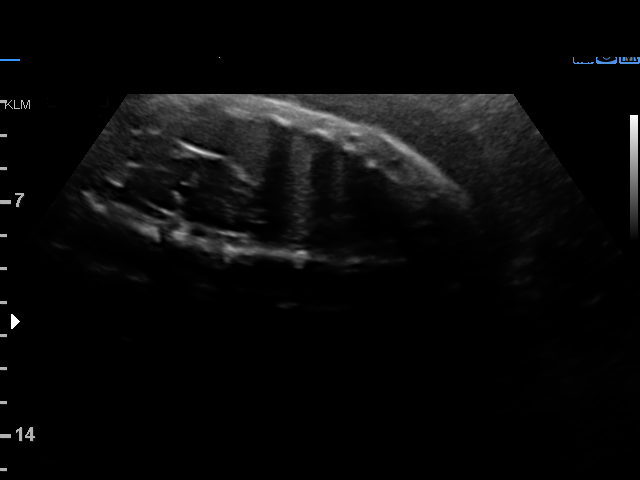
[im 16/16]
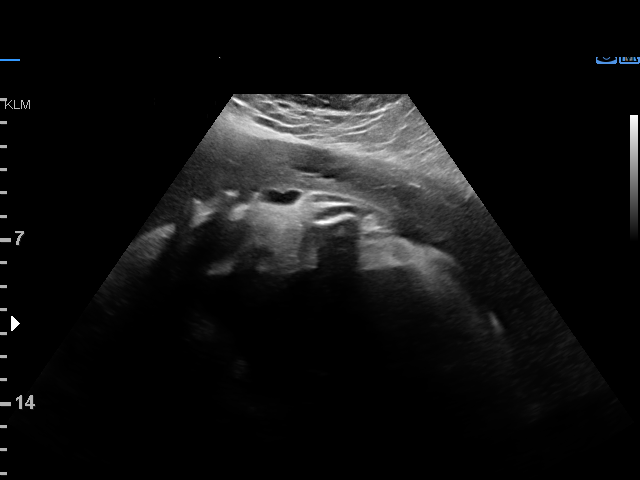

[12 of 16 positions shown; findings below may reference images not displayed]

Indications

40 weeks gestation of pregnancy
Postdate pregnancy (40-42 weeks)
Obesity complicating pregnancy, third
trimester
Late to prenatal care, third trimester
OB History

Gravidity:    3         Term:   2
Living:       2
Fetal Evaluation

Num Of Fetuses:     1
Fetal Heart         150
Rate(bpm):
Cardiac Activity:   Observed
Presentation:       Cephalic

Amniotic Fluid
AFI FV:      Subjectively within normal limits

AFI Sum(cm)     %Tile       Largest Pocket(cm)
11.61           48

RUQ(cm)       RLQ(cm)       LUQ(cm)        LLQ(cm)
5.42

Comment:    [DATE] BPP in 21 minutes.
Biophysical Evaluation
Amniotic F.V:   Within normal limits       F. Tone:        Observed
F. Movement:    Observed                   Score:          [DATE]
F. Breathing:   Observed
Gestational Age

LMP:           40w 5d       Date:   04/14/16                 EDD:   01/19/17
Best:          40w 5d    Det. By:   LMP  (04/14/16)          EDD:   01/19/17
Impression

IUP at  40+5 weeks
Normal amniotic fluid volume
BPP [DATE]
Recommendations

Follow-up ultrasounds as clinically indicated.

## 2020-06-11 ENCOUNTER — Other Ambulatory Visit: Payer: Self-pay

## 2020-06-11 ENCOUNTER — Encounter (HOSPITAL_COMMUNITY): Payer: Self-pay | Admitting: Emergency Medicine

## 2020-06-11 ENCOUNTER — Emergency Department (HOSPITAL_COMMUNITY)
Admission: EM | Admit: 2020-06-11 | Discharge: 2020-06-11 | Disposition: A | Discharge status: Home / Self Care | Payer: Medicaid Other | Attending: Emergency Medicine | Admitting: Emergency Medicine

## 2020-06-11 DIAGNOSIS — U071 COVID-19: Secondary | ICD-10-CM | POA: Diagnosis not present

## 2020-06-11 DIAGNOSIS — F17210 Nicotine dependence, cigarettes, uncomplicated: Secondary | ICD-10-CM | POA: Diagnosis not present

## 2020-06-11 DIAGNOSIS — R509 Fever, unspecified: Secondary | ICD-10-CM | POA: Diagnosis present

## 2020-06-11 LAB — RESP PANEL BY RT-PCR (FLU A&B, COVID) ARPGX2
Influenza A by PCR: NEGATIVE
Influenza B by PCR: NEGATIVE
SARS Coronavirus 2 by RT PCR: POSITIVE — AB

## 2020-06-11 MED ORDER — ONDANSETRON 4 MG PO TBDP: 4.0000 mg | ORAL_TABLET | Freq: Three times a day (TID) | ORAL | 0 refills | Status: DC | PRN

## 2020-06-11 MED ORDER — ACETAMINOPHEN 325 MG PO TABS
650.0000 mg | ORAL_TABLET | Freq: Once | ORAL | Status: AC | PRN
  Administered 2020-06-11: 650 mg via ORAL
  Filled 2020-06-11: qty 2

## 2020-06-11 MED ORDER — BENZONATATE 100 MG PO CAPS: 100.0000 mg | ORAL_CAPSULE | Freq: Three times a day (TID) | ORAL | 0 refills | Status: DC

## 2020-06-11 MED ORDER — KETOROLAC TROMETHAMINE 15 MG/ML IJ SOLN
15.0000 mg | Freq: Once | INTRAMUSCULAR | Status: AC
  Administered 2020-06-11: 15 mg via INTRAMUSCULAR
  Filled 2020-06-11: qty 1

## 2020-06-11 MED ORDER — PROMETHAZINE-DM 6.25-15 MG/5ML PO SYRP: 5.0000 mL | ORAL_SOLUTION | Freq: Four times a day (QID) | ORAL | 0 refills | Status: DC | PRN

## 2020-06-11 NOTE — Discharge Instructions (Signed)
You have covid, which is a viral illness.  This should be treated symptomatically. Use Tylenol or ibuprofen as needed for fevers, headaches, or body aches. Use zofran as needed for nausea and vomiting. Use cough drops and syrup as needed.  Use your inhaler every 4-6 hours as needed for wheezing, chest tightness, or coughing fits.  Make sure you stay well-hydrated with water. Wash your hands frequently to prevent spread of infection. Follow-up with your primary care doctor in 1 week if your symptoms are not improving. Return to the emergency room if you develop chest pain, difficulty breathing, or any new or worsening symptoms.

## 2020-06-11 NOTE — ED Provider Notes (Signed)
[facility]-EMERGENCY DEPT Provider Note   CSN: [id number] Arrival date & time: 06/11/20  0086     History Chief Complaint  Patient presents with  . Sore Throat  . Generalized Body Aches    Meghan Pruitt is a 40 y.o. female presenting for evaluation of fever, ha, st, cough, body aches.   Pt states her sxs began yesterday. She has ha and body aches. Associated cough productive of phlegm. Nausea with 1 episode of emesis. She has a h/o bronchitis, uses an inhaler as needed. She has not taken anything for her sxs. No cp. She is unvaccinated for covid. She denies sick contacts.   HPI     Past Medical History:  Diagnosis Date  . Abdominal pain in pregnancy, second trimester 09/14/2016  . Back pain during pregnancy in second trimester 09/14/2016  . Bacterial vaginosis 09/14/2016  . Chronic bronchitis (HCC)   . Hearing loss    left ear    Patient Active Problem List   Diagnosis Date Noted  . Mastoiditis, acute 09/27/2018  . Otitis externa 09/27/2018  . Otitis media 09/27/2018  . Obesity, Class III, BMI 40-49.9 (morbid obesity) (HCC) 09/27/2018  . Post term pregnancy at [redacted] weeks gestation 01/26/2017  . SVD (spontaneous vaginal delivery) 01/26/2017  . Breast abscess during pregnancy, antepartum 01/09/2017  . Supervision of other normal pregnancy, antepartum 11/25/2016  . Late prenatal care affecting pregnancy in third trimester 11/25/2016  . Bacterial vaginosis 09/14/2016    Past Surgical History:  Procedure Laterality Date  . BREAST BIOPSY Right 02/25/2014   abscess  . NO PAST SURGERIES       OB History    Gravida  3   Para  3   Term  3   Preterm      AB      Living  3     SAB      IAB      Ectopic      Multiple  0   Live Births  1           Family History  Problem Relation Age of Onset  . Heart disease Maternal Grandfather   . Heart disease Paternal Grandmother   . Breast cancer Paternal Aunt     Social History    Tobacco Use  . Smoking status: Current Some Day Smoker    Packs/day: 0.25    Years: 7.00    Pack years: 1.75    Types: Cigarettes  . Smokeless tobacco: Never Used  . Tobacco comment: only smokes about 1 cigarette a day - causes sickness  Substance Use Topics  . Alcohol use: No    Comment: occasionally  . Drug use: No    Home Medications Prior to Admission medications   Medication Sig Start Date End Date Taking? Authorizing Provider  benzonatate (TESSALON) 100 MG capsule Take 1 capsule (100 mg total) by mouth every 8 (eight) hours. 06/11/20  Yes Jaynie Hitch, PA-C  ondansetron (ZOFRAN ODT) 4 MG disintegrating tablet Take 1 tablet (4 mg total) by mouth every 8 (eight) hours as needed for nausea or vomiting. 06/11/20  Yes Pharrell Ledford, PA-C  promethazine-dextromethorphan (PROMETHAZINE-DM) 6.25-15 MG/5ML syrup Take 5 mLs by mouth 4 (four) times daily as needed for cough. 06/11/20  Yes Francile Woolford, PA-C  acetaminophen (TYLENOL) 500 MG tablet Take 1,000 mg by mouth every 6 (six) hours as needed for mild pain, moderate pain, fever or headache.    [provider]  albuterol (  PROVENTIL HFA;VENTOLIN HFA) 108 (90 Base) MCG/ACT inhaler Inhale 1-2 puffs into the lungs every 6 (six) hours as needed for wheezing or shortness of breath. 09/29/15   Clayton Bibles, PA-C  ciprofloxacin-dexamethasone (CIPRODEX) OTIC suspension Place 4 drops into the left ear 2 (two) times daily. 09/28/18   Cristal Ford, DO    Allergies    Patient has no known allergies.  Review of Systems   Review of Systems  Constitutional: Positive for fever.  Respiratory: Positive for cough, shortness of breath and wheezing.   Gastrointestinal: Positive for nausea and vomiting.  Musculoskeletal: Positive for myalgias.  Neurological: Positive for headaches.  All other systems reviewed and are negative.   Physical Exam Updated Vital Signs BP 123/76 (BP Location: Left Arm)   Pulse (!) 117   Temp (!) 103.1  F (39.5 C) (Oral)   Resp 19   Ht 5\' 3"  (1.6 m)   Wt 109 kg   SpO2 97%   BMI 42.57 kg/m   Physical Exam Vitals and nursing note reviewed.  Constitutional:      General: She is not in acute distress.    Appearance: She is well-developed and well-nourished. She is obese.     Comments: Appears as if she feels ill, otherwise not in distress  HENT:     Head: Normocephalic and atraumatic.  Eyes:     Extraocular Movements: Extraocular movements intact and EOM normal.     Conjunctiva/sclera: Conjunctivae normal.     Pupils: Pupils are equal, round, and reactive to light.  Cardiovascular:     Rate and Rhythm: Normal rate and regular rhythm.     Pulses: Normal pulses and intact distal pulses.  Pulmonary:     Effort: Pulmonary effort is normal. No respiratory distress.     Breath sounds: Wheezing present.     Comments: Scattered expiratory wheezing. Speaking in full sentences. SPO2 stable on RA. no desaturation when walking in the room Abdominal:     General: There is no distension.     Palpations: Abdomen is soft. There is no mass.     Tenderness: There is no abdominal tenderness. There is no guarding or rebound.  Musculoskeletal:        General: Normal range of motion.     Cervical back: Normal range of motion and neck supple.  Skin:    General: Skin is warm and dry.     Capillary Refill: Capillary refill takes less than 2 seconds.  Neurological:     Mental Status: She is alert and oriented to person, place, and time.  Psychiatric:        Mood and Affect: Mood and affect normal.     ED Results / Procedures / Treatments   Labs (all labs ordered are listed, but only abnormal results are displayed) Labs Reviewed  RESP PANEL BY RT-PCR (FLU A&B, COVID) ARPGX2 - Abnormal; Notable for the following components:      Result Value   SARS Coronavirus 2 by RT PCR POSITIVE (*)    All other components within normal limits    EKG None  Radiology No results  found.  Procedures Procedures (including critical care time)  Medications Ordered in ED Medications  acetaminophen (TYLENOL) tablet 650 mg (650 mg Oral Given 06/11/20 0949)  ketorolac (TORADOL) 15 MG/ML injection 15 mg (15 mg Intramuscular Given 06/11/20 1625)    ED Course  I have reviewed the triage vital signs and the nursing notes.  Pertinent labs & imaging results that were available  during my care of the patient were reviewed by me and considered in my medical decision making (see chart for details).    MDM Rules/Calculators/A&P                          Pt presenting with 1 day h/o covid sxs. On exam, pt appears nontoxic. pulm exam reassuring with scattered wheezing, likely due to bronchitis. Doubt secondary bacterial infection, as sxs began yesterday, I do not believe cxr will be beneficial. With reassuring O2, I do not feel she needs to be admitted. Due to risk factor, she qualifies for MAB tx, referral sent. Discussed symptomatic tx and quarantine instructions. F/u as needed. At this time, pt appears safe for d/c. Return precautions given. Pt states she understands and agrees to plan.   Final Clinical Impression(s) / ED Diagnoses Final diagnoses:  COVID-19    Rx / DC Orders ED Discharge Orders         Ordered    ondansetron (ZOFRAN ODT) 4 MG disintegrating tablet  Every 8 hours PRN        06/11/20 1621    benzonatate (TESSALON) 100 MG capsule  Every 8 hours        06/11/20 1621    promethazine-dextromethorphan (PROMETHAZINE-DM) 6.25-15 MG/5ML syrup  4 times daily PRN        06/11/20 1621           Antavion Bartoszek, PA-C 06/11/20 1628    Derwood Kaplan, MD 06/13/20 0028

## 2020-06-11 NOTE — ED Triage Notes (Signed)
Patient states she has sore throat, headache, emesis, body aches and shortness of breath since yesterday. Patient is not vaccinated against COVID-19.States hot and cold chills at home.

## 2020-09-22 ENCOUNTER — Emergency Department (HOSPITAL_COMMUNITY)
Admission: EM | Admit: 2020-09-22 | Discharge: 2020-09-22 | Disposition: A | Discharge status: Home / Self Care | Payer: Medicaid Other | Attending: Emergency Medicine | Admitting: Emergency Medicine

## 2020-09-22 ENCOUNTER — Emergency Department (HOSPITAL_COMMUNITY): Payer: Medicaid Other

## 2020-09-22 ENCOUNTER — Encounter (HOSPITAL_COMMUNITY): Payer: Self-pay

## 2020-09-22 ENCOUNTER — Other Ambulatory Visit: Payer: Self-pay

## 2020-09-22 DIAGNOSIS — N12 Tubulo-interstitial nephritis, not specified as acute or chronic: Secondary | ICD-10-CM | POA: Diagnosis not present

## 2020-09-22 DIAGNOSIS — R110 Nausea: Secondary | ICD-10-CM | POA: Diagnosis present

## 2020-09-22 DIAGNOSIS — F17210 Nicotine dependence, cigarettes, uncomplicated: Secondary | ICD-10-CM | POA: Insufficient documentation

## 2020-09-22 DIAGNOSIS — R059 Cough, unspecified: Secondary | ICD-10-CM | POA: Diagnosis not present

## 2020-09-22 DIAGNOSIS — R0602 Shortness of breath: Secondary | ICD-10-CM | POA: Insufficient documentation

## 2020-09-22 DIAGNOSIS — R0981 Nasal congestion: Secondary | ICD-10-CM | POA: Insufficient documentation

## 2020-09-22 DIAGNOSIS — M549 Dorsalgia, unspecified: Secondary | ICD-10-CM | POA: Diagnosis not present

## 2020-09-22 LAB — URINALYSIS, ROUTINE W REFLEX MICROSCOPIC
Bilirubin Urine: NEGATIVE
Glucose, UA: NEGATIVE mg/dL
Ketones, ur: NEGATIVE mg/dL
Nitrite: POSITIVE — AB
Protein, ur: NEGATIVE mg/dL
Specific Gravity, Urine: 1.015 (ref 1.005–1.030)
pH: 8 (ref 5.0–8.0)

## 2020-09-22 LAB — PREGNANCY, URINE: Preg Test, Ur: NEGATIVE

## 2020-09-22 MED ORDER — IBUPROFEN 800 MG PO TABS
800.0000 mg | ORAL_TABLET | Freq: Once | ORAL | Status: AC
  Administered 2020-09-22: 800 mg via ORAL
  Filled 2020-09-22: qty 1

## 2020-09-22 MED ORDER — OXYCODONE-ACETAMINOPHEN 5-325 MG PO TABS
1.0000 | ORAL_TABLET | Freq: Once | ORAL | Status: AC
  Administered 2020-09-22: 1 via ORAL
  Filled 2020-09-22: qty 1

## 2020-09-22 MED ORDER — METHOCARBAMOL 500 MG PO TABS
500.0000 mg | ORAL_TABLET | Freq: Once | ORAL | Status: AC
  Administered 2020-09-22: 500 mg via ORAL
  Filled 2020-09-22: qty 1

## 2020-09-22 MED ORDER — SODIUM CHLORIDE 0.9 % IV SOLN
1.0000 g | Freq: Once | INTRAVENOUS | Status: AC
  Administered 2020-09-22: 1 g via INTRAVENOUS
  Filled 2020-09-22: qty 10

## 2020-09-22 MED ORDER — CEFPODOXIME PROXETIL 200 MG PO TABS: 200.0000 mg | ORAL_TABLET | Freq: Two times a day (BID) | ORAL | 0 refills | Status: DC

## 2020-09-22 MED ORDER — CEPHALEXIN 500 MG PO CAPS: 500.0000 mg | ORAL_CAPSULE | Freq: Two times a day (BID) | ORAL | 0 refills | Status: DC

## 2020-09-22 MED ORDER — NAPROXEN 500 MG PO TABS: 500.0000 mg | ORAL_TABLET | Freq: Two times a day (BID) | ORAL | 0 refills | Status: DC

## 2020-09-22 MED ORDER — CEPHALEXIN 500 MG PO CAPS: 500.0000 mg | ORAL_CAPSULE | Freq: Two times a day (BID) | ORAL | 0 refills | Status: AC

## 2020-09-22 MED ORDER — METHOCARBAMOL 500 MG PO TABS: 500.0000 mg | ORAL_TABLET | Freq: Two times a day (BID) | ORAL | 0 refills | Status: DC

## 2020-09-22 NOTE — ED Triage Notes (Signed)
Patient c/o right mid back pain since yesterday. patient denies any injury or heavy lifting. patient states it is worse with movement or trying to lift anything.

## 2020-09-22 NOTE — Discharge Instructions (Signed)
Take the antibiotics as directed. Follow-up with your primary care provider. Take the pain medicine as needed to help with your symptoms. Return to the ER if you start to experience worsening back pain, fever, vomiting or chest pain.

## 2020-09-22 NOTE — ED Provider Notes (Signed)
Meghan Pruitt   CSN: [id number] Arrival date & time: 09/22/20  1422     History Chief Complaint  Patient presents with  . Back Pain  . Shortness of Breath  . Cough    Meghan Pruitt is a 40 y.o. female with a past medical history of chronic bronchitis presenting to the ED with a chief complaint of right-sided back pain, shortness of breath, cough, congestion and nausea.  Symptoms began yesterday.  States that she woke up with some right-sided back pain.  Has been having a cough productive with "cold."  States that the back pain is worse with any type of movement and with coughing.  She tried taking Tylenol and NSAIDs with some improvement but continues to have pain when the medicine wears off.  Denies any chest pain, vomiting, fever.  She does report some intermittent dysuria and would like to be checked for UTI.  Denies any abdominal pain or diarrhea.  HPI     Past Medical History:  Diagnosis Date  . Abdominal pain in pregnancy, second trimester 09/14/2016  . Back pain during pregnancy in second trimester 09/14/2016  . Bacterial vaginosis 09/14/2016  . Chronic bronchitis (HCC)   . Hearing loss    left ear    Patient Active Problem List   Diagnosis Date Noted  . Mastoiditis, acute 09/27/2018  . Otitis externa 09/27/2018  . Otitis media 09/27/2018  . Obesity, Class III, BMI 40-49.9 (morbid obesity) (HCC) 09/27/2018  . Post term pregnancy at [redacted] weeks gestation 01/26/2017  . SVD (spontaneous vaginal delivery) 01/26/2017  . Breast abscess during pregnancy, antepartum 01/09/2017  . Supervision of other normal pregnancy, antepartum 11/25/2016  . Late prenatal care affecting pregnancy in third trimester 11/25/2016  . Bacterial vaginosis 09/14/2016    Past Surgical History:  Procedure Laterality Date  . BREAST BIOPSY Right 02/25/2014   abscess  . NO PAST SURGERIES       OB History    Gravida  3   Para  3   Term  3   Preterm       AB      Living  3     SAB      IAB      Ectopic      Multiple  0   Live Births  1           Family History  Problem Relation Age of Onset  . Heart disease Maternal Grandfather   . Heart disease Paternal Grandmother   . Breast cancer Paternal Aunt     Social History   Tobacco Use  . Smoking status: Current Every Day Smoker    Packs/day: 0.45    Years: 7.00    Pack years: 3.15    Types: Cigarettes  . Smokeless tobacco: Never Used  Vaping Use  . Vaping Use: Never used  Substance Use Topics  . Alcohol use: No  . Drug use: No    Home Medications Prior to Admission medications   Medication Sig Start Date End Date Taking? Authorizing Provider  cefpodoxime (VANTIN) 200 MG tablet Take 1 tablet (200 mg total) by mouth 2 (two) times daily for 10 days. 09/22/20 10/02/20 Yes Makeda Peeks, PA-C  methocarbamol (ROBAXIN) 500 MG tablet Take 1 tablet (500 mg total) by mouth 2 (two) times daily. 09/22/20  Yes Haide Klinker, PA-C  naproxen (NAPROSYN) 500 MG tablet Take 1 tablet (500 mg total) by mouth 2 (two) times daily. 09/22/20  Yes Jader Desai, PA-C  acetaminophen (TYLENOL) 500 MG tablet Take 1,000 mg by mouth every 6 (six) hours as needed for mild pain, moderate pain, fever or headache.    [provider]  albuterol (PROVENTIL HFA;VENTOLIN HFA) 108 (90 Base) MCG/ACT inhaler Inhale 1-2 puffs into the lungs every 6 (six) hours as needed for wheezing or shortness of breath. 09/29/15   Shelva Majestic, Emily, PA-C  benzonatate (TESSALON) 100 MG capsule Take 1 capsule (100 mg total) by mouth every 8 (eight) hours. 06/11/20   Caccavale, Sophia, PA-C  ciprofloxacin-dexamethasone (CIPRODEX) OTIC suspension Place 4 drops into the left ear 2 (two) times daily. 09/28/18   Mikhail, Nita Sells, DO  ondansetron (ZOFRAN ODT) 4 MG disintegrating tablet Take 1 tablet (4 mg total) by mouth every 8 (eight) hours as needed for nausea or vomiting. 06/11/20   Caccavale, Sophia, PA-C   promethazine-dextromethorphan (PROMETHAZINE-DM) 6.25-15 MG/5ML syrup Take 5 mLs by mouth 4 (four) times daily as needed for cough. 06/11/20   Caccavale, Sophia, PA-C    Allergies    Patient has no known allergies.  Review of Systems   Review of Systems  Constitutional: Negative for appetite change, chills and fever.  HENT: Positive for congestion. Negative for ear pain, rhinorrhea, sneezing and sore throat.   Eyes: Negative for photophobia and visual disturbance.  Respiratory: Positive for cough and shortness of breath. Negative for chest tightness and wheezing.   Cardiovascular: Negative for chest pain and palpitations.  Gastrointestinal: Positive for nausea. Negative for abdominal pain, blood in stool, constipation, diarrhea and vomiting.  Genitourinary: Positive for dysuria. Negative for hematuria and urgency.  Musculoskeletal: Positive for myalgias.  Skin: Negative for rash.  Neurological: Negative for dizziness, weakness and light-headedness.    Physical Exam Updated Vital Signs BP (!) 143/94 (BP Location: Right Arm)   Pulse 93   Temp 99.6 F (37.6 C)   Resp 18   Ht 5\' 2"  (1.575 m)   Wt 114.5 kg   LMP 09/15/2020   SpO2 98%   BMI 46.18 kg/m   Physical Exam Vitals and nursing Pruitt reviewed.  Constitutional:      General: She is not in acute distress.    Appearance: She is well-developed. She is obese.     Comments: Speaking complete sentences without difficulty.  No signs of respiratory distress  HENT:     Head: Normocephalic and atraumatic.     Nose: Nose normal.  Eyes:     General: No scleral icterus.       Right eye: No discharge.        Left eye: No discharge.     Conjunctiva/sclera: Conjunctivae normal.  Cardiovascular:     Rate and Rhythm: Normal rate and regular rhythm.     Heart sounds: Normal heart sounds. No murmur heard. No friction rub. No gallop.   Pulmonary:     Effort: Pulmonary effort is normal. No respiratory distress.     Breath sounds:  Normal breath sounds.  Abdominal:     General: Bowel sounds are normal. There is no distension.     Palpations: Abdomen is soft.     Tenderness: There is no abdominal tenderness. There is right CVA tenderness. There is no guarding.  Musculoskeletal:        General: Normal range of motion.     Cervical back: Normal range of motion and neck supple.  Skin:    General: Skin is warm and dry.     Findings: No rash.  Neurological:  Mental Status: She is alert.     Motor: No abnormal muscle tone.     Coordination: Coordination normal.     ED Results / Procedures / Treatments   Labs (all labs ordered are listed, but only abnormal results are displayed) Labs Reviewed  URINALYSIS, ROUTINE W REFLEX MICROSCOPIC - Abnormal; Notable for the following components:      Result Value   Hgb urine dipstick SMALL (*)    Nitrite POSITIVE (*)    Leukocytes,Ua SMALL (*)    Bacteria, UA MANY (*)    All other components within normal limits  URINE CULTURE  PREGNANCY, URINE    EKG None  Radiology DG Chest 2 View  Result Date: 09/22/2020 CLINICAL DATA:  Right mid back pain since yesterday, cough and shortness of breath EXAM: CHEST - 2 VIEW COMPARISON:  09/29/2015 FINDINGS: The heart size and mediastinal contours are within normal limits. Both lungs are clear. The visualized skeletal structures are unremarkable. IMPRESSION: No active cardiopulmonary disease. Electronically Signed   By: Sharlet SalinaMichael  Brown M.D.   On: 09/22/2020 15:42    Procedures Procedures   Medications Ordered in ED Medications  oxyCODONE-acetaminophen (PERCOCET/ROXICET) 5-325 MG per tablet 1 tablet (1 tablet Oral Given 09/22/20 1645)  methocarbamol (ROBAXIN) tablet 500 mg (500 mg Oral Given 09/22/20 1644)  ibuprofen (ADVIL) tablet 800 mg (800 mg Oral Given 09/22/20 1645)  cefTRIAXone (ROCEPHIN) 1 g in sodium chloride 0.9 % 100 mL IVPB (1 g Intravenous New Bag/Given 09/22/20 1718)    ED Course  I have reviewed the triage vital  signs and the nursing notes.  Pertinent labs & imaging results that were available during my care of the patient were reviewed by me and considered in my medical decision making (see chart for details).  Clinical Course as of 09/22/20 1800  Fri Sep 22, 2020  1606 Pulse Rate: 93 [HK]  1606 Nitrite(!): POSITIVE [HK]  1649 Glori LuisLeukocytes,Ua(!): SMALL [HK]  1649 Bacteria, UA(!): MANY [HK]  1649 Preg Test, Ur: NEGATIVE [HK]    Clinical Course User Index [HK] Dietrich PatesKhatri, Bates Collington, PA-C   MDM Rules/Calculators/A&P                          40 year old female with past medical history of chronic bronchitis presenting to the ED with a chief complaint of right-sided back pain, shortness of breath, cough, congestion and nausea.  Symptoms began yesterday.  Reports some intermittent dysuria as well.  No chest pain, vomiting or fever.  On exam there is right CVA tenderness noted.  Abdomen is soft.  Lungs were clear to auscultation bilaterally.  No signs of respiratory distress.  Initially tachycardic on arrival but this improved spontaneously.  No midline tenderness of the back noted.  Chest x-ray without any acute findings.  EKG shows sinus rhythm, no ischemic changes, no STEMI, no changes from prior tracings.  Urinalysis with signs of infection including nitrite, leukocytes and many bacteria.  Pregnancy test is negative.  I suspect that her symptoms could be due to pyelonephritis.  Will treat with antibiotics.  She could have a viral URI as well.  Will treat symptomatically.  Vital signs are reassuring.  Return precautions given.   Patient is hemodynamically stable, in NAD, and able to ambulate in the ED. Evaluation does not show pathology that would require ongoing emergent intervention or inpatient treatment. I explained the diagnosis to the patient. Pain has been managed and has no complaints prior to discharge. Patient is  comfortable with above plan and is stable for discharge at this time. All questions were  answered prior to disposition. Strict return precautions for returning to the ED were discussed. Encouraged follow up with PCP.   An After Visit Summary was printed and given to the patient.   Portions of this Pruitt were generated with Scientist, clinical (histocompatibility and immunogenetics). Dictation errors may occur despite best attempts at proofreading.  Final Clinical Impression(s) / ED Diagnoses Final diagnoses:  Pyelonephritis    Rx / DC Orders ED Discharge Orders         Ordered    cefpodoxime (VANTIN) 200 MG tablet  2 times daily        09/22/20 1729    naproxen (NAPROSYN) 500 MG tablet  2 times daily        09/22/20 1729    methocarbamol (ROBAXIN) 500 MG tablet  2 times daily        09/22/20 1729           Dietrich Pates, PA-C 09/22/20 1800    Terrilee Files, MD 09/23/20 [phone]

## 2020-09-25 LAB — URINE CULTURE: Culture: >=100000 — AB

## 2020-09-26 ENCOUNTER — Telehealth: Payer: Self-pay | Admitting: Emergency Medicine

## 2020-09-26 NOTE — Telephone Encounter (Signed)
Post ED Visit - Positive Culture Follow-up  Culture report reviewed by antimicrobial stewardship pharmacist: [facility] []  [address], Pharm.D. []  [place], Pharm.D., BCPS AQ-ID []  , Pharm.D., BCPS []  Celedonio Miyamoto, Pharm.D., BCPS []  [place], Garvin Fila.D., BCPS, AAHIVP []  , Pharm.D., BCPS, AAHIVP []  Georgina Pillion, PharmD, BCPS []  , PharmD, BCPS []  [place], PharmD, BCPS []  [address], PharmD []  , PharmD, BCPS []  Estella Husk, PharmD  Pharmacy Team []  Lysle Pearl, PharmD []  , PharmD []  Phillips Climes, PharmD []  , Rph []  Agapito Games) , PharmD []  Verlan Friends, PharmD []  , PharmD []  Mervyn Gay, PharmD []  , PharmD []  Vinnie Level, PharmD []  [facility], PharmD []  , PharmD []  Len Childs, PharmD   Positive urine culture Treated with cefdopoxime and cephalexin, organism sensitive to the same and no further patient follow-up is required at this time.  09/26/2020, 10:22 AM

## 2020-12-29 ENCOUNTER — Other Ambulatory Visit: Payer: Self-pay

## 2020-12-29 ENCOUNTER — Ambulatory Visit (HOSPITAL_COMMUNITY)
Admission: EM | Admit: 2020-12-29 | Discharge: 2020-12-29 | Disposition: A | Discharge status: Home / Self Care | Payer: Medicaid Other | Attending: Internal Medicine | Admitting: Internal Medicine

## 2020-12-29 ENCOUNTER — Encounter (HOSPITAL_COMMUNITY): Payer: Self-pay | Admitting: *Deleted

## 2020-12-29 DIAGNOSIS — H60332 Swimmer's ear, left ear: Secondary | ICD-10-CM

## 2020-12-29 DIAGNOSIS — R030 Elevated blood-pressure reading, without diagnosis of hypertension: Secondary | ICD-10-CM

## 2020-12-29 DIAGNOSIS — H9202 Otalgia, left ear: Secondary | ICD-10-CM

## 2020-12-29 MED ORDER — IBUPROFEN 800 MG PO TABS
800.0000 mg | ORAL_TABLET | Freq: Once | ORAL | Status: AC
  Administered 2020-12-29: 800 mg via ORAL

## 2020-12-29 MED ORDER — ACETAMINOPHEN 325 MG PO TABS
ORAL_TABLET | ORAL | Status: AC
  Filled 2020-12-29: qty 2

## 2020-12-29 MED ORDER — ACETAMINOPHEN 325 MG PO TABS
650.0000 mg | ORAL_TABLET | Freq: Once | ORAL | Status: AC
  Administered 2020-12-29: 650 mg via ORAL

## 2020-12-29 MED ORDER — AMOXICILLIN-POT CLAVULANATE 875-125 MG PO TABS
1.0000 | ORAL_TABLET | Freq: Two times a day (BID) | ORAL | 0 refills | Status: DC
Start: 2020-12-29 — End: 2023-06-30

## 2020-12-29 MED ORDER — NEOMYCIN-POLYMYXIN-HC 3.5-10000-1 OT SUSP: 4.0000 [drp] | Freq: Four times a day (QID) | OTIC | 0 refills | Status: AC

## 2020-12-29 MED ORDER — IBUPROFEN 800 MG PO TABS
ORAL_TABLET | ORAL | Status: AC
  Filled 2020-12-29: qty 1

## 2020-12-29 NOTE — ED Provider Notes (Signed)
[facility]    CSN: [id number] Arrival date & time: 12/29/20  1218      History   Chief Complaint No chief complaint on file.   HPI Meghan Pruitt is a 40 y.o. female.   40 year old female patient presents to urgent care for plaint of left ear pain this a.m. patient states she has been swimming recently.  The history is provided by the patient. No language interpreter was used.   Past Medical History:  Diagnosis Date   Abdominal pain in pregnancy, second trimester 09/14/2016   Back pain during pregnancy in second trimester 09/14/2016   Bacterial vaginosis 09/14/2016   Chronic bronchitis (HCC)    Hearing loss    left ear    Patient Active Problem List   Diagnosis Date Noted   Otalgia of left ear 12/29/2020   Mastoiditis, acute 09/27/2018   Otitis externa 09/27/2018   Otitis media 09/27/2018   Obesity, Class III, BMI 40-49.9 (morbid obesity) (HCC) 09/27/2018   Post term pregnancy at [redacted] weeks gestation 01/26/2017   SVD (spontaneous vaginal delivery) 01/26/2017   Breast abscess during pregnancy, antepartum 01/09/2017   Supervision of other normal pregnancy, antepartum 11/25/2016   Late prenatal care affecting pregnancy in third trimester 11/25/2016   Bacterial vaginosis 09/14/2016    Past Surgical History:  Procedure Laterality Date   BREAST BIOPSY Right 02/25/2014   abscess    OB History     Gravida  3   Para  3   Term  3   Preterm      AB      Living  3      SAB      IAB      Ectopic      Multiple  0   Live Births  1            Home Medications    Prior to Admission medications   Medication Sig Start Date End Date Taking? Authorizing Provider  albuterol (PROVENTIL HFA;VENTOLIN HFA) 108 (90 Base) MCG/ACT inhaler Inhale 1-2 puffs into the lungs every 6 (six) hours as needed for wheezing or shortness of breath. 09/29/15  Yes West, Emily, PA-C  amoxicillin-clavulanate (AUGMENTIN) 875-125 MG tablet Take 1 tablet by mouth every 12  (twelve) hours. 12/29/20  Yes Cena Bruhn, Para March, NP  neomycin-polymyxin-hydrocortisone (CORTISPORIN) 3.5-10000-1 OTIC suspension Place 4 drops into the left ear 4 (four) times daily for 7 days. 12/29/20 01/05/21 Yes Bethann Qualley, Para March, NP  acetaminophen (TYLENOL) 500 MG tablet Take 1,000 mg by mouth every 6 (six) hours as needed for mild pain, moderate pain, fever or headache.    [provider]  benzonatate (TESSALON) 100 MG capsule Take 1 capsule (100 mg total) by mouth every 8 (eight) hours. 06/11/20   Caccavale, Sophia, PA-C  methocarbamol (ROBAXIN) 500 MG tablet Take 1 tablet (500 mg total) by mouth 2 (two) times daily. 09/22/20   Khatri, Hina, PA-C  naproxen (NAPROSYN) 500 MG tablet Take 1 tablet (500 mg total) by mouth 2 (two) times daily. 09/22/20   Khatri, Hina, PA-C  ondansetron (ZOFRAN ODT) 4 MG disintegrating tablet Take 1 tablet (4 mg total) by mouth every 8 (eight) hours as needed for nausea or vomiting. 06/11/20   Caccavale, Sophia, PA-C  promethazine-dextromethorphan (PROMETHAZINE-DM) 6.25-15 MG/5ML syrup Take 5 mLs by mouth 4 (four) times daily as needed for cough. 06/11/20   Caccavale, Sophia, PA-C    Family History Family History  Problem Relation Age of Onset   Healthy Mother  Healthy Father    Heart disease Maternal Grandfather    Heart disease Paternal Grandmother    Breast cancer Paternal Aunt     Social History Social History   Tobacco Use   Smoking status: Every Day    Packs/day: 0.45    Years: 7.00    Pack years: 3.15    Types: Cigarettes   Smokeless tobacco: Never  Vaping Use   Vaping Use: Never used  Substance Use Topics   Alcohol use: Yes    Comment: occasionally   Drug use: No     Allergies   Patient has no known allergies.   Review of Systems Review of Systems  Constitutional:  Negative for fever.  HENT:  Positive for ear discharge and ear pain.   All other systems reviewed and are negative.   Physical Exam Triage Vital Signs ED  Triage Vitals  Enc Vitals Group     BP 12/29/20 1232 (!) 139/100     Pulse Rate 12/29/20 1232 (!) 106     Resp 12/29/20 1232 18     Temp 12/29/20 1232 98.9 F (37.2 C)     Temp Source 12/29/20 1232 Oral     SpO2 12/29/20 1232 100 %     Weight --      Height --      Head Circumference --      Peak Flow --      Pain Score 12/29/20 1237 10     Pain Loc --      Pain Edu? --      Excl. in GC? --    No data found.  Updated Vital Signs BP (!) 139/100   Pulse (!) 106   Temp 98.9 F (37.2 C) (Oral)   Resp 18   LMP 12/01/2020 (Approximate)   SpO2 100%   Breastfeeding No   Visual Acuity Right Eye Distance:   Left Eye Distance:   Bilateral Distance:    Right Eye Near:   Left Eye Near:    Bilateral Near:     Physical Exam Vitals and nursing note reviewed.  Constitutional:      General: She is not in acute distress.    Appearance: She is well-developed.  HENT:     Head: Normocephalic and atraumatic.     Left Ear: Swelling present. Tympanic membrane is erythematous.     Ears:     Comments: Ear canal swelling, no mastoid erythema or tenderness, no trismus    Nose: Congestion present.     Mouth/Throat:     Lips: Pink.     Mouth: Mucous membranes are moist.     Pharynx: Oropharynx is clear.  Eyes:     General: Lids are normal.     Conjunctiva/sclera: Conjunctivae normal.     Pupils: Pupils are equal, round, and reactive to light.  Cardiovascular:     Rate and Rhythm: Normal rate and regular rhythm.     Pulses: Normal pulses.     Heart sounds: No murmur heard. Pulmonary:     Effort: Pulmonary effort is normal. No respiratory distress.     Breath sounds: Normal breath sounds and air entry.  Abdominal:     Palpations: Abdomen is soft.     Tenderness: There is no abdominal tenderness.  Musculoskeletal:     Cervical back: Normal range of motion and neck supple.  Skin:    General: Skin is warm and dry.  Neurological:     Mental Status: She is alert and  oriented to  person, place, and time.     GCS: GCS eye subscore is 4. GCS verbal subscore is 5. GCS motor subscore is 6.     UC Treatments / Results  Labs (all labs ordered are listed, but only abnormal results are displayed) Labs Reviewed - No data to display  EKG   Radiology No results found.  Procedures Procedures (including critical care time)  Medications Ordered in UC Medications  acetaminophen (TYLENOL) tablet 650 mg (650 mg Oral Given 12/29/20 1236)  ibuprofen (ADVIL) tablet 800 mg (800 mg Oral Given 12/29/20 1348)    Initial Impression / Assessment and Plan / UC Course  I have reviewed the triage vital signs and the nursing notes.  Pertinent labs & imaging results that were available during my care of the patient were reviewed by me and considered in my medical decision making (see chart for details).     Ddx: Otitis externa, otitis media, mastoiditis, cellulitis Final Clinical Impressions(s) / UC Diagnoses   Final diagnoses:  Acute swimmer's ear of left side  Otalgia of left ear  Elevated blood pressure reading     Discharge Instructions      Do not put any water in your ears do not go swimming.  Do not stick Q-tips or other objects in the ear unless directed by provider.  Use drops and antibiotic as prescribed.  Follow-up with ENT if symptoms persist.  Have your blood pressure rechecked at your PCPs office it was elevated today in the urgent care, may be due to pain.     ED Prescriptions     Medication Sig Dispense Auth. Provider   neomycin-polymyxin-hydrocortisone (CORTISPORIN) 3.5-10000-1 OTIC suspension Place 4 drops into the left ear 4 (four) times daily for 7 days. 5.6 mL Christl Fessenden, NP   amoxicillin-clavulanate (AUGMENTIN) 875-125 MG tablet Take 1 tablet by mouth every 12 (twelve) hours. 14 tablet Tavaughn Silguero, Para March, NP      PDMP not reviewed this encounter.   Clancy Gourd, NP 12/29/20 1925

## 2020-12-29 NOTE — ED Triage Notes (Signed)
C/O waking this AM with severe left ear pain and drainage.  No known fevers. Has not taken any meds to help with pain.

## 2020-12-29 NOTE — Discharge Instructions (Addendum)
Do not put any water in your ears do not go swimming.  Do not stick Q-tips or other objects in the ear unless directed by provider.  Use drops and antibiotic as prescribed.  Follow-up with ENT if symptoms persist.  Have your blood pressure rechecked at your PCPs office it was elevated today in the urgent care, may be due to pain.

## 2021-10-25 ENCOUNTER — Emergency Department (HOSPITAL_COMMUNITY): Payer: Medicaid Other

## 2021-10-25 ENCOUNTER — Other Ambulatory Visit: Payer: Self-pay

## 2021-10-25 ENCOUNTER — Emergency Department (HOSPITAL_COMMUNITY)
Admission: EM | Admit: 2021-10-25 | Discharge: 2021-10-25 | Disposition: A | Discharge status: Home / Self Care | Payer: Medicaid Other | Attending: Emergency Medicine | Admitting: Emergency Medicine

## 2021-10-25 ENCOUNTER — Encounter (HOSPITAL_COMMUNITY): Payer: Self-pay | Admitting: Oncology

## 2021-10-25 DIAGNOSIS — M25561 Pain in right knee: Secondary | ICD-10-CM | POA: Diagnosis present

## 2021-10-25 MED ORDER — KETOROLAC TROMETHAMINE 15 MG/ML IJ SOLN
15.0000 mg | Freq: Once | INTRAMUSCULAR | Status: AC
  Administered 2021-10-25: 15 mg via INTRAMUSCULAR
  Filled 2021-10-25: qty 1

## 2021-10-25 MED ORDER — NAPROXEN 375 MG PO TABS: 375.0000 mg | ORAL_TABLET | Freq: Two times a day (BID) | ORAL | 0 refills | Status: DC

## 2021-10-25 MED ORDER — HYDROCODONE-ACETAMINOPHEN 5-325 MG PO TABS
2.0000 | ORAL_TABLET | Freq: Once | ORAL | Status: AC
  Administered 2021-10-25: 2 via ORAL
  Filled 2021-10-25: qty 2

## 2021-10-25 NOTE — ED Notes (Signed)
Patient given crackers and water

## 2021-10-25 NOTE — Discharge Instructions (Signed)
X-ray was negative like we discussed.  I would like you to follow-up with orthopedics for further evaluation.  Please return to the emergency department for any worsening symptoms you might have.

## 2021-10-25 NOTE — ED Triage Notes (Signed)
Pt c/o right knee pain x 3 days. Denies injury.

## 2021-10-25 NOTE — ED Provider Notes (Signed)
[facility]EMERGENCY DEPT Provider Note   CSN: [id number] Arrival date & time: 10/25/21  1812     History Chief Complaint  Patient presents with   Knee Pain    Meghan Pruitt is a 41 y.o. female who presents the emergency department with right knee pain that started 2 days ago.  Patient denies any trauma or injury.  She denies any fever or chills.  Patient has been able to walk however it has been difficult secondary to pain.   Knee Pain     Home Medications Prior to Admission medications   Medication Sig Start Date End Date Taking? Authorizing Provider  naproxen (NAPROSYN) 375 MG tablet Take 1 tablet (375 mg total) by mouth 2 (two) times daily. 10/25/21  Yes Meredeth Ide, Kodi Steil M, PA-C  acetaminophen (TYLENOL) 500 MG tablet Take 1,000 mg by mouth every 6 (six) hours as needed for mild pain, moderate pain, fever or headache.    [provider]  albuterol (PROVENTIL HFA;VENTOLIN HFA) 108 (90 Base) MCG/ACT inhaler Inhale 1-2 puffs into the lungs every 6 (six) hours as needed for wheezing or shortness of breath. 09/29/15   Trixie Dredge, PA-C  amoxicillin-clavulanate (AUGMENTIN) 875-125 MG tablet Take 1 tablet by mouth every 12 (twelve) hours. 12/29/20   Defelice, Para March, NP  benzonatate (TESSALON) 100 MG capsule Take 1 capsule (100 mg total) by mouth every 8 (eight) hours. 06/11/20   Caccavale, Sophia, PA-C  methocarbamol (ROBAXIN) 500 MG tablet Take 1 tablet (500 mg total) by mouth 2 (two) times daily. 09/22/20   Khatri, Hina, PA-C  ondansetron (ZOFRAN ODT) 4 MG disintegrating tablet Take 1 tablet (4 mg total) by mouth every 8 (eight) hours as needed for nausea or vomiting. 06/11/20   Caccavale, Sophia, PA-C  promethazine-dextromethorphan (PROMETHAZINE-DM) 6.25-15 MG/5ML syrup Take 5 mLs by mouth 4 (four) times daily as needed for cough. 06/11/20   Caccavale, Sophia, PA-C      Allergies    Patient has no known allergies.    Review of Systems   Review of Systems   All other systems reviewed and are negative.  Physical Exam Updated Vital Signs BP 129/83 (BP Location: Left Arm)   Pulse (!) 104   Temp 98.3 F (36.8 C) (Oral)   Resp 16   Ht 5\' 3"  (1.6 m)   Wt 91.2 kg   LMP 10/20/2021   SpO2 96%   BMI 35.61 kg/m  Physical Exam Vitals and nursing note reviewed.  Constitutional:      Appearance: Normal appearance.  HENT:     Head: Normocephalic and atraumatic.  Eyes:     General:        Right eye: No discharge.        Left eye: No discharge.     Conjunctiva/sclera: Conjunctivae normal.  Pulmonary:     Effort: Pulmonary effort is normal.  Musculoskeletal:     Comments: No significant swelling, erythema, or warmth over the right knee.  No stiffness.  Patella is nontender to palpation.  Patella is in normal alignment.  Skin:    General: Skin is warm and dry.     Findings: No rash.  Neurological:     General: No focal deficit present.     Mental Status: She is alert.  Psychiatric:        Mood and Affect: Mood normal.        Behavior: Behavior normal.    ED Results / Procedures / Treatments   Labs (all labs ordered  are listed, but only abnormal results are displayed) Labs Reviewed - No data to display  EKG None  Radiology DG Knee Complete 4 Views Right  Result Date: 10/25/2021 CLINICAL DATA:  Right knee pain. EXAM: RIGHT KNEE - COMPLETE 4+ VIEW COMPARISON:  None Available. FINDINGS: There is a suprapatellar joint effusion. There is no acute fracture or dislocation. No focal osseous lesion. Joint spaces are well maintained. IMPRESSION: 1. Joint effusion. 2. No acute bony abnormality. Electronically Signed   By: Darliss Cheney M.D.   On: 10/25/2021 19:59    Procedures Procedures    Medications Ordered in ED Medications  HYDROcodone-acetaminophen (NORCO/VICODIN) 5-325 MG per tablet 2 tablet (2 tablets Oral Given 10/25/21 2016)  ketorolac (TORADOL) 15 MG/ML injection 15 mg (15 mg Intramuscular Given 10/25/21 2016)    ED Course/  Medical Decision Making/ A&P                           Medical Decision Making Meghan Pruitt is a 41 y.o. female who presents the emergency department with right knee pain.  Low suspicion for septic arthritis at this time, gouty arthritis, hemarthrosis.  This could be possible arthritic changes, ligamentous strain.  X-ray was negative for any fractures or dislocations.  I ordered and interpreted this myself.  I given the patient Toradol and Norco in the emergency department which gave the patient relief.  I will have her follow-up in the outpatient setting.  Strict return precautions were discussed with the patient at the bedside.  She is safer discharge.   Amount and/or Complexity of Data Reviewed Radiology: ordered and independent interpretation performed. Decision-making details documented in ED Course.  Risk Prescription drug management. Risk Details: Patient feeling somewhat better after Toradol and Norco.  Patient still having pain.  I am going to have her follow-up with orthopedics.  I also get a give her crutches and a knee brace as well.  She is safer discharge.   Final Clinical Impression(s) / ED Diagnoses Final diagnoses:  Acute pain of right knee    Rx / DC Orders ED Discharge Orders          Ordered    naproxen (NAPROSYN) 375 MG tablet  2 times daily        10/25/21 2113              Teressa Lower, PA-C 10/25/21 2113    Glynn Octave, MD 10/26/21 Earle Gell

## 2021-10-27 ENCOUNTER — Encounter (HOSPITAL_COMMUNITY): Payer: Self-pay | Admitting: Emergency Medicine

## 2021-10-27 ENCOUNTER — Other Ambulatory Visit: Payer: Self-pay

## 2021-10-27 ENCOUNTER — Emergency Department (HOSPITAL_COMMUNITY)
Admission: EM | Admit: 2021-10-27 | Discharge: 2021-10-27 | Disposition: A | Discharge status: Home / Self Care | Payer: Medicaid Other | Attending: Student | Admitting: Student

## 2021-10-27 DIAGNOSIS — R000 Tachycardia, unspecified: Secondary | ICD-10-CM | POA: Insufficient documentation

## 2021-10-27 DIAGNOSIS — M25561 Pain in right knee: Secondary | ICD-10-CM | POA: Diagnosis present

## 2021-10-27 LAB — SYNOVIAL CELL COUNT + DIFF, W/ CRYSTALS
Crystals, Fluid: NONE SEEN
Eosinophils-Synovial: 0 % (ref 0–1)
Lymphocytes-Synovial Fld: 75 % — ABNORMAL HIGH (ref 0–20)
Monocyte-Macrophage-Synovial Fluid: 14 % — ABNORMAL LOW (ref 50–90)
Neutrophil, Synovial: 11 % (ref 0–25)
WBC, Synovial: 78 /mm3 (ref 0–200)

## 2021-10-27 MED ORDER — HYDROCODONE-ACETAMINOPHEN 5-325 MG PO TABS
2.0000 | ORAL_TABLET | Freq: Once | ORAL | Status: AC
  Administered 2021-10-27: 2 via ORAL
  Filled 2021-10-27: qty 2

## 2021-10-27 MED ORDER — LIDOCAINE-EPINEPHRINE 2 %-1:100000 IJ SOLN
20.0000 mL | Freq: Once | INTRAMUSCULAR | Status: AC
  Administered 2021-10-27: 20 mL
  Filled 2021-10-27: qty 1

## 2021-10-27 NOTE — Discharge Instructions (Signed)
The joint fluid is not infected, nor does it appear to be gout.  You will need to follow-up with the orthopedic doctor for further workup.

## 2021-10-27 NOTE — ED Triage Notes (Signed)
Pt states she was seen here on 5/18 for right knee pain and referred to ortho.  Pt states she has an appt with ortho on Friday but cannot wait to see them in the office d/t pain. States she cannot walk d/t pain and has not been able to work.

## 2021-10-27 NOTE — ED Provider Notes (Signed)
Patient requesting discharge.  WBC is 78, inconsistent with septic arthritis.  No crystals seen. Will refer to ortho.   Roxy Horseman, PA-C 10/27/21 2315    Glendora Score, MD 10/28/21 [phone]

## 2021-10-27 NOTE — ED Provider Notes (Signed)
[facility]   CSN: [id number] Arrival date & time: 10/27/21  1851     History  Chief Complaint  Patient presents with   Knee Pain    Meghan Pruitt is a 41 y.o. female.  41 year old female presents today for evaluation of right knee pain.  States this began at the beginning of the week.  Denies any known injury.  Denies prior history of gout.  Evaluated 2 days ago in this emergency room and prescribed naproxen.  States she has been taking Aleve and naproxen without improvement in symptoms.  States symptoms are worsening.  States progressively worsening swelling, and difficulty bearing weight and ambulating.  Was not able to go to work today.  The history is provided by the patient. No language interpreter was used.      Home Medications Prior to Admission medications   Medication Sig Start Date End Date Taking? Authorizing Provider  acetaminophen (TYLENOL) 500 MG tablet Take 1,000 mg by mouth every 6 (six) hours as needed for mild pain, moderate pain, fever or headache.    [provider]  albuterol (PROVENTIL HFA;VENTOLIN HFA) 108 (90 Base) MCG/ACT inhaler Inhale 1-2 puffs into the lungs every 6 (six) hours as needed for wheezing or shortness of breath. 09/29/15   Trixie Dredge, PA-C  amoxicillin-clavulanate (AUGMENTIN) 875-125 MG tablet Take 1 tablet by mouth every 12 (twelve) hours. 12/29/20   Defelice, Para March, NP  benzonatate (TESSALON) 100 MG capsule Take 1 capsule (100 mg total) by mouth every 8 (eight) hours. 06/11/20   Caccavale, Sophia, PA-C  methocarbamol (ROBAXIN) 500 MG tablet Take 1 tablet (500 mg total) by mouth 2 (two) times daily. 09/22/20   Khatri, Hina, PA-C  naproxen (NAPROSYN) 375 MG tablet Take 1 tablet (375 mg total) by mouth 2 (two) times daily. 10/25/21   Honor Loh M, PA-C  ondansetron (ZOFRAN ODT) 4 MG disintegrating tablet Take 1 tablet (4 mg total) by mouth every 8 (eight) hours as needed for nausea  or vomiting. 06/11/20   Caccavale, Sophia, PA-C  promethazine-dextromethorphan (PROMETHAZINE-DM) 6.25-15 MG/5ML syrup Take 5 mLs by mouth 4 (four) times daily as needed for cough. 06/11/20   Caccavale, Sophia, PA-C      Allergies    Patient has no known allergies.    Review of Systems   Review of Systems  Constitutional:  Negative for chills.  Musculoskeletal:  Positive for arthralgias, gait problem (Antalgic gait) and joint swelling.  Skin:  Negative for wound.  All other systems reviewed and are negative.  Physical Exam Updated Vital Signs BP (!) 123/91   Pulse (!) 108   Temp 98.8 F (37.1 C) (Oral)   Resp 16   Ht 5\' 3"  (1.6 m)   Wt 91.2 kg   LMP 10/20/2021   SpO2 97%   BMI 35.61 kg/m  Physical Exam Vitals and nursing note reviewed.  Constitutional:      General: She is not in acute distress.    Appearance: Normal appearance. She is not ill-appearing.  HENT:     Head: Normocephalic and atraumatic.     Nose: Nose normal.  Eyes:     Conjunctiva/sclera: Conjunctivae normal.  Cardiovascular:     Rate and Rhythm: Regular rhythm. Tachycardia present.  Pulmonary:     Effort: Pulmonary effort is normal. No respiratory distress.  Musculoskeletal:        General: No deformity.     Comments: Right knee joint with apparent swelling, and tenderness to palpation.  Right ankle with full range of motion, right hip with full range of motion.  Antalgic gait, and difficulty bearing weight.  Without overlying erythema.  Slight warmth to the lateral right knee.  2+ DP pulse present.  Skin:    Findings: No rash.  Neurological:     Mental Status: She is alert.    ED Results / Procedures / Treatments   Labs (all labs ordered are listed, but only abnormal results are displayed) Labs Reviewed - No data to display  EKG None  Radiology No results found.  Procedures .Joint Aspiration/Arthrocentesis  Date/Time: 10/27/2021 9:36 PM Performed by: Marita Kansas, PA-C Authorized by: Marita Kansas, PA-C   Consent:    Consent obtained:  Verbal   Consent given by:  Patient   Risks discussed:  Bleeding, infection, incomplete drainage, nerve damage and pain Universal protocol:    Procedure explained and questions answered to patient or proxy's satisfaction: yes     Relevant documents present and verified: yes     Test results available: yes     Patient identity confirmed:  Verbally with patient and arm band Location:    Location:  Knee   Knee:  R knee Anesthesia:    Anesthesia method:  Local infiltration   Local anesthetic:  Lidocaine 2% WITH epi Procedure details:    Needle gauge: 21 G.   Ultrasound guidance: no     Approach:  Medial   Aspirate amount:  10 CC   Aspirate characteristics:  Yellow   Steroid injected: no   Post-procedure details:    Dressing:  Adhesive bandage   Procedure completion:  Tolerated well, no immediate complications    Medications Ordered in ED Medications - No data to display  ED Course/ Medical Decision Making/ A&P                           Medical Decision Making Amount and/or Complexity of Data Reviewed Labs: ordered.  Risk Prescription drug management.   Medical Decision Making / ED Course   This patient presents to the ED for concern of right knee pain and swelling, this involves an extensive number of treatment options, and is a complaint that carries with it a high risk of complications and morbidity.  The differential diagnosis includes arthritis, gout, septic arthritis, pseudogout  MDM: 41 year old female presents today for worsening right knee pain.  Atraumatic.  X-ray done 2 days ago showed joint effusion, no bony injury.  Denies history of gout.  Afebrile.  Tachycardic and difficulty bearing weight.  Tenderness to palpation present.  Discussed arthrocentesis of the right knee.  Patient is agreeable. Joint arthrocentesis done.  10 cc of fluid drawn off.  We will send off for cytology. Patient at the end of the shift  awaiting arthrocentesis results. Patient signed out to [employer] Tests: -I ordered, reviewed, and interpreted labs.   The pertinent results include:   Labs Reviewed  BODY FLUID CULTURE W GRAM STAIN  GLUCOSE, BODY FLUID OTHER            PROTEIN, BODY FLUID (OTHER)  SYNOVIAL CELL COUNT + DIFF, W/ CRYSTALS  URIC ACID, BODY FLUID      EKG  EKG Interpretation  Date/Time:    Ventricular Rate:    PR Interval:    QRS Duration:   QT Interval:    QTC Calculation:   R Axis:     Text Interpretation:  Medicines ordered and prescription drug management: Meds ordered this encounter  Medications   lidocaine-EPINEPHrine (XYLOCAINE W/EPI) 2 %-1:100000 (with pres) injection 20 mL    -I have reviewed the patients home medicines and have made adjustments as needed  Co morbidities that complicate the patient evaluation  Past Medical History:  Diagnosis Date   Abdominal pain in pregnancy, second trimester 09/14/2016   Back pain during pregnancy in second trimester 09/14/2016   Bacterial vaginosis 09/14/2016   Chronic bronchitis (HCC)    Hearing loss    left ear      Dispostion: Patient at the end of shift is awaiting results of her arthrocentesis.  Signed out to oncoming provider.  Final Clinical Impression(s) / ED Diagnoses Final diagnoses:  None    Rx / DC Orders ED Discharge Orders     None         Marita Kansasli, Olamae Ferrara, PA-C 10/27/21 2203    Glendora ScoreKommor, [place], MD 10/28/21 1410

## 2021-10-29 LAB — PROTEIN, BODY FLUID (OTHER): Total Protein, Body Fluid Other: 3.2 g/dL

## 2021-10-29 LAB — URIC ACID, BODY FLUID: Uric Acid Body Fluid: 4.6 mg/dL

## 2021-10-29 LAB — GLUCOSE, BODY FLUID OTHER: Glucose, Body Fluid Other: 175 mg/dL

## 2021-10-31 LAB — BODY FLUID CULTURE W GRAM STAIN
Culture: NO GROWTH
Gram Stain: NONE SEEN

## 2023-04-15 ENCOUNTER — Ambulatory Visit
Admission: EM | Admit: 2023-04-15 | Discharge: 2023-04-15 | Disposition: A | Discharge status: Home / Self Care | Payer: Medicaid Other | Attending: Internal Medicine | Admitting: Internal Medicine

## 2023-04-15 DIAGNOSIS — M542 Cervicalgia: Secondary | ICD-10-CM | POA: Diagnosis not present

## 2023-04-15 DIAGNOSIS — R0602 Shortness of breath: Secondary | ICD-10-CM | POA: Diagnosis not present

## 2023-04-15 DIAGNOSIS — H6992 Unspecified Eustachian tube disorder, left ear: Secondary | ICD-10-CM

## 2023-04-15 DIAGNOSIS — R030 Elevated blood-pressure reading, without diagnosis of hypertension: Secondary | ICD-10-CM | POA: Diagnosis not present

## 2023-04-15 DIAGNOSIS — F17200 Nicotine dependence, unspecified, uncomplicated: Secondary | ICD-10-CM

## 2023-04-15 LAB — POCT FASTING CBG KUC MANUAL ENTRY: POCT Glucose (KUC): 184 mg/dL — AB (ref 70–99)

## 2023-04-15 MED ORDER — FLUTICASONE PROPIONATE 50 MCG/ACT NA SUSP: 2.0000 | Freq: Every day | NASAL | 0 refills | Status: DC

## 2023-04-15 MED ORDER — CETIRIZINE HCL 10 MG PO TABS: 10.0000 mg | ORAL_TABLET | Freq: Every day | ORAL | 0 refills | Status: DC

## 2023-04-15 MED ORDER — HYDROCHLOROTHIAZIDE 12.5 MG PO TABS: 12.5000 mg | ORAL_TABLET | Freq: Every day | ORAL | 0 refills | Status: DC

## 2023-04-15 MED ORDER — ALBUTEROL SULFATE HFA 108 (90 BASE) MCG/ACT IN AERS: 1.0000 | INHALATION_SPRAY | Freq: Four times a day (QID) | RESPIRATORY_TRACT | 0 refills | Status: DC | PRN

## 2023-04-15 MED ORDER — EXCEDRIN MIGRAINE 250-250-65 MG PO TABS: 1.0000 | ORAL_TABLET | Freq: Four times a day (QID) | ORAL | 0 refills | Status: DC | PRN

## 2023-04-15 NOTE — ED Triage Notes (Signed)
Pt reports headache, high blood pressure 135/110 and left ear pain x 1 day. States she called EMS today to have them to check her blood pressure, states EMS wanted to take her to the ED but she did not have anybody to take care of her kids. Aleve gives some relief.

## 2023-04-15 NOTE — Discharge Instructions (Signed)
For diabetes or elevated blood sugar, please make sure you are limiting and avoiding starchy, carbohydrate foods like pasta, breads, sweet breads, pastry, rice, potatoes, desserts. These foods can elevate your blood sugar. Also, limit and avoid drinks that contain a lot of sugar such as sodas, sweet teas, fruit juices.  Drinking plain water will be much more helpful, try 64 ounces of water daily.  It is okay to flavor your water naturally by cutting cucumber, lemon, mint or lime, placing it in a picture with water and drinking it over a period of 24-48 hours as long as it remains refrigerated. ? ?For elevated blood pressure, make sure you are monitoring salt in your diet.  Do not eat restaurant foods and limit processed foods at home. I highly recommend you prepare and cook your own foods at home.  Processed foods include things like frozen meals, pre-seasoned meats and dinners, deli meats, canned foods as these foods contain a high amount of sodium/salt.  Make sure you are paying attention to sodium labels on foods you buy at the grocery store. Buy your spices separately such as garlic powder, onion powder, cumin, cayenne, parsley flakes so that you can avoid seasonings that contain salt. However, salt-free seasonings are available and can be used, an example is Mrs. Dash and includes a lot of different mixtures that do not contain salt. ? ?Lastly, when cooking using oils that are healthier for you is important. This includes olive oil, avocado oil, canola oil. We have discussed a lot of foods to avoid but below is a list of foods that can be very healthy to use in your diet whether it is for diabetes, cholesterol, high blood pressure, or in general healthy eating. ? ?Salads - kale, spinach, cabbage, spring mix, arugula ?Fruits - avocadoes, berries (blueberries, raspberries, blackberries), apples, oranges, pomegranate, grapefruit, kiwi ?Vegetables - asparagus, cauliflower, broccoli, green beans, brussel sprouts,  bell peppers, beets; stay away from or limit starchy vegetables like potatoes, carrots, peas ?Other general foods - kidney beans, egg whites, almonds, walnuts, sunflower seeds, pumpkin seeds, fat free yogurt, almond milk, flax seeds, quinoa, oats  ?Meat - It is better to eat lean meats and limit your red meat including pork to once a week.  Wild caught fish, chicken breast are good options as they tend to be leaner sources of good protein. Still be mindful of the sodium labels for the meats you buy. ? ?DO NOT EAT ANY FOODS ON THIS LIST THAT YOU ARE ALLERGIC TO. For more specific needs, I highly recommend consulting a dietician or nutritionist but this can definitely be a good starting point. ? ?

## 2023-04-15 NOTE — ED Provider Notes (Signed)
[facility] - URG[facility]  Note:  This document was prepared using Conservation officer, historic buildings and may include unintentional dictation errors.  MRN: [id number] DOB: 01-Sep-1980  Subjective:   Meghan Pruitt is a 42 y.o. female presenting for multiple chief complaints.  Primarily, patient is concerned about her blood pressure.  The past 2 to 3 days has had some headaches, left ear pain, left neck pain, shortness of breath, malaise, fatigue.  She called EMS and they checked her blood pressure.  EMS recommended an ER visit but patient did not have any caregivers for her kids.  She has been using Aleve with some relief.  Has a history of persistent intermittent ear infections, has been recommended to have surgical procedures by her ENT but has not followed up with them.  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  Has not checked her blood sugar or had an annual physical in years.  No diabetes to her knowledge.  Has never had to take blood pressure medication either.  Smokes 1 pack/day.  No drug use.  She does have a history of chronic bronchitis, asthma.   Current Facility-Administered Medications:    etonogestrel (NEXPLANON) implant 68 mg, 68 mg, Subdermal, Once, Brock Bad, MD  Current Outpatient Medications:    naproxen sodium (ALEVE) 220 MG tablet, Take 220 mg by mouth., Disp: , Rfl:    acetaminophen (TYLENOL) 500 MG tablet, Take 1,000 mg by mouth every 6 (six) hours as needed for mild pain, moderate pain, fever or headache., Disp: , Rfl:    albuterol (PROVENTIL HFA;VENTOLIN HFA) 108 (90 Base) MCG/ACT inhaler, Inhale 1-2 puffs into the lungs every 6 (six) hours as needed for wheezing or shortness of breath., Disp: 1 Inhaler, Rfl: 0   amoxicillin-clavulanate (AUGMENTIN) 875-125 MG tablet, Take 1 tablet by mouth every 12 (twelve) hours., Disp: 14 tablet, Rfl: 0   benzonatate (TESSALON) 100 MG capsule, Take 1 capsule (100 mg total) by mouth every 8 (eight) hours.,  Disp: 21 capsule, Rfl: 0   methocarbamol (ROBAXIN) 500 MG tablet, Take 1 tablet (500 mg total) by mouth 2 (two) times daily., Disp: 20 tablet, Rfl: 0   naproxen (NAPROSYN) 375 MG tablet, Take 1 tablet (375 mg total) by mouth 2 (two) times daily., Disp: 20 tablet, Rfl: 0   ondansetron (ZOFRAN ODT) 4 MG disintegrating tablet, Take 1 tablet (4 mg total) by mouth every 8 (eight) hours as needed for nausea or vomiting., Disp: 20 tablet, Rfl: 0   promethazine-dextromethorphan (PROMETHAZINE-DM) 6.25-15 MG/5ML syrup, Take 5 mLs by mouth 4 (four) times daily as needed for cough., Disp: 118 mL, Rfl: 0   No Known Allergies  Past Medical History:  Diagnosis Date   Abdominal pain in pregnancy, second trimester 09/14/2016   Back pain during pregnancy in second trimester 09/14/2016   Bacterial vaginosis 09/14/2016   Chronic bronchitis (HCC)    Hearing loss    left ear     Past Surgical History:  Procedure Laterality Date   BREAST BIOPSY Right 02/25/2014   abscess    Family History  Problem Relation Age of Onset   Healthy Mother    Healthy Father    Heart disease Maternal Grandfather    Heart disease Paternal Grandmother    Breast cancer Paternal Aunt     Social History   Tobacco Use   Smoking status: Every Day    Current packs/day: 0.45    Average packs/day: 0.5 packs/day for 7.0 years (3.2  ttl pk-yrs)    Types: Cigarettes   Smokeless tobacco: Never  Vaping Use   Vaping status: Never Used  Substance Use Topics   Alcohol use: Yes    Comment: occasionally   Drug use: No    ROS   Objective:   Vitals: BP (!) 150/110 (BP Location: Right Arm)   Pulse 81   Temp 98.6 F (37 C) (Oral)   Resp 18   LMP 03/25/2023 (Approximate)   SpO2 95%   BP Readings from Last 3 Encounters:  04/15/23 (!) 150/110  10/27/21 (!) 123/91  10/25/21 129/83   Physical Exam Constitutional:      General: She is not in acute distress.    Appearance: Normal appearance. She is well-developed and normal  weight. She is not ill-appearing, toxic-appearing or diaphoretic.  HENT:     Head: Normocephalic and atraumatic.     Right Ear: Tympanic membrane, ear canal and external ear normal. No drainage or tenderness. No middle ear effusion. There is no impacted cerumen. Tympanic membrane is not perforated, erythematous or bulging.     Left Ear: Ear canal and external ear normal. No drainage or tenderness.  No middle ear effusion. There is no impacted cerumen. Tympanic membrane is perforated. Tympanic membrane is not erythematous or bulging.     Nose: Congestion present. No rhinorrhea.     Mouth/Throat:     Mouth: Mucous membranes are moist. No oral lesions.     Pharynx: No pharyngeal swelling, oropharyngeal exudate, posterior oropharyngeal erythema or uvula swelling.     Tonsils: No tonsillar exudate or tonsillar abscesses.  Eyes:     General: No scleral icterus.       Right eye: No discharge.        Left eye: No discharge.     Extraocular Movements: Extraocular movements intact.     Right eye: Normal extraocular motion.     Left eye: Normal extraocular motion.     Conjunctiva/sclera: Conjunctivae normal.     Pupils: Pupils are equal, round, and reactive to light.  Cardiovascular:     Rate and Rhythm: Normal rate and regular rhythm.     Heart sounds: Normal heart sounds. No murmur heard.    No friction rub. No gallop.  Pulmonary:     Effort: Pulmonary effort is normal. No respiratory distress.     Breath sounds: No stridor. No wheezing, rhonchi or rales.  Chest:     Chest wall: No tenderness.  Abdominal:     General: Bowel sounds are normal. There is no distension.     Palpations: Abdomen is soft. There is no mass.     Tenderness: There is no abdominal tenderness. There is no right CVA tenderness, left CVA tenderness, guarding or rebound.  Musculoskeletal:     Cervical back: Normal range of motion and neck supple.  Lymphadenopathy:     Cervical: No cervical adenopathy.  Skin:    General:  Skin is warm and dry.  Neurological:     General: No focal deficit present.     Mental Status: She is alert and oriented to person, place, and time.     Cranial Nerves: No cranial nerve deficit, dysarthria or facial asymmetry.     Motor: No weakness.     Coordination: Romberg sign negative. Coordination normal.     Gait: Gait normal.     Deep Tendon Reflexes: Reflexes normal.  Psychiatric:        Mood and Affect: Mood normal.  Speech: Speech normal.        Behavior: Behavior normal.        Thought Content: Thought content normal.        Judgment: Judgment normal.     ED ECG REPORT   Date: 04/15/2023  EKG Time: 12:27 PM  Rate: Sinus rhythm at 89 bpm  Rhythm: normal sinus rhythm,  unchanged from previous tracings  Axis: Normal  Intervals:none  ST&T Change: T wave flattening in V3  Narrative Interpretation: Sinus rhythm at 89 bpm with poor R wave progression, nonspecific T wave changes above.  Very comparable to previous EKGs.   Assessment and Plan :   PDMP not reviewed this encounter.  1. Eustachian tube dysfunction, left   2. Elevated blood pressure reading without diagnosis of hypertension   3. Shortness of breath   4. Neck pain on left side   5. Smoker     At this stage I do not suspect ACS, acute encephalopathy.  Recommended very close follow-up with a new PCP and maintaining strict ER precautions.  Emphasized need for strict lifestyle modifications including smoking cessation, weight loss, dietary changes.  Will start low-dose hydrochlorothiazide at 12.5 mg once daily.  Recommended Zyrtec and Flonase for suspected eustachian tube dysfunction.  Provided with a prescription for albuterol given her shortness of breath, smoking and history of chronic bronchitis.  Counseled patient on potential for adverse effects with medications prescribed/recommended today, ER and return-to-clinic precautions discussed, patient verbalized understanding.    Wallis Bamberg, PA-C 04/15/23  1406

## 2023-06-30 ENCOUNTER — Emergency Department (HOSPITAL_COMMUNITY): Payer: Medicaid Other

## 2023-06-30 ENCOUNTER — Inpatient Hospital Stay (HOSPITAL_COMMUNITY)
Admission: EM | Admit: 2023-06-30 | Discharge: 2023-07-03 | DRG: 871 | Disposition: A | Discharge status: Home / Self Care | Payer: Medicaid Other | Attending: Family Medicine | Admitting: Family Medicine

## 2023-06-30 ENCOUNTER — Encounter (HOSPITAL_COMMUNITY): Payer: Self-pay | Admitting: Family Medicine

## 2023-06-30 ENCOUNTER — Other Ambulatory Visit: Payer: Self-pay

## 2023-06-30 DIAGNOSIS — E66812 Obesity, class 2: Secondary | ICD-10-CM | POA: Diagnosis present

## 2023-06-30 DIAGNOSIS — E860 Dehydration: Secondary | ICD-10-CM | POA: Diagnosis present

## 2023-06-30 DIAGNOSIS — J1008 Influenza due to other identified influenza virus with other specified pneumonia: Secondary | ICD-10-CM | POA: Diagnosis present

## 2023-06-30 DIAGNOSIS — J9601 Acute respiratory failure with hypoxia: Secondary | ICD-10-CM | POA: Diagnosis present

## 2023-06-30 DIAGNOSIS — G9341 Metabolic encephalopathy: Secondary | ICD-10-CM | POA: Diagnosis present

## 2023-06-30 DIAGNOSIS — E119 Type 2 diabetes mellitus without complications: Secondary | ICD-10-CM

## 2023-06-30 DIAGNOSIS — Z789 Other specified health status: Secondary | ICD-10-CM | POA: Diagnosis not present

## 2023-06-30 DIAGNOSIS — F17210 Nicotine dependence, cigarettes, uncomplicated: Secondary | ICD-10-CM | POA: Diagnosis present

## 2023-06-30 DIAGNOSIS — R4182 Altered mental status, unspecified: Secondary | ICD-10-CM

## 2023-06-30 DIAGNOSIS — G9340 Encephalopathy, unspecified: Secondary | ICD-10-CM | POA: Diagnosis not present

## 2023-06-30 DIAGNOSIS — E872 Acidosis, unspecified: Secondary | ICD-10-CM | POA: Diagnosis present

## 2023-06-30 DIAGNOSIS — E876 Hypokalemia: Secondary | ICD-10-CM | POA: Diagnosis present

## 2023-06-30 DIAGNOSIS — A419 Sepsis, unspecified organism: Secondary | ICD-10-CM | POA: Diagnosis present

## 2023-06-30 DIAGNOSIS — N289 Disorder of kidney and ureter, unspecified: Secondary | ICD-10-CM | POA: Diagnosis present

## 2023-06-30 DIAGNOSIS — E0590 Thyrotoxicosis, unspecified without thyrotoxic crisis or storm: Secondary | ICD-10-CM

## 2023-06-30 DIAGNOSIS — J111 Influenza due to unidentified influenza virus with other respiratory manifestations: Principal | ICD-10-CM

## 2023-06-30 DIAGNOSIS — R739 Hyperglycemia, unspecified: Secondary | ICD-10-CM | POA: Insufficient documentation

## 2023-06-30 DIAGNOSIS — Z1152 Encounter for screening for COVID-19: Secondary | ICD-10-CM

## 2023-06-30 DIAGNOSIS — J440 Chronic obstructive pulmonary disease with acute lower respiratory infection: Secondary | ICD-10-CM | POA: Diagnosis present

## 2023-06-30 DIAGNOSIS — J159 Unspecified bacterial pneumonia: Secondary | ICD-10-CM | POA: Diagnosis present

## 2023-06-30 DIAGNOSIS — J441 Chronic obstructive pulmonary disease with (acute) exacerbation: Secondary | ICD-10-CM | POA: Diagnosis present

## 2023-06-30 DIAGNOSIS — R319 Hematuria, unspecified: Secondary | ICD-10-CM | POA: Diagnosis present

## 2023-06-30 DIAGNOSIS — R9431 Abnormal electrocardiogram [ECG] [EKG]: Secondary | ICD-10-CM | POA: Diagnosis not present

## 2023-06-30 DIAGNOSIS — E1165 Type 2 diabetes mellitus with hyperglycemia: Secondary | ICD-10-CM | POA: Diagnosis present

## 2023-06-30 DIAGNOSIS — Z6838 Body mass index (BMI) 38.0-38.9, adult: Secondary | ICD-10-CM | POA: Diagnosis not present

## 2023-06-30 DIAGNOSIS — E874 Mixed disorder of acid-base balance: Secondary | ICD-10-CM | POA: Diagnosis present

## 2023-06-30 LAB — COMPREHENSIVE METABOLIC PANEL
ALT: 40 U/L (ref 0–44)
AST: 67 U/L — ABNORMAL HIGH (ref 15–41)
Albumin: 3.4 g/dL — ABNORMAL LOW (ref 3.5–5.0)
Alkaline Phosphatase: 61 U/L (ref 38–126)
Anion gap: 11 (ref 5–15)
BUN: 8 mg/dL (ref 6–20)
CO2: 25 mmol/L (ref 22–32)
Calcium: 9 mg/dL (ref 8.9–10.3)
Chloride: 99 mmol/L (ref 98–111)
Creatinine, Ser: 1 mg/dL (ref 0.44–1.00)
GFR, Estimated: >60 mL/min (ref >60)
Glucose, Bld: 227 mg/dL — ABNORMAL HIGH (ref 70–99)
Potassium: 3.8 mmol/L (ref 3.5–5.1)
Sodium: 135 mmol/L (ref 135–145)
Total Bilirubin: 1.2 mg/dL (ref 0.0–1.2)
Total Protein: 6.9 g/dL (ref 6.5–8.1)

## 2023-06-30 LAB — TROPONIN I (HIGH SENSITIVITY)
Troponin I (High Sensitivity): 7 ng/L (ref <18)
Troponin I (High Sensitivity): 8 ng/L (ref <18)

## 2023-06-30 LAB — CBC WITH DIFFERENTIAL/PLATELET
Abs Immature Granulocytes: 0.02 10*3/uL (ref 0.00–0.07)
Basophils Absolute: 0 10*3/uL (ref 0.0–0.1)
Basophils Relative: 0 %
Eosinophils Absolute: 0 10*3/uL (ref 0.0–0.5)
Eosinophils Relative: 0 %
HCT: 41.9 % (ref 36.0–46.0)
Hemoglobin: 13.7 g/dL (ref 12.0–15.0)
Immature Granulocytes: 0 %
Lymphocytes Relative: 9 %
Lymphs Abs: 0.7 10*3/uL (ref 0.7–4.0)
MCH: 31.8 pg (ref 26.0–34.0)
MCHC: 32.7 g/dL (ref 30.0–36.0)
MCV: 97.2 fL (ref 80.0–100.0)
Monocytes Absolute: 0.7 10*3/uL (ref 0.1–1.0)
Monocytes Relative: 9 %
Neutro Abs: 6.1 10*3/uL (ref 1.7–7.7)
Neutrophils Relative %: 82 %
Platelets: 173 10*3/uL (ref 150–400)
RBC: 4.31 MIL/uL (ref 3.87–5.11)
RDW: 12.5 % (ref 11.5–15.5)
WBC: 7.5 10*3/uL (ref 4.0–10.5)
nRBC: 0 % (ref 0.0–0.2)

## 2023-06-30 LAB — CBG MONITORING, ED
Glucose-Capillary: 164 mg/dL — ABNORMAL HIGH (ref 70–99)
Glucose-Capillary: 168 mg/dL — ABNORMAL HIGH (ref 70–99)
Glucose-Capillary: 478 mg/dL — ABNORMAL HIGH (ref 70–99)

## 2023-06-30 LAB — I-STAT VENOUS BLOOD GAS, ED
Acid-Base Excess: 3 mmol/L — ABNORMAL HIGH (ref 0.0–2.0)
Bicarbonate: 29.1 mmol/L — ABNORMAL HIGH (ref 20.0–28.0)
Calcium, Ion: 1.18 mmol/L (ref 1.15–1.40)
HCT: 42 % (ref 36.0–46.0)
Hemoglobin: 14.3 g/dL (ref 12.0–15.0)
O2 Saturation: 90 %
Potassium: 3.9 mmol/L (ref 3.5–5.1)
Sodium: 138 mmol/L (ref 135–145)
TCO2: 31 mmol/L (ref 22–32)
pCO2, Ven: 47.6 mm[Hg] (ref 44–60)
pH, Ven: 7.394 (ref 7.25–7.43)
pO2, Ven: 59 mm[Hg] — ABNORMAL HIGH (ref 32–45)

## 2023-06-30 LAB — URINALYSIS, W/ REFLEX TO CULTURE (INFECTION SUSPECTED)
Bilirubin Urine: NEGATIVE
Glucose, UA: NEGATIVE mg/dL
Ketones, ur: NEGATIVE mg/dL
Leukocytes,Ua: NEGATIVE
Nitrite: NEGATIVE
Protein, ur: NEGATIVE mg/dL
RBC / HPF: >50 RBC/hpf (ref 0–5)
Specific Gravity, Urine: >1.046 — ABNORMAL HIGH (ref 1.005–1.030)
pH: 6 (ref 5.0–8.0)

## 2023-06-30 LAB — CARBOXYHEMOGLOBIN - COOX: Carboxyhemoglobin: 2 % — ABNORMAL HIGH (ref 0.5–1.5)

## 2023-06-30 LAB — PROTIME-INR
INR: 1.1 (ref 0.8–1.2)
Prothrombin Time: 14.7 s (ref 11.4–15.2)

## 2023-06-30 LAB — RAPID URINE DRUG SCREEN, HOSP PERFORMED
Amphetamines: NOT DETECTED
Barbiturates: NOT DETECTED
Benzodiazepines: NOT DETECTED
Cocaine: POSITIVE — AB
Opiates: NOT DETECTED
Tetrahydrocannabinol: POSITIVE — AB

## 2023-06-30 LAB — PROCALCITONIN: Procalcitonin: <0.1 ng/mL

## 2023-06-30 LAB — APTT: aPTT: 30 s (ref 24–36)

## 2023-06-30 LAB — HCG, SERUM, QUALITATIVE: Preg, Serum: NEGATIVE

## 2023-06-30 LAB — I-STAT CG4 LACTIC ACID, ED
Lactic Acid, Venous: 2.4 mmol/L (ref 0.5–1.9)
Lactic Acid, Venous: 1.2 mmol/L (ref 0.5–1.9)

## 2023-06-30 LAB — HEMOGLOBIN A1C
Hgb A1c MFr Bld: 9.9 % — ABNORMAL HIGH (ref 4.8–5.6)
Mean Plasma Glucose: 237.43 mg/dL

## 2023-06-30 LAB — RESP PANEL BY RT-PCR (RSV, FLU A&B, COVID)  RVPGX2
Influenza A by PCR: POSITIVE — AB
Influenza B by PCR: NEGATIVE
Resp Syncytial Virus by PCR: NEGATIVE
SARS Coronavirus 2 by RT PCR: NEGATIVE

## 2023-06-30 LAB — BRAIN NATRIURETIC PEPTIDE: B Natriuretic Peptide: 35.4 pg/mL (ref 0.0–100.0)

## 2023-06-30 LAB — LIPASE, BLOOD: Lipase: 35 U/L (ref 11–51)

## 2023-06-30 LAB — TSH: TSH: 0.212 u[IU]/mL — ABNORMAL LOW (ref 0.350–4.500)

## 2023-06-30 LAB — T4, FREE: Free T4: 0.78 ng/dL (ref 0.61–1.12)

## 2023-06-30 MED ORDER — ACETAMINOPHEN 325 MG PO TABS
650.0000 mg | ORAL_TABLET | Freq: Once | ORAL | Status: AC
  Administered 2023-06-30: 650 mg via ORAL
  Filled 2023-06-30: qty 2

## 2023-06-30 MED ORDER — IOHEXOL 350 MG/ML SOLN
150.0000 mL | Freq: Once | INTRAVENOUS | Status: AC | PRN
  Administered 2023-06-30: 150 mL via INTRAVENOUS

## 2023-06-30 MED ORDER — IPRATROPIUM-ALBUTEROL 0.5-2.5 (3) MG/3ML IN SOLN
3.0000 mL | RESPIRATORY_TRACT | Status: DC
  Administered 2023-06-30 – 2023-07-01 (×4): 3 mL via RESPIRATORY_TRACT
  Filled 2023-06-30 (×4): qty 3

## 2023-06-30 MED ORDER — METHYLPREDNISOLONE SODIUM SUCC 40 MG IJ SOLR
40.0000 mg | Freq: Once | INTRAMUSCULAR | Status: AC
  Administered 2023-06-30: 40 mg via INTRAVENOUS
  Filled 2023-06-30: qty 1

## 2023-06-30 MED ORDER — LACTATED RINGERS IV BOLUS
1000.0000 mL | Freq: Once | INTRAVENOUS | Status: AC
  Administered 2023-06-30: 1000 mL via INTRAVENOUS

## 2023-06-30 MED ORDER — LACTATED RINGERS IV BOLUS (SEPSIS)
1000.0000 mL | Freq: Once | INTRAVENOUS | Status: AC
  Administered 2023-06-30: 1000 mL via INTRAVENOUS

## 2023-06-30 MED ORDER — LACTATED RINGERS IV SOLN: INTRAVENOUS | Status: DC

## 2023-06-30 MED ORDER — IPRATROPIUM-ALBUTEROL 0.5-2.5 (3) MG/3ML IN SOLN
3.0000 mL | Freq: Once | RESPIRATORY_TRACT | Status: AC
  Administered 2023-06-30: 3 mL via RESPIRATORY_TRACT
  Filled 2023-06-30: qty 3

## 2023-06-30 MED ORDER — OSELTAMIVIR PHOSPHATE 75 MG PO CAPS
75.0000 mg | ORAL_CAPSULE | Freq: Two times a day (BID) | ORAL | Status: DC
  Administered 2023-06-30 – 2023-07-03 (×7): 75 mg via ORAL
  Filled 2023-06-30 (×8): qty 1

## 2023-06-30 MED ORDER — SODIUM CHLORIDE 0.9 % IV SOLN
2.0000 g | Freq: Once | INTRAVENOUS | Status: AC
  Administered 2023-06-30: 2 g via INTRAVENOUS
  Filled 2023-06-30: qty 20

## 2023-06-30 MED ORDER — INSULIN ASPART 100 UNIT/ML IJ SOLN
0.0000 [IU] | Freq: Three times a day (TID) | INTRAMUSCULAR | Status: DC
  Administered 2023-07-01: 5 [IU] via SUBCUTANEOUS
  Administered 2023-07-01: 2 [IU] via SUBCUTANEOUS

## 2023-06-30 MED ORDER — PREDNISONE 20 MG PO TABS
40.0000 mg | ORAL_TABLET | Freq: Every day | ORAL | Status: DC
  Administered 2023-07-01 – 2023-07-02 (×2): 40 mg via ORAL
  Filled 2023-06-30 (×2): qty 2

## 2023-06-30 MED ORDER — ENOXAPARIN SODIUM 60 MG/0.6ML IJ SOSY
50.0000 mg | PREFILLED_SYRINGE | INTRAMUSCULAR | Status: DC
  Administered 2023-06-30 – 2023-07-02 (×3): 50 mg via SUBCUTANEOUS
  Filled 2023-06-30 (×3): qty 0.6

## 2023-06-30 MED ORDER — ACETAMINOPHEN 325 MG PO TABS
650.0000 mg | ORAL_TABLET | Freq: Four times a day (QID) | ORAL | Status: DC | PRN
  Administered 2023-07-01 – 2023-07-02 (×4): 650 mg via ORAL
  Filled 2023-06-30 (×3): qty 2

## 2023-06-30 MED ORDER — SODIUM CHLORIDE 0.9 % IV SOLN
500.0000 mg | Freq: Once | INTRAVENOUS | Status: AC
  Administered 2023-06-30: 500 mg via INTRAVENOUS
  Filled 2023-06-30: qty 5

## 2023-06-30 NOTE — Progress Notes (Signed)
   06/30/23 2037  Therapy Vitals  Pulse Rate (!) 116  Resp 18  Patient Position (if appropriate) Lying  MEWS Score/Color  MEWS Score 2  MEWS Score Color Yellow  Respiratory Assessment  Assessment Type Assess only  Respiratory Pattern Regular;Unlabored  Chest Assessment Chest expansion symmetrical  Cough Congested  Bilateral Breath Sounds Clear;Diminished  Oxygen Therapy/Pulse Ox  O2 Device (S)  HFNC  O2 Therapy Oxygen humidified  O2 Flow Rate (L/min) 10 L/min  SpO2 100 %   RT called for O2 desaturation to the 70's on 2LNC. RT witnessed pt snoring to the point she woke herself up. Pt coherent when awake. No SOB stated by pt. Pt states she does not and has never wore a CPAP.  MD notified of this episode. Pt on a salter at 10L and tolerating well at this time. RT will assist as needed.

## 2023-06-30 NOTE — Plan of Care (Signed)
FMTS Interim Progress Note  Saw patient at bedside with Dr. Rexene Alberts.  Patient intermittently sleeping but awakens to voice.  She reports that she remains unchanged has a significant cough.  O: BP 122/70 (BP Location: Left Arm)   Pulse (!) 115   Temp 99.4 F (37.4 C)   Resp (!) 26   Ht 5\' 3"  (1.6 m)   Wt 99.8 kg   SpO2 95%   BMI 38.97 kg/m   General: NAD, pleasant, able to participate in exam Respiratory: Increased WOB, intermittently coughing,  Skin: warm and dry, no rashes noted   A/P: Flu A  Acute Hypoxemic Respiratory Failure  High risk respiratory patient who initially presented with acute encephalopathy of unknown origin.  Patient had new oxygen requirement upon arrival as well.  As the day has progressed, the patient has become more alert and oriented.  Is aware of location, current illness.  Daughter is in the room with the patient.  She is more sedated than baseline but daughter does report that she awakens easily to voice.  Daughter also reports that the patient intermittently falls asleep throughout the day, has been going on prior to the new onset of symptoms that began 2 days ago.  CTAPE negative for pulmonary embolism.  While in the room, the patient was having multiple desaturations to the 70s.  After encouraging deep breathing and increasing oxygen to 6 L, called respiratory to come and evaluate.  They placed her on high flow nasal cannula.  Will reassess the patient frequently throughout the night with low threshold for BiPAP/CCM evaluation if intubation is needed based on mental status.  Currently on prednisone, receiving DuoNebs scheduled, Tamiflu.  Concerned that OSA/OHS plays a role into her current status as well as possible COPD as she does smoke.  History of cocaine use recently, EKG overall without acute ST changes and troponin trending flat making cardiac involvement less likely.  Blood cultures pending.  I do feel that her intermittent encephalopathy is related to  her breathing status and hypoxia.  Reassured as head imaging was overall negative.  Will monitor mental status if she requires BiPAP.  Low threshold for CCM involvement.   Meghan Martinez, MD 06/30/2023, 8:35 PM PGY-3, [facility] - Inpatient Family Medicine Service pager [phone]

## 2023-06-30 NOTE — ED Triage Notes (Addendum)
Patient presents via EMS for shortness of breath, cough, and generalized body aches x3 days.Per EMS, patient was satting at 91% on room air and was subsquently placed on 4L [place] with improvement to 100%. EMS reports crackles in the patient's lungs. Patient transitioned to room air upon arrival satting SpO2 92-94% on room air; tachycardic and tachypnic. Patient placed on 2L [place] for comfort.

## 2023-06-30 NOTE — Assessment & Plan Note (Addendum)
Patient arrived febrile (102.8), tachycardic (120s), tachypneic (28), with found to be Influenza A +, meeting sepsis criteria. Also hypoxic (70-80s), and obtunded. No leukocytosis. Sepsis workup was initiated. CXR with interstitial prominence, possible bacterial pneumonia- will obtain pro-cal. CT head, CTPE, and CT abdomen unremarkable. UA with hematuria. Received CTX and azithromycin x1, and 1L fluid bolus. - Cont maintenance LR 183mL/hr - Tylenol 650 q6 prn for fever  - Fu blood culture - consider abx as indicated - f/u procal - CTM fever curve and VS

## 2023-06-30 NOTE — H&P (Addendum)
Hospital Admission History and Physical Service Pager: [phone]  Patient name: Meghan Pruitt Medical record number: [id number] Date of Birth: August 28, 1980 Age: 43 y.o. Gender: female  Primary Care Provider: Pcp, No Consultants: None Code Status: Full by default - will need to reassess Preferred Emergency Contact:  Name Relation Home Work Mobile  [place] Mother [phone]  [phone]   Chief Complaint: Cough, chest pain  Assessment and Plan: Meghan Pruitt is a 43 y.o. female presenting with 2 days of cough, chest pain, and fever. Differential for presentation of this includes:   Acute Hypoxic Respiratory Failure URI: Most likely given positive Flu A test. Symptoms consistent with URI.  COPD/Asthma Exacerbation: Smoking hx, wheezing on exam. No formal COPD dx. Bacterial pneumonia: Met SIRS criteria, CXR with some left basilar opacities. patient has formal diagnosis, no previous spirometry.  CHF: Hx of cocaine use. Lower suspicion normal BNP and euvolemic exam. PE: CTPE without evidence of PE, however artifact on imaging. ACS: Less likely, negative troponins and unremarkable EKG  Altered mental status Acute hypoxia: Most likely given known URI with desats into 70-80s and prominent cough.  CO2 poisoning: Possibly, will obtain carboxyHgb to rule out CO poisoning  Substances: Possibly given positive UDS.  Metabolic: Less likely given normal electrolytes.  Seizure: Less likely given no reported history. Stroke: Not likely given unremarkable CT head.  Hypoglycemia: Not likely given elevated BG 227 arrival.   Assessment & Plan Acute hypoxic respiratory failure (HCC) Patient arrived febrile (102.8), tachycardic (120s), hypoxic (70-80s), and tachypneic (28) found to be Influenza A +. CXR with interstitial prominence. Most concerning for AHRF 2/2 URI. Considering component of COPD as well given smoking history and exam. EKG, BNP, CTPE unremarkable. Received CTX and  azithromycin x1 in ED.  - Admit to Progressive with FMTS, attending Dr Linwood Dibbles  - Duonebs scheduled q4 - Solumedrol 40mg  x1 today  - Prednisone 40mg  x4 days starting tomorrow  - Tamiflu 75 BID x 5 days  - Tylenol 650 q6 prn  - Carboxyhemoglobin ordered to rule out CO poisoning  - Supplement O2 as needed  - Continuous cardiac monitoring  - Droplet precautions - Vital signs per floor  - AM BMP, CBC Sepsis (HCC) Patient arrived febrile (102.8), tachycardic (120s), tachypneic (28), with found to be Influenza A +, meeting sepsis criteria. Also hypoxic (70-80s), and obtunded. No leukocytosis. Sepsis workup was initiated. CXR with interstitial prominence, possible bacterial pneumonia- will obtain pro-cal. CT head, CTPE, and CT abdomen unremarkable. UA with hematuria. Received CTX and azithromycin x1, and 1L fluid bolus. - Cont maintenance LR 152mL/hr - Tylenol 650 q6 prn for fever  - Fu blood culture - consider abx as indicated - f/u procal - CTM fever curve and VS  Altered mental status Altered on arrival. While difficult to arouse, patient able to cite name, birthday, and place. VBG with normal pH. Unremarkable CT Head. No electrolyte imbalance noted. Suspect AMS secondary to hypoxia 2/2 URI, with possible THC sedating effects.  - Neuro checks q4  - TSH ordered - Fall precautions - Consider MRI/EEG and Neuro consult if mental status not improving Hyperglycemia BG 227 on arrival. No documented hx of diabetes. Last A1c 6.1 in 2020.  - A1c ordered  - CBGs TID AC - Consider SSI if needed  Hematuria UA with hematuria on arrival. No nitrites or leukocytes. Febrile to 102.8. Given normal Cr, urine production and electrolytes low concern for intrarenal disease.  - Plan to repeat UA after discussion with  patient - Ask about urinary symptoms when patient more alert  Chronic and Stable Problems:  Hx of chronic bronchitis - reports previously had an albuterol inhaler, does not now  FEN/GI: NPO  given AMS VTE Prophylaxis: Lovenox  Disposition: Progressive, FMTS with attending Dr Linwood Dibbles   History of Present Illness:  Meghan Pruitt is a 43 y.o. female presenting with 2 days of cough, chest pain, and fever found to be be Flu A positive.   HPI limited by patient altered state, see wakes briefly answers simple questions, then falls back asleep. States she began feeling sick 2 days ago, with cough, chest pain, and fever. States her daughter has been sick too. Not taking any medications. Reports usually feels sleepy. Had previously been given albuterol inhaler, not taking anymore.   In the ED, patient febrile (102.8), hypoxic (70-80s), tachypneic (28). Placed on 2-4L O2. Sepsis workup initiated. EKG unremarkable, Trops negative, BNP wnl. CXR with interstitial prominence. CT head, CTPE, and CT abdomen unremarkable. UA with hematuria. UDS positive for cocaine and THC. Lactic acid 2.4 > 1.2. VBG normal pH. Patient received CTX and azithromycin x1 and 1L fluid bolus.   Review Of Systems: Per HPI   Pertinent Past Medical History: Chronic bronchitis  - reports previously had an albuterol inhaler, does not now Remainder reviewed in history tab.   Pertinent Past Surgical History: Breast biopsy (abscess) 2015 Remainder reviewed in history tab.   Pertinent Social History: Tobacco use: Current - 0.5 packs per day Alcohol use: Occasional Other Substance use: No Lives with her children  Pertinent Family History: Paternal grandmother - heart disease Paternal grandfather - heart disease Paternal aunt - breast cancer Remainder reviewed in history tab.   Important Outpatient Medications: Nexplanon - reports she doesn't have this anymore  Aleve prn  Remainder reviewed in medication history.   Objective: BP (!) 116/97   Pulse (!) 113   Temp (!) 102.8 F (39.3 C) (Oral)   Resp 19   Ht 5\' 3"  (1.6 m)   Wt 99.8 kg   SpO2 98%   BMI 38.97 kg/m  Exam: General: Sleeping. Difficult to  arouse, responds to repeated shoulder shaking.  CV: Normal S1/S2. No extra heart sounds. Reproducible chest pain to sternal palpation. Warm and well-perfused throughout, normal radial and DP pulses bilaterally.  Pulm: Initially desatting into 70s on RA, then satting well on 2L O2. Diffuse wheezes throughout. Poor air movement bilaterally. Increased WOB with some subcostal retractions.  Abd: Soft, non-tender, non-distended. Reducible supraumbilical hernia, no R inguinal masses appreciated. No erythema throughout.  Skin/Ext:  Warm, dry. No LE edema.  Neuro: Obtunded, awoken by persistent shoulder shaking and loud voice. Oriented to name, birthday, and place.    Labs:  CBC BMET  Recent Labs  Lab 06/30/23 1043 06/30/23 1054  WBC 7.5  --   HGB 13.7 14.3  HCT 41.9 42.0  PLT 173  --    Recent Labs  Lab 06/30/23 1043 06/30/23 1054  NA 135 138  K 3.8 3.9  CL 99  --   CO2 25  --   BUN 8  --   CREATININE 1.00  --   GLUCOSE 227*  --   CALCIUM 9.0  --      Flu A positive Lactic acid 2.4 > 1.2 UDS: +Cocaine, +THC UA: hematuria  Lipase negative  BNP 35.4 VBG: pH 7.394, Bicarb 29.1  EKG: No ischemic findings.   Imaging Studies Performed: CXR: Low lung volumes. Bilateral interstitial prominence. Streaky  left basilar opacities.  CTPE: Negative CT head: Negative CT A/P: supraumbilical hernia, right inguinal hernia  Ivery Quale, MD 06/30/2023, 2:40 PM PGY-1, [facility] Family Medicine  FPTS Intern pager: [phone], text pages welcome Secure chat group [facility] Teaching Service   Upper Level Addendum: I have seen and evaluated this patient along with Dr. Threasa Beards and reviewed the above note, making necessary revisions as appropriate. I agree with the medical decision making and physical exam as noted above. Tiffany Kocher, DO PGY-2 [facility] Family Medicine Residency

## 2023-06-30 NOTE — Sepsis Progress Note (Signed)
Elink monitoring for the code sepsis protocol.  

## 2023-06-30 NOTE — ED Notes (Signed)
ED phlebotomy to come draw admission labs.

## 2023-06-30 NOTE — Plan of Care (Addendum)
Called sister Jettie Pagan), she confirmed patient's identity.  She is patient's secondary contact.  Provided update of patient's current state to sister.  Sister provided additional history including: Patient has been ill for greater than 2 weeks, and does not routinely see a physician.  She smokes greater than half a pack per day, and sister suspects she uses cocaine and marijuana.  She is a social drinker, no daily drinking.  Sister is requesting to be primary contact over mother, as mother is ill and frequently visits the ED.  I discussed with sister that I will need to check with patient when she is more alert to make sure that this is okay.

## 2023-06-30 NOTE — ED Provider Notes (Signed)
[place] EMERGENCY DEPARTMENT AT [facility] Provider Note   CSN: [id number] Arrival date & time: 06/30/23  0957     History  Chief Complaint  Patient presents with   Cough   Shortness of Breath   Generalized Body Aches    Meghan Pruitt is a 43 y.o. female.   Cough Associated symptoms: shortness of breath   Shortness of Breath Associated symptoms: cough   43 year old female presenting for cough, shortness of breath.  Patient presents via EMS.  They report patient has had 3 days of cough, body aches.  She was 90% on room air and febrile to 102 with EMS.  She is crackles in bilateral lungs.  Patient is slightly somnolent arouses to voice.  She states she is been sick for the last 3 days.  She has had some shortness of breath, cough and chest pain.  Unsure about fevers.  Denies abdominal pain or nausea or vomiting.     Home Medications Prior to Admission medications   Medication Sig Start Date End Date Taking? Authorizing Provider  naproxen sodium (ALEVE) 220 MG tablet Take 880 mg by mouth daily as needed (pain, headache).   Yes [provider]      Allergies    Patient has no known allergies.    Review of Systems   Review of Systems  Respiratory:  Positive for cough and shortness of breath.   Review of systems completed and notable as per HPI.  ROS otherwise negative.   Physical Exam Updated Vital Signs BP 116/77   Pulse (!) 114   Temp (!) 100.6 F (38.1 C) (Oral)   Resp (!) 24   Ht 5\' 3"  (1.6 m)   Wt 99.8 kg   SpO2 95%   BMI 38.97 kg/m  Physical Exam Vitals and nursing note reviewed.  Constitutional:      Appearance: She is ill-appearing.  HENT:     Head: Normocephalic and atraumatic.     Mouth/Throat:     Mouth: Mucous membranes are moist.     Pharynx: Oropharynx is clear.  Eyes:     Extraocular Movements: Extraocular movements intact.     Conjunctiva/sclera: Conjunctivae normal.     Pupils: Pupils are equal, round, and  reactive to light.  Cardiovascular:     Rate and Rhythm: Regular rhythm. Tachycardia present.     Pulses: Normal pulses.     Heart sounds: Normal heart sounds. No murmur heard. Pulmonary:     Effort: Pulmonary effort is normal. Tachypnea present. No respiratory distress.     Breath sounds: Rhonchi present.  Abdominal:     Palpations: Abdomen is soft.     Tenderness: There is no abdominal tenderness.  Musculoskeletal:        General: No swelling.     Cervical back: Neck supple.     Right lower leg: No edema.     Left lower leg: No edema.  Skin:    General: Skin is warm and dry.     Capillary Refill: Capillary refill takes less than 2 seconds.  Neurological:     General: No focal deficit present.     Mental Status: She is alert.     Cranial Nerves: No cranial nerve deficit.     Sensory: No sensory deficit.     Motor: No weakness.     Comments: Patient is somewhat somnolent but arouses to voice.  Her speech is normal.  She is oriented to person, place, situation.  Normal  speech.  No visual field deficits.  Extraocular movements are intact.  Normal strength and sensation in bilateral upper and lower extremities.  No neglect.  Psychiatric:        Mood and Affect: Mood normal.     ED Results / Procedures / Treatments   Labs (all labs ordered are listed, but only abnormal results are displayed) Labs Reviewed  RESP PANEL BY RT-PCR (RSV, FLU A&B, COVID)  RVPGX2 - Abnormal; Notable for the following components:      Result Value   Influenza A by PCR POSITIVE (*)    All other components within normal limits  COMPREHENSIVE METABOLIC PANEL - Abnormal; Notable for the following components:   Glucose, Bld 227 (*)    Albumin 3.4 (*)    AST 67 (*)    All other components within normal limits  URINALYSIS, W/ REFLEX TO CULTURE (INFECTION SUSPECTED) - Abnormal; Notable for the following components:   Specific Gravity, Urine >1.046 (*)    Hgb urine dipstick LARGE (*)    Bacteria, UA RARE  (*)    All other components within normal limits  RAPID URINE DRUG SCREEN, HOSP PERFORMED - Abnormal; Notable for the following components:   Cocaine POSITIVE (*)    Tetrahydrocannabinol POSITIVE (*)    All other components within normal limits  I-STAT VENOUS BLOOD GAS, ED - Abnormal; Notable for the following components:   pO2, Ven 59 (*)    Bicarbonate 29.1 (*)    Acid-Base Excess 3.0 (*)    All other components within normal limits  I-STAT CG4 LACTIC ACID, ED - Abnormal; Notable for the following components:   Lactic Acid, Venous 2.4 (*)    All other components within normal limits  CBG MONITORING, ED - Abnormal; Notable for the following components:   Glucose-Capillary 164 (*)    All other components within normal limits  CULTURE, BLOOD (ROUTINE X 2)  CULTURE, BLOOD (ROUTINE X 2)  CBC WITH DIFFERENTIAL/PLATELET  PROTIME-INR  APTT  HCG, SERUM, QUALITATIVE  BRAIN NATRIURETIC PEPTIDE  LIPASE, BLOOD  HIV ANTIBODY (ROUTINE TESTING W REFLEX)  TSH  HEMOGLOBIN A1C  CARBOXYHEMOGLOBIN - COOX  I-STAT CG4 LACTIC ACID, ED  TROPONIN I (HIGH SENSITIVITY)  TROPONIN I (HIGH SENSITIVITY)    EKG EKG Interpretation Date/Time:  Monday June 30 2023 10:23:07 EST Ventricular Rate:  116 PR Interval:  160 QRS Duration:  85 QT Interval:  305 QTC Calculation: 424 R Axis:   61  Text Interpretation: Sinus tachycardia Low voltage, precordial leads Confirmed by Fulton Reek [phone]) on 06/30/2023 10:46:21 AM  Radiology CT Angio Chest PE W/Cm &/Or Wo Cm Result Date: 06/30/2023 CLINICAL DATA:  Three day history of shortness of breath, cough, and generalized body aches EXAM: CT ANGIOGRAPHY CHEST CT ABDOMEN AND PELVIS WITH CONTRAST TECHNIQUE: Multidetector CT imaging of the chest was performed using the standard protocol during bolus administration of intravenous contrast. Multiplanar CT image reconstructions and MIPs were obtained to evaluate the vascular anatomy. Multidetector CT imaging of the  abdomen and pelvis was performed using the standard protocol during bolus administration of intravenous contrast. RADIATION DOSE REDUCTION: This exam was performed according to the departmental dose-optimization program which includes automated exposure control, adjustment of the mA and/or kV according to patient size and/or use of iterative reconstruction technique. CONTRAST:  OMNIPAQUE IOHEXOL 350 MG/ML SOLN COMPARISON:  Same day chest radiograph, CT abdomen and pelvis dated 11/13/2004 FINDINGS: CTA CHEST FINDINGS Decreased sensitivity and specificity for detailed findings due to motion artifact.  Cardiovascular: The study is adequate for the evaluation of pulmonary embolism to the level of the proximal lobar arteries. There are no filling defects in the central or lobar pulmonary arteries to suggest acute pulmonary embolism. Great vessels are normal in course and caliber. Normal heart size. No significant pericardial fluid/thickening. Mediastinum/Nodes: Imaged thyroid gland without nodules meeting criteria for imaging follow-up by size. Small hiatal hernia. No pathologically enlarged axillary, supraclavicular, mediastinal, or hilar lymph nodes. Lungs/Pleura: The central airways are patent. No suspicious pulmonary nodules. No focal consolidation. No pneumothorax. No pleural effusion. Musculoskeletal: No acute or abnormal lytic or blastic osseous lesions. Review of the MIP images confirms the above findings. CT ABDOMEN and PELVIS FINDINGS Hepatobiliary: No focal hepatic lesions. No intra or extrahepatic biliary ductal dilation. Normal gallbladder. Pancreas: No focal lesions or main ductal dilation. Spleen: Normal in size without focal abnormality. Adrenals/Urinary Tract: No adrenal nodules. No suspicious renal mass, calculi, or hydronephrosis. No focal bladder wall thickening. Stomach/Bowel: Normal appearance of the stomach. No evidence of bowel wall thickening, distention, or inflammatory changes. Normal  appendix. Vascular/Lymphatic: No significant vascular findings are present. No enlarged abdominal or pelvic lymph nodes. Reproductive: No adnexal masses. Other: No free fluid, fluid collection, or free air. Mild mesenteric stranding deep to the anterior abdominal herniation as below. Musculoskeletal: No acute or abnormal lytic or blastic osseous lesions. Moderate fat-containing ventral midline supraumbilical hernia. Small fat containing right inguinal hernia. IMPRESSION: 1. Decreased sensitivity and specificity for detailed findings due to motion artifact. No evidence of pulmonary embolism to the level of the proximal lobar arteries or other acute intrathoracic process. 2. No acute abdominopelvic findings. 3. Moderate fat-containing ventral midline supraumbilical hernia with mild mesenteric stranding deep to the herniation. Recommend correlation with physical examination. 4. Small fat containing right inguinal hernia. Electronically Signed   By: Agustin Cree M.D.   On: 06/30/2023 14:15   CT ABDOMEN PELVIS W CONTRAST Result Date: 06/30/2023 CLINICAL DATA:  Three day history of shortness of breath, cough, and generalized body aches EXAM: CT ANGIOGRAPHY CHEST CT ABDOMEN AND PELVIS WITH CONTRAST TECHNIQUE: Multidetector CT imaging of the chest was performed using the standard protocol during bolus administration of intravenous contrast. Multiplanar CT image reconstructions and MIPs were obtained to evaluate the vascular anatomy. Multidetector CT imaging of the abdomen and pelvis was performed using the standard protocol during bolus administration of intravenous contrast. RADIATION DOSE REDUCTION: This exam was performed according to the departmental dose-optimization program which includes automated exposure control, adjustment of the mA and/or kV according to patient size and/or use of iterative reconstruction technique. CONTRAST:  OMNIPAQUE IOHEXOL 350 MG/ML SOLN COMPARISON:  Same day chest radiograph, CT  abdomen and pelvis dated 11/13/2004 FINDINGS: CTA CHEST FINDINGS Decreased sensitivity and specificity for detailed findings due to motion artifact. Cardiovascular: The study is adequate for the evaluation of pulmonary embolism to the level of the proximal lobar arteries. There are no filling defects in the central or lobar pulmonary arteries to suggest acute pulmonary embolism. Great vessels are normal in course and caliber. Normal heart size. No significant pericardial fluid/thickening. Mediastinum/Nodes: Imaged thyroid gland without nodules meeting criteria for imaging follow-up by size. Small hiatal hernia. No pathologically enlarged axillary, supraclavicular, mediastinal, or hilar lymph nodes. Lungs/Pleura: The central airways are patent. No suspicious pulmonary nodules. No focal consolidation. No pneumothorax. No pleural effusion. Musculoskeletal: No acute or abnormal lytic or blastic osseous lesions. Review of the MIP images confirms the above findings. CT ABDOMEN and PELVIS FINDINGS Hepatobiliary: No focal  hepatic lesions. No intra or extrahepatic biliary ductal dilation. Normal gallbladder. Pancreas: No focal lesions or main ductal dilation. Spleen: Normal in size without focal abnormality. Adrenals/Urinary Tract: No adrenal nodules. No suspicious renal mass, calculi, or hydronephrosis. No focal bladder wall thickening. Stomach/Bowel: Normal appearance of the stomach. No evidence of bowel wall thickening, distention, or inflammatory changes. Normal appendix. Vascular/Lymphatic: No significant vascular findings are present. No enlarged abdominal or pelvic lymph nodes. Reproductive: No adnexal masses. Other: No free fluid, fluid collection, or free air. Mild mesenteric stranding deep to the anterior abdominal herniation as below. Musculoskeletal: No acute or abnormal lytic or blastic osseous lesions. Moderate fat-containing ventral midline supraumbilical hernia. Small fat containing right inguinal hernia.  IMPRESSION: 1. Decreased sensitivity and specificity for detailed findings due to motion artifact. No evidence of pulmonary embolism to the level of the proximal lobar arteries or other acute intrathoracic process. 2. No acute abdominopelvic findings. 3. Moderate fat-containing ventral midline supraumbilical hernia with mild mesenteric stranding deep to the herniation. Recommend correlation with physical examination. 4. Small fat containing right inguinal hernia. Electronically Signed   By: Agustin Cree M.D.   On: 06/30/2023 14:15   CT Head Wo Contrast Result Date: 06/30/2023 CLINICAL DATA:  Altered mental status. EXAM: CT HEAD WITHOUT CONTRAST TECHNIQUE: Contiguous axial images were obtained from the base of the skull through the vertex without intravenous contrast. RADIATION DOSE REDUCTION: This exam was performed according to the departmental dose-optimization program which includes automated exposure control, adjustment of the mA and/or kV according to patient size and/or use of iterative reconstruction technique. COMPARISON:  CT head dated September 04, 2013. FINDINGS: Brain: No evidence of acute infarction, hemorrhage, hydrocephalus, extra-axial collection or mass lesion/mass effect. Vascular: No hyperdense vessel or unexpected calcification. Skull: Normal. Negative for fracture or focal lesion. Sinuses/Orbits: Mild paranasal sinus mucosal thickening without air-fluid level. Partial opacification of the left middle ear and mastoid air cells. The orbits are unremarkable. Other: None. IMPRESSION: 1. No acute intracranial abnormality. Electronically Signed   By: Obie Dredge M.D.   On: 06/30/2023 14:10   DG Chest Port 1 View Result Date: 06/30/2023 CLINICAL DATA:  Concern for sepsis. EXAM: PORTABLE CHEST 1 VIEW COMPARISON:  Chest radiograph dated September 22, 2020. FINDINGS: Low lung volumes. The heart size and mediastinal contours are within normal limits. Bilateral interstitial prominence. Streaky opacities at  the left lung base. No sizable pleural effusion. No pneumothorax. No acute osseous abnormality. IMPRESSION: 1. Low lung volumes. Bilateral interstitial prominence could reflect bronchovascular crowding, interstitial edema, or atypical infection. 2. Streaky left basilar opacities may represent infiltrate, asymmetric edema, or atelectasis. Electronically Signed   By: Hart Robinsons M.D.   On: 06/30/2023 11:32    Procedures Procedures    Medications Ordered in ED Medications  lactated ringers infusion ( Intravenous New Bag/Given 06/30/23 1124)  ipratropium-albuterol (DUONEB) 0.5-2.5 (3) MG/3ML nebulizer solution 3 mL (has no administration in time range)  enoxaparin (LOVENOX) injection 50 mg (has no administration in time range)  acetaminophen (TYLENOL) tablet 650 mg (has no administration in time range)  predniSONE (DELTASONE) tablet 40 mg (has no administration in time range)  methylPREDNISolone sodium succinate (SOLU-MEDROL) 40 mg/mL injection 40 mg (has no administration in time range)  oseltamivir (TAMIFLU) capsule 75 mg (has no administration in time range)  lactated ringers bolus 1,000 mL (0 mLs Intravenous Stopped 06/30/23 1300)  cefTRIAXone (ROCEPHIN) 2 g in sodium chloride 0.9 % 100 mL IVPB (0 g Intravenous Stopped 06/30/23 1149)  azithromycin (ZITHROMAX) 500  mg in sodium chloride 0.9 % 250 mL IVPB (0 mg Intravenous Stopped 06/30/23 1334)  iohexol (OMNIPAQUE) 350 MG/ML injection 150 mL (150 mLs Intravenous Contrast Given 06/30/23 1302)  acetaminophen (TYLENOL) tablet 650 mg (650 mg Oral Given 06/30/23 1337)  ipratropium-albuterol (DUONEB) 0.5-2.5 (3) MG/3ML nebulizer solution 3 mL (3 mLs Nebulization Given 06/30/23 1414)    ED Course/ Medical Decision Making/ A&P                                 Medical Decision Making Amount and/or Complexity of Data Reviewed Labs: ordered. Radiology: ordered.  Risk Prescription drug management. Decision regarding hospitalization.   Medical  Decision Making:   TALEAHA GROSSO is a 43 y.o. female who presented to the ED today with 3 days of fever, shortness of breath, cough.  She arrives febrile, tachycardic and tachypneic.  I am concern for sepsis likely from pneumonia.  Antibiotics and sepsis protocol ordered.  She appears euvolemic.  I do not see any history of heart failure.  She is slightly somnolent, will obtain blood gas to evaluate for possible hypercapnia.  She is no focal deficits I have lower concern for stroke.  Blood sugar slightly elevated.   Patient placed on continuous vitals and telemetry monitoring while in ED which was reviewed periodically.  Reviewed and confirmed nursing documentation for past medical history, family history, social history.  Reassessment and Plan:   Labs reviewed.  No hypercapnia or respiratory acidosis.  She has mild lactic acidosis.  Renal function looks okay.  She is not in DKA.  CBC is unremarkable.  She is flu positive.  Lactic acid improving with fluids.  CT head is unremarkable.  Reassessment she is more awake and alert.  Still has no focal deficits to suspect encephalopathy in setting of sepsis likely from flu.  CTA of the chest without clear pneumonia, PE or intra-abdominal pathology.  She has a hernia with some fat in it, she is nontender over this area on my reevaluation and does not have any abdominal pain currently have low suspicion for mesenteric ischemia or strangulated hernia especially with downtrending lactic acid.  However she still not quite back to her baseline, and still tachycardic concerning for possible sepsis from flu.  Handoff was given to the internal medicine team and she was admitted for further management.   Patient's presentation is most consistent with acute presentation with potential threat to life or bodily function.           Final Clinical Impression(s) / ED Diagnoses Final diagnoses:  Influenza  Encephalopathy, unspecified type    Rx / DC  Orders ED Discharge Orders     None         Laurence Spates, MD 06/30/23 [phone]

## 2023-06-30 NOTE — Assessment & Plan Note (Addendum)
Patient arrived febrile (102.8), tachycardic (120s), hypoxic (70-80s), and tachypneic (28) found to be Influenza A +. CXR with interstitial prominence. Most concerning for AHRF 2/2 URI. Considering component of COPD as well given smoking history and exam. EKG, BNP, CTPE unremarkable. Received CTX and azithromycin x1 in ED.  - Admit to Progressive with FMTS, attending Dr Linwood Dibbles  - Duonebs scheduled q4 - Solumedrol 40mg  x1 today  - Prednisone 40mg  x4 days starting tomorrow  - Tamiflu 75 BID x 5 days  - Tylenol 650 q6 prn  - Carboxyhemoglobin ordered to rule out CO poisoning  - Supplement O2 as needed  - Continuous cardiac monitoring  - Droplet precautions - Vital signs per floor  - AM BMP, CBC

## 2023-06-30 NOTE — Assessment & Plan Note (Addendum)
UA with hematuria on arrival. No nitrites or leukocytes. Febrile to 102.8. Given normal Cr, urine production and electrolytes low concern for intrarenal disease.  - Plan to repeat UA after discussion with patient - Ask about urinary symptoms when patient more alert

## 2023-06-30 NOTE — Assessment & Plan Note (Addendum)
Altered on arrival. While difficult to arouse, patient able to cite name, birthday, and place. VBG with normal pH. Unremarkable CT Head. No electrolyte imbalance noted. Suspect AMS secondary to hypoxia 2/2 URI, with possible THC sedating effects.  - Neuro checks q4  - TSH ordered - Fall precautions - Consider MRI/EEG and Neuro consult if mental status not improving

## 2023-06-30 NOTE — ED Notes (Signed)
Patient transported to/from CT with RN on continuous cardiac and pulse oximetry monitoring.

## 2023-06-30 NOTE — Assessment & Plan Note (Signed)
BG 227 on arrival. No documented hx of diabetes. Last A1c 6.1 in 2020.  - A1c ordered  - CBGs TID AC - Consider SSI if needed

## 2023-06-30 NOTE — Plan of Care (Addendum)
FMTS Interim Progress Note  S: Noticed RED MEWS on chart checking.  At the time, nurse had not yet been reassigned.  Went to check on patient.  Patient is more alert now.  She is now conversing, and spontaneously awake-which is significant improvement since admission at 2:40 PM.  O: BP 100/75   Pulse (!) 109   Temp (!) 100.6 F (38.1 C) (Oral)   Resp (!) 37   Ht 5\' 3"  (1.6 m)   Wt 99.8 kg   SpO2 93%   BMI 38.97 kg/m    General: Awake, alert, conversing Respiratory: Normal work of breathing on room air, RR approximately 14.  Oxygen saturation 100% on 2 L LFNC.  A/P: Fever, tachycardia, acute hypoxic respiratory failure Anticipate fever related to influenza and possible pneumonia.  Sinus tachycardia 2/2 infection.  Respiratory rate recorded by monitor is inaccurate. Patient is protecting airway-do not see need for intubation.  -Continue care plan as outlined in H&P - If respiratory status declines, caution against BiPAP if patient altered  Hypotension Given continued Tachycardia with hypotensive reading will give additional 1L bolus.   AMS Patient is hungry, and less obtunded. - Bedside swallow, notified nursing staff - Restart regular diet if she passes bedside swallow  Diabetes A1C 9.9. -Added SSI  Low TSH -Obtain Free T4  Tiffany Kocher, DO 06/30/2023, 5:21 PM PGY-2, [facility] pager [phone]

## 2023-06-30 NOTE — Hospital Course (Addendum)
Meghan Pruitt is a 43 y.o.female with a history of chronic bronchitis who was admitted to the [facility] at [facility] for hypoxic respiratory failure. Her hospital course is detailed below:  Flu A  Acute Hypoxic Respiratory Failure:  Presented to ED for 2 days of congestion and is altered on admission. Found to have Flu A, started on oxygen therapy and antiviral. In ED, EKG unremarkable, Trops negative, BNP wnl. CXR with interstitial prominence. CT head, CTPE, and CT abdomen unremarkable. UA with hematuria. UDS positive for cocaine and THC. Patient had gradual improvement of respiratory status, was also started on prednisone for anti-inflammatory properties. Echo demonstrated LVEF 60-65%, no regional wall motion abnormalities, and IVC with greater than 50% respiratory variability.  Acute Encephalopathy  Improved over course of admission. Likely 2/2 hypoxia and acute illness. Suspect component of sleep apnea and polysubstance use as well (cocaine and THC+). Pt was at baseline mentation on discharge.  AKI Likely pre-renal in setting of dehydration. Returned to baseline prior to discharge.  New onset T2DM A1C 9. Added SSI. Discharged with *** regimen.   Other chronic conditions were medically managed with home medications and formulary alternatives as necessary  PCP Follow-up Recommendations: Recheck UA as outpatient given hematuria Recheck TSH as outpatient (TSH low, T4 normal here) Recommend assessment for OSA Patient with NEW ONSET diabetes and BMI 38, would be a good candidate for Ozempic/GLP-1 Recommend PFTs outpatient

## 2023-06-30 NOTE — Sepsis Progress Note (Signed)
Notified bedside nurse of need to draw repeat lactic acid. 

## 2023-07-01 ENCOUNTER — Inpatient Hospital Stay (HOSPITAL_COMMUNITY): Payer: Medicaid Other

## 2023-07-01 DIAGNOSIS — R9431 Abnormal electrocardiogram [ECG] [EKG]: Secondary | ICD-10-CM

## 2023-07-01 DIAGNOSIS — J9601 Acute respiratory failure with hypoxia: Secondary | ICD-10-CM | POA: Diagnosis not present

## 2023-07-01 DIAGNOSIS — G9340 Encephalopathy, unspecified: Secondary | ICD-10-CM | POA: Diagnosis not present

## 2023-07-01 DIAGNOSIS — E0590 Thyrotoxicosis, unspecified without thyrotoxic crisis or storm: Secondary | ICD-10-CM

## 2023-07-01 DIAGNOSIS — J111 Influenza due to unidentified influenza virus with other respiratory manifestations: Principal | ICD-10-CM

## 2023-07-01 LAB — GLUCOSE, CAPILLARY
Glucose-Capillary: 425 mg/dL — ABNORMAL HIGH (ref 70–99)
Glucose-Capillary: 351 mg/dL — ABNORMAL HIGH (ref 70–99)

## 2023-07-01 LAB — CBG MONITORING, ED
Glucose-Capillary: 323 mg/dL — ABNORMAL HIGH (ref 70–99)
Glucose-Capillary: 276 mg/dL — ABNORMAL HIGH (ref 70–99)
Glucose-Capillary: 173 mg/dL — ABNORMAL HIGH (ref 70–99)
Glucose-Capillary: 251 mg/dL — ABNORMAL HIGH (ref 70–99)
Glucose-Capillary: 419 mg/dL — ABNORMAL HIGH (ref 70–99)

## 2023-07-01 LAB — BASIC METABOLIC PANEL
Anion gap: 6 (ref 5–15)
BUN: 8 mg/dL (ref 6–20)
CO2: 27 mmol/L (ref 22–32)
Calcium: 8.6 mg/dL — ABNORMAL LOW (ref 8.9–10.3)
Chloride: 103 mmol/L (ref 98–111)
Creatinine, Ser: 0.88 mg/dL (ref 0.44–1.00)
GFR, Estimated: >60 mL/min (ref >60)
Glucose, Bld: 424 mg/dL — ABNORMAL HIGH (ref 70–99)
Potassium: 4.5 mmol/L (ref 3.5–5.1)
Sodium: 136 mmol/L (ref 135–145)

## 2023-07-01 LAB — CBC
HCT: 37.7 % (ref 36.0–46.0)
Hemoglobin: 11.9 g/dL — ABNORMAL LOW (ref 12.0–15.0)
MCH: 31.7 pg (ref 26.0–34.0)
MCHC: 31.6 g/dL (ref 30.0–36.0)
MCV: 100.5 fL — ABNORMAL HIGH (ref 80.0–100.0)
Platelets: 163 10*3/uL (ref 150–400)
RBC: 3.75 MIL/uL — ABNORMAL LOW (ref 3.87–5.11)
RDW: 12.7 % (ref 11.5–15.5)
WBC: 6.8 10*3/uL (ref 4.0–10.5)
nRBC: 0 % (ref 0.0–0.2)

## 2023-07-01 LAB — ECHOCARDIOGRAM COMPLETE
Area-P 1/2: 6.32 cm2
Calc EF: 59.3 %
Height: 63 in
S' Lateral: 2.9 cm
Single Plane A2C EF: 55.3 %
Single Plane A4C EF: 63.2 %
Weight: 3520 [oz_av]

## 2023-07-01 MED ORDER — INSULIN GLARGINE-YFGN 100 UNIT/ML ~~LOC~~ SOLN
7.0000 [IU] | Freq: Every day | SUBCUTANEOUS | Status: DC
  Administered 2023-07-01: 7 [IU] via SUBCUTANEOUS
  Filled 2023-07-01: qty 0.07

## 2023-07-01 MED ORDER — INSULIN GLARGINE-YFGN 100 UNIT/ML ~~LOC~~ SOLN
10.0000 [IU] | Freq: Every day | SUBCUTANEOUS | Status: DC
  Administered 2023-07-02: 10 [IU] via SUBCUTANEOUS
  Filled 2023-07-01: qty 0.1

## 2023-07-01 MED ORDER — IPRATROPIUM-ALBUTEROL 0.5-2.5 (3) MG/3ML IN SOLN: 3.0000 mL | RESPIRATORY_TRACT | Status: DC | PRN

## 2023-07-01 MED ORDER — INSULIN GLARGINE-YFGN 100 UNIT/ML ~~LOC~~ SOLN
3.0000 [IU] | Freq: Once | SUBCUTANEOUS | Status: AC
Start: 2023-07-01 — End: 2023-07-01
  Administered 2023-07-01: 3 [IU] via SUBCUTANEOUS
  Filled 2023-07-01: qty 0.03

## 2023-07-01 MED ORDER — SODIUM CHLORIDE 0.9 % IV SOLN
500.0000 mg | Freq: Every day | INTRAVENOUS | Status: AC
  Administered 2023-07-01 – 2023-07-02 (×2): 500 mg via INTRAVENOUS
  Filled 2023-07-01 (×2): qty 5

## 2023-07-01 MED ORDER — KETOROLAC TROMETHAMINE 30 MG/ML IJ SOLN
30.0000 mg | Freq: Once | INTRAMUSCULAR | Status: AC
  Administered 2023-07-01: 30 mg via INTRAVENOUS
  Filled 2023-07-01: qty 1

## 2023-07-01 MED ORDER — INSULIN ASPART 100 UNIT/ML IJ SOLN
9.0000 [IU] | Freq: Once | INTRAMUSCULAR | Status: AC
  Administered 2023-07-01: 9 [IU] via SUBCUTANEOUS

## 2023-07-01 MED ORDER — INSULIN ASPART 100 UNIT/ML IJ SOLN: 0.0000 [IU] | Freq: Three times a day (TID) | INTRAMUSCULAR | Status: DC

## 2023-07-01 MED ORDER — UMECLIDINIUM-VILANTEROL 62.5-25 MCG/ACT IN AEPB
1.0000 | INHALATION_SPRAY | Freq: Every day | RESPIRATORY_TRACT | Status: DC
  Administered 2023-07-01 – 2023-07-03 (×3): 1 via RESPIRATORY_TRACT
  Filled 2023-07-01: qty 14

## 2023-07-01 MED ORDER — INSULIN ASPART 100 UNIT/ML IJ SOLN
0.0000 [IU] | Freq: Every day | INTRAMUSCULAR | Status: DC
  Administered 2023-07-01: 5 [IU] via SUBCUTANEOUS
  Administered 2023-07-02: 2 [IU] via SUBCUTANEOUS

## 2023-07-01 MED ORDER — INSULIN ASPART 100 UNIT/ML IJ SOLN
0.0000 [IU] | Freq: Three times a day (TID) | INTRAMUSCULAR | Status: DC
  Administered 2023-07-01: 15 [IU] via SUBCUTANEOUS
  Administered 2023-07-02: 5 [IU] via SUBCUTANEOUS
  Administered 2023-07-02 – 2023-07-03 (×2): 2 [IU] via SUBCUTANEOUS
  Administered 2023-07-03: 5 [IU] via SUBCUTANEOUS

## 2023-07-01 MED ORDER — INSULIN ASPART 100 UNIT/ML IJ SOLN: 0.0000 [IU] | Freq: Every day | INTRAMUSCULAR | Status: DC

## 2023-07-01 MED ORDER — GUAIFENESIN 100 MG/5ML PO LIQD
5.0000 mL | ORAL | Status: DC | PRN
  Administered 2023-07-01: 5 mL via ORAL
  Filled 2023-07-01: qty 5

## 2023-07-01 MED ORDER — INSULIN GLARGINE-YFGN 100 UNIT/ML ~~LOC~~ SOLN: 5.0000 [IU] | Freq: Every day | SUBCUTANEOUS | Status: DC

## 2023-07-01 MED ORDER — PERFLUTREN LIPID MICROSPHERE
1.0000 mL | INTRAVENOUS | Status: DC | PRN
  Administered 2023-07-01: 1 mL via INTRAVENOUS

## 2023-07-01 NOTE — Progress Notes (Signed)
Patient refused to wear oxygen when ambulating to the bathroom. Patient states "I'm not out of breath. I am fine. I do not need oxygen." RN stayed at bedside to monitor patient. When Patient returned to bed, her oxygen was 86% on room air. RN titrated oxygen to 3L [place]. Patients oxygen currently at 96%.

## 2023-07-01 NOTE — Assessment & Plan Note (Addendum)
UA with hematuria on arrival. Complains of urine frequency and urgency, but no nitrites or leukocytes, low concern for UTI since her symptoms could also be related to DM. Febrile to 102.8 upon arrival. Given normal Cr, urine production and electrolytes low concern for intrarenal disease. Will CTM and will discuss whether patient is currently menstruating as well.

## 2023-07-01 NOTE — ED Notes (Signed)
Pt reporting chest tightness, states that it feels more like her lungs. EKG recorded. Lung sounds clear. [place] DO notified.

## 2023-07-01 NOTE — Assessment & Plan Note (Addendum)
BG 227 on arrival. No documented hx of diabetes. A1c 6.1 in 2020 and elevated to 9.9. - Moderate SSI - Will discuss home medication regimen options including insulin vs oral

## 2023-07-01 NOTE — Assessment & Plan Note (Addendum)
Patient came in febrile (102.8), tachycardic (120s), tachypneic (28), and hypoxic (70-80s) upon admission and found to be Influenza A+, meeting sepsis criteria and was obtunded. CXR with interstitial prominence, possible bacterial pneumonia. CT head, CTPE, and CT abdomen unremarkable. UA with hematuria. Received CTX and azithromycin x1, and 1L fluid bolus. Procalcitonin negative, will discontinue CTX. - Tylenol 650 q6 prn for fever  - Fu blood culture - consider abx as indicated - CTM fever curve and VS

## 2023-07-01 NOTE — ED Notes (Signed)
Checked patient cbg it was 40 notified RN of blood sugar patient is resting with call bell in reach

## 2023-07-01 NOTE — Assessment & Plan Note (Addendum)
Altered on arrival, but has since improved. AAOx3. VBG with normal pH. Unremarkable CT Head. No electrolyte imbalance noted. Suspect AMS secondary to hypoxia 2/2 URI, with possible THC sedating effects.  - Neuro checks q4  - Fall precautions

## 2023-07-01 NOTE — Plan of Care (Signed)
  Problem: Education: Goal: Ability to describe self-care measures that may prevent or decrease complications (Diabetes Survival Skills Education) will improve Outcome: Progressing Goal: Individualized Educational Video(s) Outcome: Progressing   

## 2023-07-01 NOTE — Assessment & Plan Note (Addendum)
TSH 0.212 with T4 0.78. Possibly in the setting of acute illness.  - CTM - Will advise recheck with PCP s/p discharge

## 2023-07-01 NOTE — Assessment & Plan Note (Addendum)
Patient arrived febrile (102.8), tachycardic (120s), hypoxic (70-80s), and tachypneic (28) found to be Influenza A +. CXR with interstitial prominence. Most concerning for AHRF 2/2 URI. Considering component of COPD as well given smoking history and exam. EKG, BNP, CTPE unremarkable. Received CTX and azithromycin x1 in ED. Carboxyhemoglobin normal for smoking hx. - Anoro Ellipta daily - Duonebs scheduled q4 PRN - Azithromycin (Day 2/3) - Prednisone 40mg  x4 days (1/21-2/24) - Tamiflu 75 BID x 5 days  - Tylenol 650 q6 prn for fevers/pain - Supplement O2 as needed  - Continuous cardiac monitoring  - Droplet precautions - AM BMP, CBC - Toradol x1 for pain - Robitussin PRN

## 2023-07-01 NOTE — Plan of Care (Signed)

## 2023-07-01 NOTE — ED Notes (Signed)
Pt eating food brought by family with soda. Pt educated on better diet choices for diabetes.

## 2023-07-01 NOTE — Progress Notes (Signed)
Pt refuses to wear O2. States she doesn't need it. Pt also refuses bed alarm

## 2023-07-01 NOTE — Progress Notes (Signed)
Daily Progress Note Intern Pager: [phone]  Patient name: Meghan Pruitt Medical record number: [id number] Date of birth: 1980/08/11 Age: 43 y.o. Gender: female  Primary Care Provider: Pcp, No Consultants: None Code Status: Full Code  Pt Overview and Major Events to Date:  1/20: Admitted  Assessment and Plan: Meghan Pruitt is a 43 y.o. female with history of chronic bronchitis presenting with 2 days of cough, chest pain, and fever found to be Flu A positive. Patient arrived altered and hypoxic requiring oxygen. Met sepsis criteria and was started on CTX, azithromycin and IVF. Assessment & Plan Acute hypoxic respiratory failure (HCC) Patient arrived febrile (102.8), tachycardic (120s), hypoxic (70-80s), and tachypneic (28) found to be Influenza A +. CXR with interstitial prominence. Most concerning for AHRF 2/2 URI. Considering component of COPD as well given smoking history and exam. EKG, BNP, CTPE unremarkable. Received CTX and azithromycin x1 in ED. Carboxyhemoglobin normal for smoking hx. - Anoro Ellipta daily - Duonebs scheduled q4 PRN - Azithromycin (Day 2/3) - Prednisone 40mg  x4 days (1/21-2/24) - Tamiflu 75 BID x 5 days  - Tylenol 650 q6 prn for fevers/pain - Supplement O2 as needed  - Continuous cardiac monitoring  - Droplet precautions - AM BMP, CBC - Toradol x1 for pain - Robitussin PRN Diabetes mellitus BG 227 on arrival. No documented hx of diabetes. A1c 6.1 in 2020 and elevated to 9.9. - Moderate SSI - Will discuss home medication regimen options including insulin vs oral Hematuria UA with hematuria on arrival. Complains of urine frequency and urgency, but no nitrites or leukocytes, low concern for UTI since her symptoms could also be related to DM. Febrile to 102.8 upon arrival. Given normal Cr, urine production and electrolytes low concern for intrarenal disease. Will CTM and will discuss whether patient is currently menstruating as well.  Subclinical  hyperthyroidism  Euthyroid Sick Syndrome TSH 0.212 with T4 0.78. Possibly in the setting of acute illness.  - CTM - Will advise recheck with PCP s/p discharge Sepsis (HCC) (Resolved: 07/01/2023) Patient came in febrile (102.8), tachycardic (120s), tachypneic (28), and hypoxic (70-80s) upon admission and found to be Influenza A+, meeting sepsis criteria and was obtunded. CXR with interstitial prominence, possible bacterial pneumonia. CT head, CTPE, and CT abdomen unremarkable. UA with hematuria. Received CTX and azithromycin x1, and 1L fluid bolus. Procalcitonin negative, will discontinue CTX. - Tylenol 650 q6 prn for fever  - Fu blood culture - consider abx as indicated - CTM fever curve and VS  Altered mental status (Resolved: 07/01/2023) Altered on arrival, but has since improved. AAOx3. VBG with normal pH. Unremarkable CT Head. No electrolyte imbalance noted. Suspect AMS secondary to hypoxia 2/2 URI, with possible THC sedating effects.  - Neuro checks q4  - Fall precautions  Chronic and Stable Problems: Unknown  FEN/GI: Carb Modified PPx: Lovenox Dispo: Pending clinical improvement  Subjective:  Patient is getting a nebulizer treatment this morning.  She reports that yesterday she she mainly had difficulty breathing and worsening cough.  She is alert and oriented today and able to understand her medical conditions better.  Daughter was at bedside.  Patient was given Toradol for pain while in the room.  Does have increased urinary frequency and urgency, but no dysuria. UA showed negative leukocytes and nitrites.   Objective: Temp:  [97.9 F (36.6 C)-100.6 F (38.1 C)] 97.9 F (36.6 C) (01/21 1115) Pulse Rate:  [80-121] 80 (01/21 1115) Resp:  [11-47] 21 (01/21 1115) BP: (90-142)/(55-99) 105/82 (  01/21 1115) SpO2:  [91 %-100 %] 100 % (01/21 1115) Physical Exam: General: Awake and alert.  NAD. Cardiovascular: RRR.  No M/R/G.  Respiratory: Coarse breath sounds. Wheezing, no crackles,  rhonchi, or diminished breath sounds. Abdomen: Soft, non-tender, non-distended. Normoactive bowel sounds. Extremities: No BLE edema. Able to move all extremities.   Laboratory: Most recent CBC Lab Results  Component Value Date   WBC 6.8 07/01/2023   HGB 11.9 (L) 07/01/2023   HCT 37.7 07/01/2023   MCV 100.5 (H) 07/01/2023   PLT 163 07/01/2023   Most recent BMP    Latest Ref Rng & Units 07/01/2023   12:30 PM  BMP  Glucose 70 - 99 mg/dL 272   BUN 6 - 20 mg/dL 8   Creatinine 5.36 - 6.44 mg/dL 0.34   Sodium 742 - 595 mmol/L 136   Potassium 3.5 - 5.1 mmol/L 4.5   Chloride 98 - 111 mmol/L 103   CO2 22 - 32 mmol/L 27   Calcium 8.9 - 10.3 mg/dL 8.6     Imaging/Diagnostic Tests: No new imaging.  Fortunato Curling, DO 07/01/2023, 1:36 PM PGY-1, [facility]  FPTS Intern pager: [phone], text pages welcome Secure chat group Meghan Pruitt

## 2023-07-02 ENCOUNTER — Telehealth (HOSPITAL_COMMUNITY): Payer: Self-pay | Admitting: Pharmacy Technician

## 2023-07-02 ENCOUNTER — Other Ambulatory Visit (HOSPITAL_COMMUNITY): Payer: Self-pay

## 2023-07-02 DIAGNOSIS — J111 Influenza due to unidentified influenza virus with other respiratory manifestations: Secondary | ICD-10-CM | POA: Diagnosis not present

## 2023-07-02 DIAGNOSIS — G9340 Encephalopathy, unspecified: Secondary | ICD-10-CM | POA: Diagnosis not present

## 2023-07-02 DIAGNOSIS — J9601 Acute respiratory failure with hypoxia: Secondary | ICD-10-CM | POA: Diagnosis not present

## 2023-07-02 LAB — GLUCOSE, CAPILLARY
Glucose-Capillary: 184 mg/dL — ABNORMAL HIGH (ref 70–99)
Glucose-Capillary: 141 mg/dL — ABNORMAL HIGH (ref 70–99)
Glucose-Capillary: 236 mg/dL — ABNORMAL HIGH (ref 70–99)
Glucose-Capillary: 431 mg/dL — ABNORMAL HIGH (ref 70–99)
Glucose-Capillary: 294 mg/dL — ABNORMAL HIGH (ref 70–99)
Glucose-Capillary: 229 mg/dL — ABNORMAL HIGH (ref 70–99)

## 2023-07-02 LAB — CBC
HCT: 38.9 % (ref 36.0–46.0)
Hemoglobin: 12.6 g/dL (ref 12.0–15.0)
MCH: 31.7 pg (ref 26.0–34.0)
MCHC: 32.4 g/dL (ref 30.0–36.0)
MCV: 98 fL (ref 80.0–100.0)
Platelets: 170 K/uL (ref 150–400)
RBC: 3.97 MIL/uL (ref 3.87–5.11)
RDW: 12.4 % (ref 11.5–15.5)
WBC: 6.6 K/uL (ref 4.0–10.5)
nRBC: 0 % (ref 0.0–0.2)

## 2023-07-02 LAB — BASIC METABOLIC PANEL
Anion gap: 8 (ref 5–15)
BUN: 6 mg/dL (ref 6–20)
CO2: 27 mmol/L (ref 22–32)
Calcium: 8.5 mg/dL — ABNORMAL LOW (ref 8.9–10.3)
Chloride: 103 mmol/L (ref 98–111)
Creatinine, Ser: 0.64 mg/dL (ref 0.44–1.00)
GFR, Estimated: >60 mL/min (ref >60)
Glucose, Bld: 158 mg/dL — ABNORMAL HIGH (ref 70–99)
Potassium: 3.5 mmol/L (ref 3.5–5.1)
Sodium: 138 mmol/L (ref 135–145)

## 2023-07-02 LAB — GLUCOSE, RANDOM: Glucose, Bld: 447 mg/dL — ABNORMAL HIGH (ref 70–99)

## 2023-07-02 MED ORDER — INSULIN ASPART 100 UNIT/ML IJ SOLN
15.0000 [IU] | Freq: Once | INTRAMUSCULAR | Status: AC
  Administered 2023-07-02: 15 [IU] via SUBCUTANEOUS

## 2023-07-02 MED ORDER — INSULIN GLARGINE-YFGN 100 UNIT/ML ~~LOC~~ SOLN
5.0000 [IU] | Freq: Once | SUBCUTANEOUS | Status: AC
  Administered 2023-07-02: 5 [IU] via SUBCUTANEOUS
  Filled 2023-07-02: qty 0.05

## 2023-07-02 MED ORDER — INSULIN GLARGINE-YFGN 100 UNIT/ML ~~LOC~~ SOLN
15.0000 [IU] | Freq: Every day | SUBCUTANEOUS | Status: DC
Start: 2023-07-03 — End: 2023-07-03
  Administered 2023-07-03: 15 [IU] via SUBCUTANEOUS
  Filled 2023-07-02: qty 0.15

## 2023-07-02 MED ORDER — LIVING WELL WITH DIABETES BOOK
Freq: Once | Status: AC
  Filled 2023-07-02: qty 1

## 2023-07-02 MED ORDER — INSULIN GLARGINE-YFGN 100 UNIT/ML ~~LOC~~ SOLN
5.0000 [IU] | Freq: Once | SUBCUTANEOUS | Status: DC
  Filled 2023-07-02: qty 0.05

## 2023-07-02 NOTE — Assessment & Plan Note (Signed)
Altered upon arrival likely secondary to hypoxia.

## 2023-07-02 NOTE — Plan of Care (Signed)
Refused to have oxygen support when awake. Place her on O2 support when she is asleep and she agreed. Calm and cooperation throughout this time.  Problem: Education: Goal: Ability to describe self-care measures that may prevent or decrease complications (Diabetes Survival Skills Education) will improve Outcome: Progressing   Problem: Coping: Goal: Ability to adjust to condition or change in health will improve Outcome: Progressing   Problem: Nutritional: Goal: Maintenance of adequate nutrition will improve Outcome: Progressing   Problem: Activity: Goal: Risk for activity intolerance will decrease Outcome: Progressing   Problem: Clinical Measurements: Goal: Respiratory complications will improve Outcome: Progressing

## 2023-07-02 NOTE — Evaluation (Signed)
Occupational Therapy Evaluation Patient Details Name: Meghan Pruitt MRN: [id number] DOB: 06-17-1980 Today's Date: 07/02/2023   History of Present Illness Meghan Pruitt is a 43 y.o. female with history of chronic bronchitis presenting with 2 days of cough, chest pain, and fever found to be Flu A positive. Patient arrived altered and hypoxic requiring oxygen. Met sepsis criteria and was started on CTX, azithromycin and IVF. Limited PMH on file. Pt with no hx of DM. A1C 6.1 in 2020 and elevated to 9.9.   Clinical Impression   Pt reports PTA, she was independent with ADL/IADL and functional mobility. Pt currently requires supervision to contact guard assistance for ambulation without AD and for stability during ADL task. Pt noted to have 1x loss of balance during session requiring assistance from therapist to correct. Pt tolerated OOB ADL for about on RA with SpO2 92% with exertion, HR 110bpm with exertion, DoE 3/4. 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 Pt will benefit from continued skilled OT services to address activity tolerance, balance and overall independence with ADL/IADL and mobility. Do not anticipate pt to require follow-up OT services after d/c. Will continue to follow acutely and progress appropriately.        If plan is discharge home, recommend the following: A little help with walking and/or transfers    Functional Status Assessment  Patient has had a recent decline in their functional status and demonstrates the ability to make significant improvements in function in a reasonable and predictable amount of time.  Equipment Recommendations  None recommended by OT    Recommendations for Other Services       Precautions / Restrictions Precautions Precautions: Fall Restrictions Weight Bearing Restrictions Per Provider Order: No      Mobility Bed Mobility Overal bed mobility: Modified Independent                   Transfers Overall transfer level: Needs assistance Equipment used: None Transfers: Sit to/from Stand Sit to Stand: Supervision           General transfer comment: supervision for safety minor instability noted      Balance Overall balance assessment: Needs assistance Sitting-balance support: No upper extremity supported, Feet supported Sitting balance-Leahy Scale: Good     Standing balance support: No upper extremity supported, During functional activity Standing balance-Leahy Scale: Fair Standing balance comment: 1x loss of balance during ambulation, pt required assistance from therapist to correct. minor instability noted during session but improved as ambulation lengthened                           ADL either performed or assessed with clinical judgement   ADL Overall ADL's : Needs assistance/impaired Eating/Feeding: Independent   Grooming: Supervision/safety Grooming Details (indicate cue type and reason): safety while engaging in dynamic stability activity Upper Body Bathing: Modified independent;Sitting   Lower Body Bathing: Modified independent;Sitting/lateral leans   Upper Body Dressing : Modified independent   Lower Body Dressing: Supervision/safety Lower Body Dressing Details (indicate cue type and reason): supervision with standing Toilet Transfer: Contact guard assist Toilet Transfer Details (indicate cue type and reason): 1x minor loss of balance pt stated "i don't know where that came from" Toileting- Clothing Manipulation and Hygiene: Supervision/safety       Functional mobility during ADLs: Supervision/safety General ADL Comments: supervision for safety and stability pt with 1x minor loss of balance require  assistance from therapist to correct     Vision         Perception         Praxis         Pertinent Vitals/Pain Pain Assessment Pain Assessment: 0-10 Pain Score: 8  Pain Location: chest when coughing Pain Descriptors /  Indicators: Sore Pain Intervention(s): Monitored during session     Extremity/Trunk Assessment Upper Extremity Assessment Upper Extremity Assessment: Overall WFL for tasks assessed   Lower Extremity Assessment Lower Extremity Assessment: Overall WFL for tasks assessed   Cervical / Trunk Assessment Cervical / Trunk Assessment: Normal   Communication Communication Communication: No apparent difficulties   Cognition Arousal: Alert Behavior During Therapy: WFL for tasks assessed/performed Overall Cognitive Status: Within Functional Limits for tasks assessed                                       General Comments  pt on RA during session SpO2 >93% with exertion, 95% on RA at rest    Exercises     Shoulder Instructions      Home Living Family/patient expects to be discharged to:: Private residence Living Arrangements: Children Available Help at Discharge: Family;Available PRN/intermittently Type of Home: House Home Access: Ramped entrance     Home Layout: One level     Bathroom Shower/Tub: Chief Strategy Officer: Standard     Home Equipment: Agricultural consultant (2 wheels);BSC/3in1   Additional Comments: has two children 16y.o and 18y.o      Prior Functioning/Environment Prior Level of Function : Independent/Modified Independent               ADLs Comments: pt was a pca working 7 days/week 2hrs perday, no heavy lifting required        OT Problem List: Impaired balance (sitting and/or standing);Decreased activity tolerance      OT Treatment/Interventions: Energy conservation;DME and/or AE instruction;Self-care/ADL training;Therapeutic activities;Balance training;Patient/family education    OT Goals(Current goals can be found in the care plan section) Acute Rehab OT Goals Patient Stated Goal: to go home OT Goal Formulation: With patient Time For Goal Achievement: 07/16/23 Potential to Achieve Goals: Good ADL Goals Pt Will  Transfer to Toilet: Independently;ambulating Additional ADL Goal #1: Pt will engage in ADL for 15-49min with stable vital signs. Additional ADL Goal #2: Pt will demonstrate good balance with dynamic stability task during IADL.  OT Frequency: Min 1X/week    Co-evaluation              AM-PAC OT "6 Clicks" Daily Activity     Outcome Measure Help from another person eating meals?: None Help from another person taking care of personal grooming?: A Little Help from another person toileting, which includes using toliet, bedpan, or urinal?: A Little Help from another person bathing (including washing, rinsing, drying)?: None Help from another person to put on and taking off regular upper body clothing?: None Help from another person to put on and taking off regular lower body clothing?: None 6 Click Score: 22   End of Session Equipment Utilized During Treatment: Gait belt Nurse Communication: Mobility status  Activity Tolerance: Patient tolerated treatment well Patient left: in bed;with call bell/phone within reach;with bed alarm set  OT Visit Diagnosis: Other abnormalities of gait and mobility (R26.89)                Time: 9562-1308 OT Time Calculation (min):  24 min Charges:  OT General Charges $OT Visit: 1 Visit OT Evaluation $OT Eval Low Complexity: 1 Low OT Treatments $Self Care/Home Management : 8-22 mins  Rosey Bath OTR/L Acute Rehabilitation Services Office: [phone]   Providence Crosby 07/02/2023, 3:51 PM

## 2023-07-02 NOTE — Inpatient Diabetes Management (Signed)
Inpatient Diabetes Program Recommendations  AACE/ADA: New Consensus Statement on Inpatient Glycemic Control (2015)  Target Ranges:  Prepandial:   less than 140 mg/dL      Peak postprandial:   less than 180 mg/dL (1-2 hours)      Critically ill patients:  140 - 180 mg/dL   Lab Results  Component Value Date   GLUCAP 431 (H) 07/02/2023   HGBA1C 9.9 (H) 06/30/2023    Review of Glycemic Control  Diabetes history: New Diagnosis Diabetes Current orders for Inpatient glycemic control:  Semglee 15 units Novolog 0-15 units tid + hs  A1c 9.9% this admission  Benefits check on insulin and CGMs: Semglee copay is $4.00, Dexcom G7 not covered, Freestyle LIbre 3 not covered  Observed high carb food choices on tray that were not eaten such as ice cream and chocolate cake -   Pt may benefit from a 70/30 insulin that covers meal intake at time of d/c  Considering FSL3 sample from office at time of d/c.  Spoke with patient about new diabetes diagnosis.  Discussed A1C results (9.9%) and explained what an A1C is. Discussed basic pathophysiology of DM Type 2, basic home care, importance of checking CBGs and maintaining good CBG control to prevent long-term and short-term complications. Reviewed glucose and A1C goals.  Reviewed signs and symptoms of hyperglycemia and hypoglycemia along with treatment for both. Discussed impact of nutrition, exercise, stress, sickness, and medications on diabetes control. Reviewed Living Well with diabetes booklet and encouraged patient to read through entire book. Informed patient that she will be prescribed insulin. Demonstrated the use of the insulin pen. Will review with pt in the am. Asked patient to check her glucose 2 times per day and to keep a log book of glucose readings and insulin taken. Explained how the doctor he follows up with can use the log book to continue to make insulin adjustments if needed. Patient verbalized understanding of information discussed and has  no further questions at this time related to diabetes.   RNs to provide ongoing basic DM education at bedside with this patient and engage patient to actively check blood glucose and administer insulin injections.   Thanks,   Christena Deem RN, MSN, BC-ADM Inpatient Diabetes Coordinator Team Pager [phone] (8a-5p)

## 2023-07-02 NOTE — Assessment & Plan Note (Signed)
Febrile on admission and hypoxic. Found to have Influenza A+. - Tamiflu 75 BID x 5 days (1/20-1/24) - Tylenol 650 q6 prn for fevers/pain - Droplet precautions - Robitussin PRN - Supportive care

## 2023-07-02 NOTE — Assessment & Plan Note (Addendum)
Patient arrived febrile (102.8), tachycardic (120s), hypoxic (70-80s), and tachypneic (28) found to be Influenza A +. CXR with interstitial prominence. Most concerning for AHRF 2/2 URI. Considering component of COPD as well given smoking history and exam. EKG, BNP, CTPE unremarkable. Received CTX and azithromycin x1 in ED. Carboxyhemoglobin normal for smoking hx. - Anoro Ellipta daily - Duonebs scheduled q4 PRN - Azithromycin (Day 3/3) - Discontinue Prednisone  - Supplement O2 as needed  - Continuous cardiac monitoring  - AM BMP - PT/OT - Ambulatory pulse ox

## 2023-07-02 NOTE — Assessment & Plan Note (Addendum)
TSH 0.212 with T4 0.78. Possibly in the setting of acute illness.  - Will advise recheck with PCP s/p discharge

## 2023-07-02 NOTE — Progress Notes (Signed)
Daily Progress Note Intern Pager: [phone]  Patient name: Meghan Pruitt Medical record number: [id number] Date of birth: 30-Aug-1980 Age: 43 y.o. Gender: female  Primary Care Provider: Pcp, No Consultants: None Code Status: Full Code   Pt Overview and Major Events to Date:  1/20: Admitted  Assessment and Plan: Meghan Pruitt is a 43 y.o. female with history of chronic bronchitis presenting with 2 days of cough, chest pain, and fever found to be Flu A positive. Patient arrived altered and hypoxic requiring oxygen. Met sepsis criteria and was started on CTX, azithromycin and IVF.  Assessment & Plan Acute hypoxic respiratory failure (HCC) Patient arrived febrile (102.8), tachycardic (120s), hypoxic (70-80s), and tachypneic (28) found to be Influenza A +. CXR with interstitial prominence. Most concerning for AHRF 2/2 URI. Considering component of COPD as well given smoking history and exam. EKG, BNP, CTPE unremarkable. Received CTX and azithromycin x1 in ED. Carboxyhemoglobin normal for smoking hx. - Anoro Ellipta daily - Duonebs scheduled q4 PRN - Azithromycin (Day 3/3) - Discontinue Prednisone  - Supplement O2 as needed  - Continuous cardiac monitoring  - AM BMP - PT/OT - Ambulatory pulse ox Diabetes mellitus BG 227 on arrival. No documented hx of diabetes. A1c 6.1 in 2020 and elevated to 9.9. Started Semglee 10u and moderate SSI after discussing with patient's comfort with doing insulin at home.  - Insulin regimen: 10 units LAI - Moderate SSI AC - Could add additional 5u LAI if glucoses > 350 Influenza Febrile on admission and hypoxic. Found to have Influenza A+. - Tamiflu 75 BID x 5 days (1/20-1/24) - Tylenol 650 q6 prn for fevers/pain - Droplet precautions - Robitussin PRN - Supportive care Hematuria UA with hematuria on arrival due to current menstrual cycle. Complains of urine frequency and urgency, but no nitrites or leukocytes, low concern for UTI since her symptoms  could also be related to DM.  Subclinical hyperthyroidism  Euthyroid Sick Syndrome TSH 0.212 with T4 0.78. Possibly in the setting of acute illness.  - Will advise recheck with PCP s/p discharge Encephalopathy (Resolved: 07/02/2023) Altered upon arrival likely secondary to hypoxia.   Chronic and Stable Problems: unknown  FEN/GI: Carb Modified PPx: Lovenox Dispo: Pending clinical improvement  Subjective:  Patient is doing well this morning eating breakfast.  She was eating pancakes with eggs and bacon.  We rediscussed moderation of her diet and how to best control her diabetes.  She understands and sister agrees at bedside.  No other concerns this morning.  Continues to cough and has myalgias, but has noticed improvement.  Objective: Temp:  [98.4 F (36.9 C)-98.9 F (37.2 C)] 98.4 F (36.9 C) (01/22 1113) Pulse Rate:  [87-105] 95 (01/22 1113) Resp:  [17-22] 18 (01/22 1113) BP: (99-128)/(62-101) 124/96 (01/22 1113) SpO2:  [95 %-100 %] 96 % (01/22 1113) Physical Exam: General: Awake and alert.  NAD. Cardiovascular: RRR.  No M/R/G.  Respiratory: CTAB.  Normal WOB on RA.  No wheezing, crackles, rhonchi or diminished breath sounds. Abdomen: Soft, non-tender, non-distended. Normoactive bowel sounds. Extremities: No BLE edema. Able to move all extremities.   Laboratory: Most recent CBC Lab Results  Component Value Date   WBC 6.6 07/02/2023   HGB 12.6 07/02/2023   HCT 38.9 07/02/2023   MCV 98.0 07/02/2023   PLT 170 07/02/2023   Most recent BMP    Latest Ref Rng & Units 07/02/2023    5:36 AM  BMP  Glucose 70 - 99 mg/dL 347   BUN  6 - 20 mg/dL 6   Creatinine 2.13 - 0.86 mg/dL 5.78   Sodium 469 - 629 mmol/L 138   Potassium 3.5 - 5.1 mmol/L 3.5   Chloride 98 - 111 mmol/L 103   CO2 22 - 32 mmol/L 27   Calcium 8.9 - 10.3 mg/dL 8.5     Imaging/Diagnostic Tests: No new imaging.  Fortunato Curling, DO 07/02/2023, 12:42 PM PGY-1, [facility]  FPTS Intern pager:  [phone], text pages welcome Secure chat group [facility] Teaching Service

## 2023-07-02 NOTE — Telephone Encounter (Addendum)
 ERROR

## 2023-07-02 NOTE — Assessment & Plan Note (Addendum)
BG 227 on arrival. No documented hx of diabetes. A1c 6.1 in 2020 and elevated to 9.9. Started Semglee 10u and moderate SSI after discussing with patient's comfort with doing insulin at home.  - Insulin regimen: 10 units LAI - Moderate SSI AC - Could add additional 5u LAI if glucoses > 350

## 2023-07-02 NOTE — Assessment & Plan Note (Addendum)
UA with hematuria on arrival due to current menstrual cycle. Complains of urine frequency and urgency, but no nitrites or leukocytes, low concern for UTI since her symptoms could also be related to DM.

## 2023-07-02 NOTE — TOC CAGE-AID Note (Signed)
Transition of Care [facility]) - CAGE-AID Screening   Patient Details  Name: Meghan Pruitt MRN: [id number] Date of Birth: 1980-12-02  Transition of Care [facility]) CM/SW Contact:    Kermit Balo, RN Phone Number: 07/02/2023, 11:45 AM   Clinical Narrative:  Pt denies the need for inpatient/ outpatient counseling resources   CAGE-AID Screening: Substance Abuse Screening unable to be completed due to: : Patient Refused  Have You Ever Felt You Ought to Cut Down on Your Drinking or Drug Use?: Yes Have People Annoyed You By Critizing Your Drinking Or Drug Use?: No Have You Felt Bad Or Guilty About Your Drinking Or Drug Use?: No Have You Ever Had a Drink or Used Drugs First Thing In The Morning to Steady Your Nerves or to Get Rid of a Hangover?: No CAGE-AID Score: 1

## 2023-07-02 NOTE — Plan of Care (Signed)

## 2023-07-02 NOTE — TOC Initial Note (Signed)
Transition of Care [facility]) - Initial/Assessment Note    Patient Details  Name: Meghan Pruitt MRN: [id number] Date of Birth: 1981/05/05  Transition of Care [facility]) CM/SW Contact:    Kermit Balo, RN Phone Number: 07/02/2023, 1:03 PM  Clinical Narrative:                  Pt is from home with her son and daughter. She states someone is with her most of the time.  No DME at home. She wasn't taking any prescription medications at home. She denies issues with transportation.  Pt is without a PCP. She was in agreement to have CM assist in finding one. CM has added appointment to AVS.  TOC following.  Expected Discharge Plan: Home/Self Care Barriers to Discharge: Continued Medical Work up   Patient Goals and CMS Choice            Expected Discharge Plan and Services   Discharge Planning Services: CM Consult   Living arrangements for the past 2 months: Single Family Home                                      Prior Living Arrangements/Services Living arrangements for the past 2 months: Single Family Home Lives with:: Adult Children Patient language and need for interpreter reviewed:: Yes Do you feel safe going back to the place where you live?: Yes        Care giver support system in place?: Yes (comment)   Criminal Activity/Legal Involvement Pertinent to Current Situation/Hospitalization: No - Comment as needed  Activities of Daily Living   ADL Screening (condition at time of admission) Independently performs ADLs?: Yes (appropriate for developmental age) Is the patient deaf or have difficulty hearing?: Yes (L ear) Does the patient have difficulty seeing, even when wearing glasses/contacts?: No Does the patient have difficulty concentrating, remembering, or making decisions?: No  Permission Sought/Granted                  Emotional Assessment Appearance:: Appears stated age Attitude/Demeanor/Rapport: Engaged Affect (typically observed):  Accepting Orientation: : Oriented to Self, Oriented to Place, Oriented to  Time, Oriented to Situation   Psych Involvement: No (comment)  Admission diagnosis:  Influenza [J11.1] Sepsis (HCC) [A41.9] Encephalopathy, unspecified type [G93.40] Patient Active Problem List   Diagnosis Date Noted   Subclinical hyperthyroidism  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   Influenza 07/01/2023   Acute hypoxic respiratory failure (HCC) 06/30/2023   Hematuria 06/30/2023   Diabetes mellitus 06/30/2023   Otalgia of left ear 12/29/2020   Mastoiditis, acute 09/27/2018   Otitis externa 09/27/2018   Otitis media 09/27/2018   Obesity, Class III, BMI 40-49.9 (morbid obesity) (HCC) 09/27/2018   Post term pregnancy at [redacted] weeks gestation 01/26/2017   SVD (spontaneous vaginal delivery) 01/26/2017   Breast abscess during pregnancy, antepartum 01/09/2017   Supervision of other normal pregnancy, antepartum 11/25/2016   Late prenatal care affecting pregnancy in third trimester 11/25/2016   Bacterial vaginosis 09/14/2016   PCP:  Pcp, No Pharmacy:   [id number] [place], [place] - [address] [place] [address] [place] [address] Phone: [phone] Fax: [phone]  [facility] - [place], [place] - [address] [place] [address] Phone: [phone] Fax: [phone]  [id number] - [place], [place] - [address] AT [facility] [address] [place] [address] Phone: [phone] Fax: [phone]     Social Drivers of Health (SDOH) Social History: SDOH Screenings   Food Insecurity: Patient Declined (06/30/2023)  Housing: Patient Declined (06/30/2023)  Transportation Needs: Patient Declined (06/30/2023)  Utilities: Patient Declined (06/30/2023)  Tobacco Use: High Risk (06/30/2023)   SDOH Interventions:     Readmission Risk Interventions     No data to display

## 2023-07-03 ENCOUNTER — Other Ambulatory Visit (HOSPITAL_COMMUNITY): Payer: Self-pay

## 2023-07-03 DIAGNOSIS — J9601 Acute respiratory failure with hypoxia: Secondary | ICD-10-CM | POA: Diagnosis not present

## 2023-07-03 LAB — BASIC METABOLIC PANEL
Anion gap: 8 (ref 5–15)
BUN: <5 mg/dL — ABNORMAL LOW (ref 6–20)
CO2: 31 mmol/L (ref 22–32)
Calcium: 8.5 mg/dL — ABNORMAL LOW (ref 8.9–10.3)
Chloride: 98 mmol/L (ref 98–111)
Creatinine, Ser: 0.64 mg/dL (ref 0.44–1.00)
GFR, Estimated: >60 mL/min (ref >60)
Glucose, Bld: 131 mg/dL — ABNORMAL HIGH (ref 70–99)
Potassium: 2.8 mmol/L — ABNORMAL LOW (ref 3.5–5.1)
Sodium: 137 mmol/L (ref 135–145)

## 2023-07-03 LAB — GLUCOSE, CAPILLARY
Glucose-Capillary: 150 mg/dL — ABNORMAL HIGH (ref 70–99)
Glucose-Capillary: 210 mg/dL — ABNORMAL HIGH (ref 70–99)

## 2023-07-03 MED ORDER — INSULIN GLARGINE-YFGN 100 UNIT/ML ~~LOC~~ SOPN
15.0000 [IU] | PEN_INJECTOR | Freq: Every day | SUBCUTANEOUS | 0 refills | Status: DC
  Filled 2023-07-03 (×2): qty 6, 40d supply, fill #0

## 2023-07-03 MED ORDER — ACETAMINOPHEN 325 MG PO TABS: 650.0000 mg | ORAL_TABLET | Freq: Four times a day (QID) | ORAL | Status: AC | PRN

## 2023-07-03 MED ORDER — OSELTAMIVIR PHOSPHATE 75 MG PO CAPS
75.0000 mg | ORAL_CAPSULE | Freq: Two times a day (BID) | ORAL | 0 refills | Status: DC
  Filled 2023-07-03: qty 4, 2d supply, fill #0

## 2023-07-03 MED ORDER — UMECLIDINIUM-VILANTEROL 62.5-25 MCG/ACT IN AEPB
1.0000 | INHALATION_SPRAY | Freq: Every day | RESPIRATORY_TRACT | 0 refills | Status: DC
  Filled 2023-07-03: qty 60, 30d supply, fill #0

## 2023-07-03 MED ORDER — MEDROXYPROGESTERONE ACETATE 150 MG/ML IM SUSP
150.0000 mg | Freq: Once | INTRAMUSCULAR | Status: AC
  Administered 2023-07-03: 150 mg via INTRAMUSCULAR
  Filled 2023-07-03: qty 1

## 2023-07-03 MED ORDER — FINGERSTIX LANCETS MISC
1.0000 | Freq: Three times a day (TID) | 0 refills | Status: DC
  Filled 2023-07-03: qty 100, 34d supply, fill #0

## 2023-07-03 MED ORDER — LANCET DEVICE MISC
1.0000 | Freq: Three times a day (TID) | 0 refills | Status: AC
  Filled 2023-07-03: qty 1, 30d supply, fill #0

## 2023-07-03 MED ORDER — BLOOD GLUCOSE MONITOR SYSTEM W/DEVICE KIT
1.0000 | PACK | Freq: Three times a day (TID) | 0 refills | Status: AC
  Filled 2023-07-03: qty 1, 30d supply, fill #0

## 2023-07-03 MED ORDER — INSULIN PEN NEEDLE 32G X 4 MM MISC
0 refills | Status: DC
  Filled 2023-07-03: qty 100, 90d supply, fill #0

## 2023-07-03 MED ORDER — BLOOD GLUCOSE TEST VI STRP
1.0000 | ORAL_STRIP | Freq: Three times a day (TID) | 0 refills | Status: AC
  Filled 2023-07-03: qty 100, 30d supply, fill #0

## 2023-07-03 MED ORDER — POTASSIUM CHLORIDE CRYS ER 20 MEQ PO TBCR
40.0000 meq | EXTENDED_RELEASE_TABLET | ORAL | Status: DC
  Administered 2023-07-03: 40 meq via ORAL
  Filled 2023-07-03: qty 2

## 2023-07-03 NOTE — Assessment & Plan Note (Addendum)
UA with hematuria on arrival due to current menstrual cycle. Complains of urine frequency and urgency, but no nitrites or leukocytes, low concern for UTI since her symptoms could also be related to DM.

## 2023-07-03 NOTE — Inpatient Diabetes Management (Signed)
Inpatient Diabetes Program Recommendations  AACE/ADA: New Consensus Statement on Inpatient Glycemic Control (2015)  Target Ranges:  Prepandial:   less than 140 mg/dL      Peak postprandial:   less than 180 mg/dL (1-2 hours)      Critically ill patients:  140 - 180 mg/dL   Lab Results  Component Value Date   GLUCAP 210 (H) 07/03/2023   HGBA1C 9.9 (H) 06/30/2023    Review of Glycemic Control  Latest Reference Range & Units 07/03/23 06:04 07/03/23 11:18  Glucose-Capillary 70 - 99 mg/dL 478 (H) 295 (H)   Diabetes history: New Diagnosis Diabetes Current orders for Inpatient glycemic control:  Semglee 15 units Novolog 0-15 units tid + hs  A1c 9.9% this admission  -    FSL3 sample from office given and applied to pt upper left arm.  Spoke with patient at bedside to review operation of insulin pen and answer any questions related to her new diabetes diagnosis.  Thanks,   Christena Deem RN, MSN, BC-ADM Inpatient Diabetes Coordinator Team Pager [phone] (8a-5p)

## 2023-07-03 NOTE — TOC Transition Note (Signed)
Transition of Care [facility]) - Discharge Note   Patient Details  Name: Meghan Pruitt MRN: [id number] Date of Birth: 11/04/1980  Transition of Care [facility]) CM/SW Contact:  Kermit Balo, RN Phone Number: 07/03/2023, 11:52 AM   Clinical Narrative:     Pt is discharging home with self care. No follow up per therapies.  PCP appointment on AVS.  Pt has transportation home.   Final next level of care: Home/Self Care Barriers to Discharge: No Barriers Identified   Patient Goals and CMS Choice            Discharge Placement                       Discharge Plan and Services Additional resources added to the After Visit Summary for     Discharge Planning Services: CM Consult                                 Social Drivers of Health (SDOH) Interventions SDOH Screenings   Food Insecurity: Patient Declined (06/30/2023)  Housing: Patient Declined (06/30/2023)  Transportation Needs: Patient Declined (06/30/2023)  Utilities: Patient Declined (06/30/2023)  Tobacco Use: High Risk (06/30/2023)     Readmission Risk Interventions     No data to display

## 2023-07-03 NOTE — Discharge Summary (Addendum)
Family Medicine Teaching [facility] Discharge Summary  Patient name: Meghan Pruitt Medical record number: [id number] Date of birth: [birth year] Age: 43 y.o. Gender: female Date of Admission: 06/30/2023  Date of Discharge: 07/03/23  Admitting Physician: Caro Laroche, DO  Primary Care Provider: Pcp, No Consultants: None  Indication for Hospitalization: Hypoxia and AMS  Discharge Diagnoses/Problem List:  Principal Problem for Admission: Acute Hypoxic Respiratory Failure Other Problems addressed during stay:  Principal Problem:   Acute hypoxic respiratory failure (HCC) Active Problems:   Diabetes mellitus   Subclinical hyperthyroidism  Euthyroid Sick Syndrome   Influenza  Brief Hospital Course:  LUCEIL DANSKY is a 43 y.o.female with a history of chronic bronchitis who was admitted to the [facility] Teaching Service at [facility] for hypoxic respiratory failure. Her hospital course is detailed below:  Flu A  Acute Hypoxic Respiratory Failure Presented to ED for 2 days of congestion and is altered on admission. Found to have Flu A, started on oxygen therapy and Tamiflu. In ED, EKG unremarkable, Trops negative, BNP wnl. CXR with interstitial prominence. CT head, CTPE, and CT abdomen unremarkable. UA with hematuria. UDS positive for cocaine and THC. Patient had gradual improvement of respiratory status, was also started on prednisone for anti-inflammatory properties. Echo demonstrated LVEF 60-65%, no regional wall motion abnormalities, and IVC with greater than 50% respiratory variability. Hypoxic likely due to undiagnosed COPD with smoking hx vs flu symptoms. This improved upon discharge and she was able to maintain saturations in the 90s while ambulating.  Acute Encephalopathy  Improved over course of admission. Likely 2/2 hypoxia and acute illness. Suspect component of sleep apnea and polysubstance use as well (cocaine and THC+). Pt was at baseline mentation on discharge.  AKI Likely  pre-renal in setting of dehydration. Returned to baseline prior to discharge.  New onset T2DM A1C 9. Added SSI and started her on Semglee 15 units. Discharged with 15 units of LAI.   Contraception  Patient received 1 dose of DepoProvera before discharge. Recommend close PCP follow-up.   Other chronic conditions were medically managed with home medications and formulary alternatives as necessary  PCP Follow-up Recommendations: Recheck UA as outpatient given hematuria Recheck TSH as outpatient (TSH low, T4 normal here) Recommend assessment for OSA Patient with NEW ONSET diabetes and BMI 38, would be a good candidate for Ozempic/GLP-1 Titrate insulin as needed based on BGs  Recommend PFTs outpatient. Repeat BMP at f/u (hypokalemia one day) Discuss long term contraception plan  Disposition: Home  Discharge Condition: Stable  Discharge Exam:  Vitals:   07/03/23 0824 07/03/23 1119  BP:  108/86  Pulse:  98  Resp:  19  Temp:  98.3 F (36.8 C)  SpO2: 96% 96%   General: Awake and Alert in NAD HEENT: NCAT. Sclera anicteric. No rhinorrhea. Cardiovascular: RRR. No M/R/G Respiratory: CTAB, normal WOB on RA. No wheezing, crackles, rhonchi, or diminished breath sounds. Abdomen: Soft, non-tender, non-distended. Bowel sounds normoactive. Extremities: Able to move all extremities equally. No BLE edema, no deformities or significant joint findings. Skin: Warm and dry. No abrasions or rashes noted. Neuro: No focal neurological deficits.  Significant Procedures: None  Significant Labs and Imaging:  Recent Labs  Lab 07/02/23 0536  WBC 6.6  HGB 12.6  HCT 38.9  PLT 170   Recent Labs  Lab 07/02/23 0536 07/02/23 1709 07/03/23 0614  NA 138  --  137  K 3.5  --  2.8*  CL 103  --  98  CO2 27  --  31  GLUCOSE 158* 447* 131*  BUN 6  --  <5*  CREATININE 0.64  --  0.64  CALCIUM 8.5*  --  8.5*    CXR 1. Low lung volumes. Bilateral interstitial prominence could reflect  bronchovascular crowding, interstitial edema, or atypical infection. 2. Streaky left basilar opacities may represent infiltrate, asymmetric edema, or atelectasis.  CTA PE 1. Decreased sensitivity and specificity for detailed findings due to motion artifact. No evidence of pulmonary embolism to the level of the proximal lobar arteries or other acute intrathoracic process. 2. No acute abdominopelvic findings. 3. Moderate fat-containing ventral midline supraumbilical hernia with mild mesenteric stranding deep to the herniation. Recommend correlation with physical examination. 4. Small fat containing right inguinal hernia.  CT Abdomen Pelvis w/ contrast 1. Decreased sensitivity and specificity for detailed findings due to motion artifact. No evidence of pulmonary embolism to the level of the proximal lobar arteries or other acute intrathoracic process. 2. No acute abdominopelvic findings. 3. Moderate fat-containing ventral midline supraumbilical hernia with mild mesenteric stranding deep to the herniation. Recommend correlation with physical examination. 4. Small fat containing right inguinal hernia.  Results/Tests Pending at Time of Discharge: none  Discharge Medications:  Allergies as of 07/03/2023   No Known Allergies      Medication List     TAKE these medications    acetaminophen 325 MG tablet Commonly known as: TYLENOL Take 2 tablets (650 mg total) by mouth every 6 (six) hours as needed for mild pain (pain score 1-3), fever or moderate pain (pain score 4-6).   Blood Glucose Monitor System w/Device Kit Use in the morning, at noon, and at bedtime.   BLOOD GLUCOSE TEST STRIPS Strp Use in the morning, at noon, and at bedtime.   Fingerstix Lancets Misc Use in the morning, at noon, and at bedtime.   insulin glargine-yfgn 100 UNIT/ML Pen Commonly known as: SEMGLEE Inject 15 Units into the skin daily.   Lancet Device Misc 1 each by Does not apply route in the morning, at noon,  and at bedtime.   naproxen sodium 220 MG tablet Commonly known as: ALEVE Take 880 mg by mouth daily as needed (pain, headache).   oseltamivir 75 MG capsule Commonly known as: TAMIFLU Take 1 capsule (75 mg total) by mouth 2 (two) times daily.   umeclidinium-vilanterol 62.5-25 MCG/ACT Aepb Commonly known as: ANORO ELLIPTA Inhale 1 puff into the lungs daily. Start taking on: July 04, 2023        Discharge Instructions: Please refer to Patient Instructions section of EMR for full details.  Patient was counseled important signs and symptoms that should prompt return to medical care, changes in medications, dietary instructions, activity restrictions, and follow up appointments.   Follow-Up Appointments:  Follow-up Information     Latrelle Dodrill, MD Follow up on 07/14/2023.   Specialty: Family Medicine Why: at 9:50 AM Contact information: [address] [place] [address] [phone]                 Fortunato Curling, DO 07/03/2023, 1:04 PM PGY-1, [place] [facility]   Upper Level Addendum:  I have seen and evaluated this patient along with Dr. Fatima Blank and reviewed the above note, making necessary revisions as appropriate.  I agree with the medical decision making and physical exam as noted above.  Levin Erp, MD PGY-3 [facility] Family Medicine Residency

## 2023-07-03 NOTE — Discharge Instructions (Addendum)
Dear Meghan Pruitt,  Thank you for letting us participate in your care. You were hospitalized for low oxygen levels and being altered due to this and diagnosed with Acute hypoxic respiratory failure (HCC). You were treated with antibiotics initially and nebulizer treatments and steroids to help improve your wheezing and breathing. This improved prior to discharged.   You were also diagnosed with Diabetes while in the hospital with an elevated A1c to 9.9%. We started you on insulin to help manage your diabetes and discussed the best way to live with diabetes and how to manage it with your diet and exercise. Please ensure that you follow up with your new primary care doctor so they are aware of your medical conditions and can help titrate your medications as needed. Please take your medications as prescribed.  POST-HOSPITAL & CARE INSTRUCTIONS If you anxious, dizzy, or confused please make sure you are staying hydrated and eating appropriately. Please make sure you check your sugars as well to ensure you are not hypoglycemic (low blood sugar).  If you are excessively urinating, thirsty, or dehydrated. Please ensure you check your sugars and are taking your insulin as prescribed to ensure that you are not hyperglycemic (high blood sugar).  Go to your follow up appointments (listed below)   DOCTOR'S APPOINTMENT   Future Appointments  Date Time Provider Department Center  07/28/2023  3:20 PM Paseda, Baird Kay, FNP [facility] None    Follow-up Information     [facility] Follow up on 07/28/2023.   Specialty: Internal Medicine Why: Your appointment is at 3:20 pm. Please arrive early and bring a picture ID, current medications and insurance card Contact information: [address] Anastasia Pall [place] [address] [phone] Additional information: [address] [place], [place] [address]                Take care and  be well!  Family Medicine Teaching Service Inpatient Team [place]  [facility]  [address] [place], [place] [address] [phone]  Local Endocrinologists [facility] [phone]) Dr. Carlus Pavlov Dr. Altamese [place] Abby [facility] [phone]) Dr. Talmage Coin Dr. Ocie Cornfield [facility] [phone]) Dr. Dorisann Frames Dr. Marlene Lard [facility] [phone]) Dr. Deirdre Pippins End[facility] [phone]) [[place] office]  [phone]) Dan Humphreys office] Dr. Wendall Mola Dr. Verdis Frederickson Dr. Quillian Quince [facility]) [phone]) [phone] [facility] [address]) [facility]) [phone]) Autumn Pearletha Forge Yetta Barre), PA Dr. Izell [place] Dr. Jillyn Ledger. Dr. Kyung Rudd (private practice) ([address]) [phone]) [facility]) [phone] [place]) [phone]) Dr. Oretha Ellis, NP Margarette Canada, PA Dr. Casimiro Needle [place] Dr. Jackquline [facility] [phone]) Dr. Lala Lund, FNP

## 2023-07-03 NOTE — Evaluation (Signed)
Physical Therapy Brief Evaluation and Discharge Note Patient Details Name: Meghan Pruitt MRN: [id number] DOB: [birth year] Today's Date: 07/03/2023   History of Present Illness  Meghan Pruitt is a 43 y.o. female with history of chronic bronchitis presenting with 2 days of cough, chest pain, and fever found to be Flu A positive. Patient arrived altered and hypoxic requiring oxygen. Met sepsis criteria and was started on CTX, azithromycin and IVF. Limited PMH on file. Pt with no hx of DM. A1C 6.1 in 2020 and elevated to 9.9.  Clinical Impression  PTA pt living with teenage children in single story home with ramped entrance. Pt completely independent with community ambulation, ADLs and iADLs. Pt anxious to discharge from hospital but is agreeable to short PT evaluation. Pt experienced LoB with OT yesterday requiring assist to steady. Pt ambulates and ascends/descends stairs independently today. Pt scores 22/24 on DGI, which is indicative of decreased fall risk in older adults. Pt maintains SpO2 on RA >90%O2 with ambulation and using stairs. Pt educated on short bouts of mobilization every hour during the day to maintain strength, balance and endurance whils she is recovering from the flu. Pt has no further PT needs at this time.         PT Assessment Patient does not need any further PT services  Assistance Needed at Discharge  None    Equipment Recommendations None recommended by PT     Precautions/Restrictions Precautions Precautions: Fall Restrictions Weight Bearing Restrictions Per Provider Order: No        Mobility  Transfers                   General transfer comment: up walking in room    Ambulation/Gait Ambulation/Gait assistance: Independent Gait Distance (Feet): 350 Feet Assistive device: None Gait Pattern/deviations: WFL(Within Functional Limits), Step-through pattern, Wide base of support Gait Speed: Pace WFL General Gait Details: increased lateral sway due  to body habitus with ambulation, no overt LoB  Home Activity Instructions Home Activity Instructions: getting up and moving every waking hour of day to maintain strength and balance  Stairs Stairs: Yes Stairs assistance: Independent Stair Management: No rails, Forwards Number of Stairs: 3 General stair comments: strong, steady ascent/descent with no UE support required       Balance   Sitting-balance support: No upper extremity supported, Feet supported Sitting balance-Leahy Scale: Good     Standing balance support: No upper extremity supported, During functional activity Standing balance-Leahy Scale: Good   Standardized Balance Assessment Standardized Balance Assessment : Dynamic Gait Index Dynamic Gait Index Level Surface: Normal Change in Gait Speed: Normal Gait with Horizontal Head Turns: Mild Impairment Gait with Vertical Head Turns: Normal Gait and Pivot Turn: Normal Step Over Obstacle: Normal Step Around Obstacles: Mild Impairment Steps: Normal Total Score: 22      Pertinent Vitals/Pain PT - Brief Vital Signs All Vital Signs Stable: Other (comment) (SpO2 90-96%O2 on RA, HR 115 with ambulation) Pain Assessment Pain Assessment: Faces Faces Pain Scale: No hurt Pain Intervention(s): Monitored during session     Home Living Family/patient expects to be discharged to:: Private residence Living Arrangements: Children Available Help at Discharge: Family;Available PRN/intermittently (teenage children) Home Environment: Ramped entrance   Home Equipment: Agricultural consultant (2 wheels);BSC/3in1   Additional Comments: has two children 16y.o and 18y.o    Prior Function Level of Independence: Independent      UE/LE Assessment   UE ROM/Strength/Tone/Coordination: [facility]    LE ROM/Strength/Tone/Coordination: [facility]  Communication   Communication Communication: No apparent difficulties     Cognition Overall Cognitive Status: Appears within functional limits  for tasks assessed/performed       General Comments General comments (skin integrity, edema, etc.): pt anxious to get home to her children        PT Visit Diagnosis Unsteadiness on feet (R26.81)    No Skilled PT Patient is independent with all acitivity/mobility;Patient will have necessary level of assist by caregiver at discharge;All education completed    AMPAC 6 Clicks Help needed turning from your back to your side while in a flat bed without using bedrails?: None Help needed moving from lying on your back to sitting on the side of a flat bed without using bedrails?: None Help needed moving to and from a bed to a chair (including a wheelchair)?: None Help needed standing up from a chair using your arms (e.g., wheelchair or bedside chair)?: None Help needed to walk in hospital room?: None Help needed climbing 3-5 steps with a railing? : None 6 Click Score: 24      End of Session   Activity Tolerance: Patient tolerated treatment well Patient left:  (standing in room going through her belongings) Nurse Communication: Mobility status PT Visit Diagnosis: Unsteadiness on feet (R26.81)     Time: 1610-9604 PT Time Calculation (min) (ACUTE ONLY): 9 min  Charges:   PT Evaluation $PT Eval Low Complexity: 1 Low      Alishea Beaudin B. Beverely Risen PT, DPT Acute Rehabilitation Services Please use secure chat or  Call Office [phone]   Elon Alas [facility]  07/03/2023, 9:21 AM

## 2023-07-03 NOTE — Assessment & Plan Note (Addendum)
TSH 0.212 with T4 0.78. Possibly in the setting of acute illness.  - Will advise recheck with PCP s/p discharge

## 2023-07-03 NOTE — Progress Notes (Signed)
Daily Progress Note Intern Pager: [phone]  Patient name: Meghan Pruitt Medical record number: [id number] Date of birth: 1981-02-17 Age: 43 y.o. Gender: female  Primary Care Provider: Pcp, No Consultants: None  Code Status: Full Code   Pt Overview and Major Events to Date:  1/20: Admitted  Assessment and Plan: Meghan Pruitt is a 43 y.o. female with history of chronic bronchitis presenting with 2 days of cough, chest pain, and fever found to be Flu A positive. Patient arrived altered and hypoxic requiring oxygen. Met sepsis criteria and was started on CTX, azithromycin and IVF.  Assessment & Plan Acute hypoxic respiratory failure (HCC) Patient arrived febrile (102.8), tachycardic (120s), hypoxic (70-80s), and tachypneic (28) found to be Influenza A +. CXR with interstitial prominence. Most concerning for AHRF 2/2 URI. Considering component of COPD as well given smoking history and exam. EKG, BNP, CTPE unremarkable. Received CTX and azithromycin x1 in ED. Carboxyhemoglobin normal for smoking hx. Completed course of Azithromycin and steroids to improve symptoms. Ambulates without desaturating. - Anoro Ellipta daily - Duonebs scheduled q4 PRN - Supplement O2 as needed  - Continuous cardiac monitoring  - AM BMP Diabetes mellitus BG 227 on arrival. No documented hx of diabetes. A1c 6.1 in 2020 and elevated to 9.9. BG this AM 210. - Insulin regimen: 15 units LAI - Moderate SSI AC Influenza Febrile on admission and hypoxic. Found to have Influenza A+. - Tamiflu 75 BID x 5 days (1/20-1/24) - Tylenol 650 q6 prn for fevers/pain - Droplet precautions - Robitussin PRN - Supportive care Subclinical hyperthyroidism  Euthyroid Sick Syndrome TSH 0.212 with T4 0.78. Possibly in the setting of acute illness.  - Will advise recheck with PCP s/p discharge Hematuria (Resolved: 07/03/2023) UA with hematuria on arrival due to current menstrual cycle. Complains of urine frequency and urgency, but  no nitrites or leukocytes, low concern for UTI since her symptoms could also be related to DM.   Chronic and Stable Problems: unknown   FEN/GI: Carb Modified PPx: Lovenox Dispo: Home  Subjective:  Patient is doing well this morning is ambulating in her room since she wants to get out and feels anxious. She is eager to go home because her baby has the Flu as well. She is tolerating her diet well and is able to breath on RA without discomfort. No other concerns this morning.    Objective: Temp:  [98 F (36.7 C)-98.5 F (36.9 C)] 98.3 F (36.8 C) (01/23 1119) Pulse Rate:  [81-98] 98 (01/23 1119) Resp:  [17-19] 19 (01/23 1119) BP: (108-153)/(86-113) 108/86 (01/23 1119) SpO2:  [94 %-100 %] 96 % (01/23 1119) Physical Exam: General: Awake and alert.  NAD. Cardiovascular: RRR.  No M/R/G.  Respiratory: CTAB.  Normal WOB on RA.  No wheezing, crackles, rhonchi or diminished breath sounds. Abdomen: Soft, non-tender, non-distended. Normoactive bowel sounds. Extremities: No BLE edema. Able to move all extremities.  Laboratory: Most recent CBC Lab Results  Component Value Date   WBC 6.6 07/02/2023   HGB 12.6 07/02/2023   HCT 38.9 07/02/2023   MCV 98.0 07/02/2023   PLT 170 07/02/2023   Most recent BMP    Latest Ref Rng & Units 07/03/2023    6:14 AM  BMP  Glucose 70 - 99 mg/dL 147   BUN 6 - 20 mg/dL <5   Creatinine 8.29 - 1.00 mg/dL 5.62   Sodium 130 - 865 mmol/L 137   Potassium 3.5 - 5.1 mmol/L 2.8   Chloride 98 - 111  mmol/L 98   CO2 22 - 32 mmol/L 31   Calcium 8.9 - 10.3 mg/dL 8.5    Imaging/Diagnostic Tests: No new imaging.  Fortunato Curling, DO 07/03/2023, 1:03 PM PGY-1, [facility]  FPTS Intern pager: [phone], text pages welcome Secure chat group [facility] Teaching Service

## 2023-07-03 NOTE — Progress Notes (Signed)
Occupational Therapy Treatment Patient Details Name: Meghan Pruitt MRN: [id number] DOB: 02-26-1981 Today's Date: 07/03/2023   History of present illness Meghan Pruitt is a 43 y.o. female with history of chronic bronchitis presenting with 2 days of cough, chest pain, and fever found to be Flu A positive. Patient arrived altered and hypoxic requiring oxygen. Met sepsis criteria and was started on CTX, azithromycin and IVF. Limited PMH on file. Pt with no hx of DM. A1C 6.1 in 2020 and elevated to 9.9.   OT comments  Patient received in supine and states she is eager to return home to children. Patient was able to perform LB dressing with setup seated. Patient stood for 10 minutes demonstrating balance and mobility tasks with supervision and no AD and no LOB. Patient on RA with SpO2 96%.  Discharge recommendations continue to be appropriate.       If plan is discharge home, recommend the following:  A little help with walking and/or transfers   Equipment Recommendations  None recommended by OT    Recommendations for Other Services      Precautions / Restrictions Precautions Precautions: Fall Restrictions Weight Bearing Restrictions Per Provider Order: No       Mobility Bed Mobility Overal bed mobility: Modified Independent                  Transfers Overall transfer level: Modified independent                 General transfer comment: able to perform transfers without assistance     Balance Overall balance assessment: Needs assistance Sitting-balance support: No upper extremity supported, Feet supported Sitting balance-Leahy Scale: Good     Standing balance support: No upper extremity supported, During functional activity Standing balance-Leahy Scale: Good                             ADL either performed or assessed with clinical judgement   ADL Overall ADL's : Needs assistance/impaired Eating/Feeding: Independent   Grooming: Modified  independent;Standing               Lower Body Dressing: Set up;Sitting/lateral leans Lower Body Dressing Details (indicate cue type and reason): to donn socks Toilet Transfer: Modified Independent             General ADL Comments: able to ambulate in room to bathroom and sink without assistance or AD    Extremity/Trunk Assessment         Cervical / Trunk Assessment Cervical / Trunk Assessment: Normal    Vision       Perception     Praxis      Cognition Arousal: Alert Behavior During Therapy: WFL for tasks assessed/performed Overall Cognitive Status: Within Functional Limits for tasks assessed                                 General Comments: eager to go back home        Exercises      Shoulder Instructions       General Comments eager to return home, worried about sick 43 year old. SpO2 96% on RA    Pertinent Vitals/ Pain       Pain Assessment Pain Assessment: Faces Faces Pain Scale: No hurt Pain Intervention(s): Monitored during session  Home Living Family/patient expects to be discharged to:: Private residence Living Arrangements:  Children Available Help at Discharge: Family;Available PRN/intermittently Type of Home: House Home Access: Ramped entrance     Home Layout: One level     Bathroom Shower/Tub: Chief Strategy Officer: Standard     Home Equipment: Agricultural consultant (2 wheels);BSC/3in1   Additional Comments: has two children 16y.o and 18y.o      Prior Functioning/Environment              Frequency  Min 1X/week        Progress Toward Goals  OT Goals(current goals can now be found in the care plan section)  Progress towards OT goals: Progressing toward goals  Acute Rehab OT Goals Patient Stated Goal: to go home OT Goal Formulation: With patient Time For Goal Achievement: 07/16/23 Potential to Achieve Goals: Good ADL Goals Pt Will Transfer to Toilet: Independently;ambulating Additional  ADL Goal #1: Pt will engage in ADL for 15-44min with stable vital signs. Additional ADL Goal #2: Pt will demonstrate good balance with dynamic stability task during IADL.  Plan      Co-evaluation                 AM-PAC OT "6 Clicks" Daily Activity     Outcome Measure   Help from another person eating meals?: None Help from another person taking care of personal grooming?: None Help from another person toileting, which includes using toliet, bedpan, or urinal?: None Help from another person bathing (including washing, rinsing, drying)?: None Help from another person to put on and taking off regular upper body clothing?: None Help from another person to put on and taking off regular lower body clothing?: A Little 6 Click Score: 23    End of Session    OT Visit Diagnosis: Other abnormalities of gait and mobility (R26.89)   Activity Tolerance Patient tolerated treatment well   Patient Left in bed;with call bell/phone within reach;with bed alarm set   Nurse Communication Mobility status        Time: 0865-7846 OT Time Calculation (min): 14 min  Charges: OT General Charges $OT Visit: 1 Visit OT Treatments $Self Care/Home Management : 8-22 mins  Alfonse Flavors, OTA Acute Rehabilitation Services  Office [phone]   Dewain Penning 07/03/2023, 11:26 AM

## 2023-07-03 NOTE — Assessment & Plan Note (Addendum)
BG 227 on arrival. No documented hx of diabetes. A1c 6.1 in 2020 and elevated to 9.9. BG this AM 210. - Insulin regimen: 15 units LAI - Moderate SSI AC

## 2023-07-03 NOTE — Final Progress Note (Signed)
Discharged instructions given, ambulatory, went down to Pharmacy while waiting for the medicine. Removed her own IV on her left arm.

## 2023-07-03 NOTE — Assessment & Plan Note (Addendum)
Patient arrived febrile (102.8), tachycardic (120s), hypoxic (70-80s), and tachypneic (28) found to be Influenza A +. CXR with interstitial prominence. Most concerning for AHRF 2/2 URI. Considering component of COPD as well given smoking history and exam. EKG, BNP, CTPE unremarkable. Received CTX and azithromycin x1 in ED. Carboxyhemoglobin normal for smoking hx. Completed course of Azithromycin and steroids to improve symptoms. Ambulates without desaturating. - Anoro Ellipta daily - Duonebs scheduled q4 PRN - Supplement O2 as needed  - Continuous cardiac monitoring  - AM BMP

## 2023-07-03 NOTE — Assessment & Plan Note (Addendum)
Febrile on admission and hypoxic. Found to have Influenza A+. - Tamiflu 75 BID x 5 days (1/20-1/24) - Tylenol 650 q6 prn for fevers/pain - Droplet precautions - Robitussin PRN - Supportive care

## 2023-07-05 LAB — CULTURE, BLOOD (ROUTINE X 2)
Culture: NO GROWTH
Culture: NO GROWTH
Special Requests: ADEQUATE

## 2023-07-14 ENCOUNTER — Ambulatory Visit (INDEPENDENT_AMBULATORY_CARE_PROVIDER_SITE_OTHER): Payer: No Typology Code available for payment source | Admitting: Family Medicine

## 2023-07-14 ENCOUNTER — Encounter: Payer: Self-pay | Admitting: Family Medicine

## 2023-07-14 VITALS — BP 93/79 | HR 91 | Ht 62.0 in | Wt 236.8 lb

## 2023-07-14 DIAGNOSIS — Z1159 Encounter for screening for other viral diseases: Secondary | ICD-10-CM

## 2023-07-14 DIAGNOSIS — E0590 Thyrotoxicosis, unspecified without thyrotoxic crisis or storm: Secondary | ICD-10-CM

## 2023-07-14 DIAGNOSIS — Z23 Encounter for immunization: Secondary | ICD-10-CM

## 2023-07-14 DIAGNOSIS — J9601 Acute respiratory failure with hypoxia: Secondary | ICD-10-CM | POA: Diagnosis not present

## 2023-07-14 DIAGNOSIS — E119 Type 2 diabetes mellitus without complications: Secondary | ICD-10-CM

## 2023-07-14 DIAGNOSIS — Z0000 Encounter for general adult medical examination without abnormal findings: Secondary | ICD-10-CM | POA: Diagnosis not present

## 2023-07-14 DIAGNOSIS — Z794 Long term (current) use of insulin: Secondary | ICD-10-CM

## 2023-07-14 MED ORDER — ACCU-CHEK SOFTCLIX LANCETS MISC: 3 refills | Status: AC

## 2023-07-14 MED ORDER — ACCU-CHEK SOFTCLIX LANCET DEV KIT: PACK | 0 refills | Status: AC

## 2023-07-14 MED ORDER — DEXCOM G7 SENSOR MISC: 5 refills | Status: DC

## 2023-07-14 NOTE — Patient Instructions (Signed)
It was great to see you again today!  Flu shot today Checking urine and bloodwork Go pick up dexcom G7 and your glucose lancets at the pharmacy Referring to eye doctor  Follow up with Dr. Raymondo Band on Thursday and me as scheduled later this month   Be well, Dr. Pollie Meyer

## 2023-07-14 NOTE — Progress Notes (Unsigned)
  Date of Visit: 07/14/2023   SUBJECTIVE:   HPI:  Meghan Pruitt presents today for a new patient appointment to establish general primary care.  Prior PCP: ***, last seen ***  Other care team members: ***  Concerns today: ***    PCP Follow-up Recommendations: Recheck UA as outpatient given hematuria Recheck TSH as outpatient (TSH low, T4 normal here) Recommend assessment for OSA Patient with NEW ONSET diabetes and BMI 38, would be a good candidate for Ozempic/GLP-1 Titrate insulin as needed based on BGs  Recommend PFTs outpatient. Repeat BMP at f/u (hypokalemia one day) Discuss long term contraception plan      Past Medical Hx:  -***  Past Surgical Hx:  -***  Family Hx: updated in Epic  Social Hx:  - occupation: *** - highest level of education: *** - lives with: *** - tobacco: *** - alcohol: *** - drugs: ***  Health Maintenance:  -***   OBJECTIVE:   BP 93/79   Pulse 91   Ht 5\' 2"  (1.575 m)   Wt 236 lb 12.8 oz (107.4 kg)   SpO2 97%   BMI 43.31 kg/m  Gen: *** HEENT: *** Heart: *** Lungs: *** Neuro: *** Ext: ***  ASSESSMENT/PLAN:   Health maintenance:  -***  Assessment & Plan Diabetes mellitus  Need for hepatitis C screening test  Subclinical hyperthyroidism  Euthyroid Sick Syndrome  Insulin dependent type 2 diabetes mellitus (HCC)     FOLLOW UP: Follow up in *** for ***  [place] J. Pollie Meyer, MD, [facility]

## 2023-07-15 ENCOUNTER — Other Ambulatory Visit: Payer: Self-pay | Admitting: Family Medicine

## 2023-07-15 DIAGNOSIS — E119 Type 2 diabetes mellitus without complications: Secondary | ICD-10-CM

## 2023-07-15 LAB — HCV INTERPRETATION

## 2023-07-15 LAB — BASIC METABOLIC PANEL
BUN/Creatinine Ratio: 7 — ABNORMAL LOW (ref 9–23)
BUN: 7 mg/dL (ref 6–24)
CO2: 25 mmol/L (ref 20–29)
Calcium: 10 mg/dL (ref 8.7–10.2)
Chloride: 104 mmol/L (ref 96–106)
Creatinine, Ser: 1 mg/dL (ref 0.57–1.00)
Glucose: 242 mg/dL — ABNORMAL HIGH (ref 70–99)
Potassium: 4.6 mmol/L (ref 3.5–5.2)
Sodium: 141 mmol/L (ref 134–144)
eGFR: 73 mL/min/{1.73_m2} (ref >59)

## 2023-07-15 LAB — MICROALBUMIN / CREATININE URINE RATIO
Creatinine, Urine: 403.3 mg/dL
Microalb/Creat Ratio: 3 mg/g{creat} (ref 0–29)
Microalbumin, Urine: 14.1 ug/mL

## 2023-07-15 LAB — TSH RFX ON ABNORMAL TO FREE T4: TSH: 0.896 u[IU]/mL (ref 0.450–4.500)

## 2023-07-15 LAB — HCV AB W REFLEX TO QUANT PCR: HCV Ab: NONREACTIVE

## 2023-07-15 NOTE — Telephone Encounter (Signed)
This patient has new onset diabetes and needs a CGM (on insulin). She has medicaid - I thought they cover dexcom 7 but pharmacy is saying it needs PA.  Can you guys look into this or advise?  Thanks! Latrelle Dodrill, MD

## 2023-07-16 ENCOUNTER — Telehealth: Payer: Self-pay

## 2023-07-16 DIAGNOSIS — Z0000 Encounter for general adult medical examination without abnormal findings: Secondary | ICD-10-CM | POA: Insufficient documentation

## 2023-07-16 NOTE — Assessment & Plan Note (Signed)
Hep C screen today Flu shot today Address more health maintenance at next visit

## 2023-07-16 NOTE — Telephone Encounter (Signed)
 PA pending, will submit when recent chart notes are closed.

## 2023-07-16 NOTE — Telephone Encounter (Signed)
New encounter created for PA

## 2023-07-17 ENCOUNTER — Ambulatory Visit: Payer: No Typology Code available for payment source | Admitting: Pharmacist

## 2023-07-17 ENCOUNTER — Encounter: Payer: Self-pay | Admitting: Pharmacist

## 2023-07-17 VITALS — BP 116/77 | HR 93 | Wt 239.6 lb

## 2023-07-17 DIAGNOSIS — F17200 Nicotine dependence, unspecified, uncomplicated: Secondary | ICD-10-CM

## 2023-07-17 DIAGNOSIS — E119 Type 2 diabetes mellitus without complications: Secondary | ICD-10-CM

## 2023-07-17 MED ORDER — VARENICLINE TARTRATE 0.5 MG PO TABS: 0.5000 mg | ORAL_TABLET | Freq: Two times a day (BID) | ORAL | 3 refills | Status: AC

## 2023-07-17 NOTE — Assessment & Plan Note (Signed)
 Tobacco use disorder longstanding with history of 17 pack years (brand: [place]). Currently has decreased number of cigarettes smoked to 8-10 cigarettes per day. Patient expressed interest to quit -Started varenicline  0.5 mg once daily with food for 7 days, then twice daily -Set goal with patient to decrease number of cigarettes from ~ 10 to to 6-9 cigarettes per day -Educated on potential side effects.

## 2023-07-17 NOTE — Assessment & Plan Note (Signed)
 Diabetes recently diagnosed in 06/30/2023. Patient is able to verbalize appropriate hypoglycemia management plan. Medication adherence appears good. Patient had used [place] 3 sensor, reports blood glucose around 200s mg/dL but now needs conversion to Dexcom per her insurance preference. -Increased dose of basal insulin  Semglee  (insulin  glargine) from 15 units daily in the morning. Patient will continue to titrate 1 unit (starting 07/19/23) every day if fasting blood sugar > 150mg /dl until fasting blood sugars reach goal or next visit.  -A new sample Dexcom G7 sensor is applied on patient's left arm -Patient educated on purpose, proper use, and potential adverse effects.  -Extensively discussed pathophysiology of diabetes, recommended lifestyle interventions, dietary effects on blood sugar control.  -Counseled on s/sx of and management of hypoglycemia.

## 2023-07-17 NOTE — Progress Notes (Signed)
    S:     Chief Complaint  Patient presents with   Medication Management    New Dexcom CGM   43 y.o. female who presents for diabetes evaluation, education, and management. Patient arrives in good spirits and presents without any assistance. Patient is accompanied with her daughter.   Patient was referred and last seen by Primary Care Provider, Dr. Donah, on 07/14/2023.   PMH is significant for diabetes.  At last visit, CGM was sent to the pharmacy, and continued present dose of insulin , hesitant to increase without being able to check sugars.  Patient reports Diabetes was diagnosed in 06/30/2023.   Current diabetes medications include: insulin  glargine-yfgn (Semglee ) 15 units daily  Patient reports adherence to taking all medications as prescribed.   Insurance coverage: [employer]  Patient denies visual changes. Patient reported dietary habits Breakfast: baked potatoes, bread and pasta, trying to limit portion sizes Drinks: changed from regular soda to ZERO sugar soda  O:   Review of Systems  Neurological:  Positive for headaches.  All other systems reviewed and are negative.   Physical Exam Vitals reviewed.  Constitutional:      Appearance: Normal appearance.  Pulmonary:     Effort: Pulmonary effort is normal.  Neurological:     Mental Status: She is alert. Mental status is at baseline.  Psychiatric:        Mood and Affect: Mood normal.        Behavior: Behavior normal.        Thought Content: Thought content normal.        Judgment: Judgment normal.     Lab Results  Component Value Date   HGBA1C 9.9 (H) 06/30/2023   Vitals:   07/17/23 1507  BP: 116/77  Pulse: 93  SpO2: 99%    Patient is participating in a Managed Medicaid Plan:  Yes   A/P: Diabetes recently diagnosed in 06/30/2023. Patient is able to verbalize appropriate hypoglycemia management plan. Medication adherence appears good. Patient had used [place] 3 sensor, reports blood glucose around  200s mg/dL but now needs conversion to Dexcom per her insurance preference. -Increased dose of basal insulin  Semglee  (insulin  glargine) from 15 units daily in the morning. Patient will continue to titrate 1 unit (starting 07/19/23) every day if fasting blood sugar > 150mg /dl until fasting blood sugars reach goal or next visit.  -A new sample Dexcom G7 sensor is applied on patient's left arm -Patient educated on purpose, proper use, and potential adverse effects.  -Extensively discussed pathophysiology of diabetes, recommended lifestyle interventions, dietary effects on blood sugar control.  -Counseled on s/sx of and management of hypoglycemia.   Tobacco use disorder longstanding with history of 17 pack years (brand: Newport). Currently has decreased number of cigarettes smoked to 8-10 cigarettes per day. Patient expressed interest to quit -Started varenicline  0.5 mg once daily with food for 7 days, then twice daily -Set goal with patient to decrease number of cigarettes from ~ 10 to to 6-9 cigarettes per day -Educated on potential side effects.  Written patient instructions provided. Patient verbalized understanding of treatment plan.  Total time in face to face counseling 34 minutes.    Follow-up:  Pharmacist 07/31/2023 PCP clinic visit in PRN Patient seen with Owens Cowing, PharmD Candidate and Doyal Goods, PharmD Candidate.

## 2023-07-17 NOTE — Telephone Encounter (Signed)
 Pharmacy Patient Advocate Encounter   Received notification from CoverMyMeds that prior authorization for dexcom g7 sensor is required/requested.   Insurance verification completed.   The patient is insured through [facility] .   PA required; PA submitted to above mentioned insurance via CoverMyMeds Key/confirmation #/EOC [id number]. Status is pending

## 2023-07-17 NOTE — Patient Instructions (Addendum)
 It was nice to see you today!  Your goal blood sugar is 80-130 before eating and less than 180 after eating.  Medication Changes:  Start varenicline  0.5 mg once daily with food for 7 days, then twice daily  Increase insulin  glargine-ygfn (Semglee ) 15 units daily in the morning 1 unit everyday starting Saturday (02/08) if fasting blood sugar > 150 mg/dL  Continue all other medication the same.  Keep up the good work with diet and exercise. Aim for a diet full of vegetables, fruit and lean meats (chicken, turkey, fish). Try to limit salt intake by eating fresh or frozen vegetables (instead of canned), rinse canned vegetables prior to cooking and do not add any additional salt to meals.

## 2023-07-18 ENCOUNTER — Other Ambulatory Visit (HOSPITAL_COMMUNITY): Payer: Self-pay

## 2023-07-18 NOTE — Telephone Encounter (Signed)
 Pharmacy Patient Advocate Encounter   Insurance verification completed.   The patient is insured through [facility] .   Per test claim: PA required; PA submitted to above mentioned insurance via CoverMyMeds Key/confirmation #/EOC [employer]. Status is pending

## 2023-07-18 NOTE — Telephone Encounter (Signed)
 Pharmacy Patient Advocate Encounter  Received notification from [facility] that Prior Authorization for [facility] G7 SENSOR has been  deemed N/A.  Member has alternative pharmacy benefits- patient has nurse, learning disability.  Will submit to commerical coverage.   PA #/Case ID/Reference #: [id number]

## 2023-07-21 ENCOUNTER — Other Ambulatory Visit (HOSPITAL_COMMUNITY): Payer: Self-pay

## 2023-07-21 NOTE — Telephone Encounter (Signed)
 Pharmacy Patient Advocate Encounter  Received notification from [employer] that Prior Authorization for [facility] G7 SENSOR has been DENIED.  Full denial letter will be uploaded to the media tab. See denial reason below.    (Injecting insulin  once daily)  PA #/Case ID/Reference #: [id number]

## 2023-07-21 NOTE — Progress Notes (Signed)
 Reviewed and agree with Dr Macky Lower plan.

## 2023-07-22 MED ORDER — FREESTYLE LIBRE 3 PLUS SENSOR MISC: 3 refills | Status: DC

## 2023-07-22 NOTE — Addendum Note (Signed)
Addended by: Latrelle Dodrill on: 07/22/2023 09:03 AM   Modules accepted: Orders

## 2023-07-24 ENCOUNTER — Ambulatory Visit: Payer: No Typology Code available for payment source | Admitting: Pharmacist

## 2023-07-28 ENCOUNTER — Ambulatory Visit (INDEPENDENT_AMBULATORY_CARE_PROVIDER_SITE_OTHER): Payer: No Typology Code available for payment source | Admitting: Family Medicine

## 2023-07-28 ENCOUNTER — Other Ambulatory Visit (HOSPITAL_COMMUNITY): Payer: Self-pay

## 2023-07-28 ENCOUNTER — Ambulatory Visit: Payer: Self-pay | Admitting: Nurse Practitioner

## 2023-07-28 ENCOUNTER — Encounter: Payer: Self-pay | Admitting: Family Medicine

## 2023-07-28 VITALS — BP 141/89 | HR 96 | Ht 62.0 in | Wt 243.0 lb

## 2023-07-28 DIAGNOSIS — Z23 Encounter for immunization: Secondary | ICD-10-CM | POA: Diagnosis not present

## 2023-07-28 DIAGNOSIS — E119 Type 2 diabetes mellitus without complications: Secondary | ICD-10-CM

## 2023-07-28 DIAGNOSIS — F17200 Nicotine dependence, unspecified, uncomplicated: Secondary | ICD-10-CM | POA: Diagnosis not present

## 2023-07-28 DIAGNOSIS — Z0000 Encounter for general adult medical examination without abnormal findings: Secondary | ICD-10-CM

## 2023-07-28 DIAGNOSIS — Z3042 Encounter for surveillance of injectable contraceptive: Secondary | ICD-10-CM

## 2023-07-28 DIAGNOSIS — R3129 Other microscopic hematuria: Secondary | ICD-10-CM | POA: Diagnosis not present

## 2023-07-28 DIAGNOSIS — R0683 Snoring: Secondary | ICD-10-CM

## 2023-07-28 DIAGNOSIS — A599 Trichomoniasis, unspecified: Secondary | ICD-10-CM

## 2023-07-28 DIAGNOSIS — K219 Gastro-esophageal reflux disease without esophagitis: Secondary | ICD-10-CM

## 2023-07-28 LAB — POCT UA - MICROSCOPIC ONLY
Trichomonas, UA: POSITIVE
WBC, Ur, HPF, POC: NONE SEEN (ref 0–5)

## 2023-07-28 LAB — POCT URINALYSIS DIP (MANUAL ENTRY)
Bilirubin, UA: NEGATIVE
Blood, UA: NEGATIVE
Glucose, UA: >=1000 mg/dL — AB
Ketones, POC UA: NEGATIVE mg/dL
Leukocytes, UA: NEGATIVE
Nitrite, UA: NEGATIVE
Protein Ur, POC: NEGATIVE mg/dL
Spec Grav, UA: 1.025 (ref 1.010–1.025)
Urobilinogen, UA: 2 U/dL — AB
pH, UA: 5.5 (ref 5.0–8.0)

## 2023-07-28 MED ORDER — INSULIN GLARGINE-YFGN 100 UNIT/ML ~~LOC~~ SOPN: 20.0000 [IU] | PEN_INJECTOR | Freq: Every day | SUBCUTANEOUS | 3 refills | Status: DC

## 2023-07-28 MED ORDER — METRONIDAZOLE 500 MG PO TABS: 500.0000 mg | ORAL_TABLET | Freq: Two times a day (BID) | ORAL | 0 refills | Status: AC

## 2023-07-28 MED ORDER — PANTOPRAZOLE SODIUM 20 MG PO TBEC: 20.0000 mg | DELAYED_RELEASE_TABLET | Freq: Every day | ORAL | 1 refills | Status: AC

## 2023-07-28 NOTE — Assessment & Plan Note (Signed)
Encouraged to start chantix Fortunately is motivated to back off smoking

## 2023-07-28 NOTE — Assessment & Plan Note (Signed)
PCV 20 today Scheduled appointment for pap smear Referring to ophtho for diabetic eye exam

## 2023-07-28 NOTE — Assessment & Plan Note (Signed)
Doing well with insulin titration, continue New rx for glargine sent in Dr. Raymondo Band assisted with applying new CGM, working on PA for this

## 2023-07-28 NOTE — Patient Instructions (Addendum)
It was great to see you again today.  Ordered sleep study Pneumonia shot today Referring to eye doctor Sent in medication for GERD (protonix 20mg  daily) Rechecking urine Next depo due mid April Scheduled pap smear for next visit Refilled insulin  Be well, Dr. Pollie Meyer

## 2023-07-28 NOTE — Assessment & Plan Note (Signed)
Falling asleep throughout her daughter's visit today With morbid obesity, excessive daytime sleepiness, and snoring high suspicion for OSA Split night study ordered

## 2023-07-28 NOTE — Progress Notes (Unsigned)
  Date of Visit: 07/28/2023   SUBJECTIVE:   HPI:  Meghan Pruitt presents today for follow-up.  I recently saw her for hospital follow-up, this is a continuation of that visit as well as a new patient visit.  We reviewed her past medical history in detail together.  Prior PCP: Dr. Concepcion Elk - has not seen in many years  PMHx:  - chronic bronchitis  - type 2 diabetes - recently diagnosed during hospital stay, currently taking insulin 20u daily, titrating up on her own, has dexcom on but neesd replaced, denies low sugars  - heartburn - recently more of an issue, worse at night, requests rx for this  Surgeries: none  Prior hospitalizations: none other than recent hospitalization for flu with new onset diabetes  Family history: no FHx of malignancy  Allergies: none  Social: - lives with 3 kids ages 35, 63, 67 - has smoked since age 50-24, 1/2 ppd, working on quitting, just saw Dr. Raymondo Band and had Chantix prescribed which she has not yet picked up but plans to - occasional alcohol - denies drug use though 59 yo daughter accompanies to today's visit (UDS in hospital positive for cocaine and THC)    OBJECTIVE:   BP (!) 141/89   Pulse 96   Ht 5\' 2"  (1.575 m)   Wt 243 lb (110.2 kg)   SpO2 98%   BMI 44.45 kg/m  Gen: no acute distress, pleasant, cooperative, falling asleep frequently throughout today's visit as well as her 41 yo daughter's visit with me HEENT: Normocephalic, atraumatic Heart: Regular rate and rhythm, no murmur Lungs: Clear to auscultation bilaterally, normal effort Neuro: Grossly nonfocal, speech normal, sleepy as above  ASSESSMENT/PLAN:   Assessment & Plan Diabetes mellitus Doing well with insulin titration, continue New rx for glargine sent in Dr. Raymondo Band assisted with applying new CGM, working on PA for this Routine adult health maintenance PCV 20 today Scheduled appointment for pap smear Referring to ophtho for diabetic eye exam Tobacco use  disorder Encouraged to start chantix Fortunately is motivated to back off smoking Obesity, Class III, BMI 40-49.9 (morbid obesity) (HCC) Falling asleep throughout he's and her daughter's visit today With morbid obesity, excessive daytime sleepiness, and snoring high suspicion for OSA Split night study ordered Trichomonas infection UA obtained to recheck microscopic hematuria (not done last visit) No hematuria, but does show trichomonas Rx sent for flagyl 500mg  twice daily x7 days Recommend full STI testing at next visit when we do Pap smear Gastroesophageal reflux disease, unspecified whether esophagitis present Rx for pantoprazole, see if this improves symptoms Encounter for surveillance of injectable contraceptive Doing well with Depo, next shot due mid April, advised of this timeline    FOLLOW UP: Follow up in 10 days for pap smear  [place] J. Pollie Meyer, MD [facility]

## 2023-07-30 ENCOUNTER — Other Ambulatory Visit (HOSPITAL_COMMUNITY): Payer: Self-pay

## 2023-07-30 DIAGNOSIS — K219 Gastro-esophageal reflux disease without esophagitis: Secondary | ICD-10-CM | POA: Insufficient documentation

## 2023-07-30 DIAGNOSIS — Z309 Encounter for contraceptive management, unspecified: Secondary | ICD-10-CM | POA: Insufficient documentation

## 2023-07-30 NOTE — Assessment & Plan Note (Signed)
Rx for pantoprazole, see if this improves symptoms

## 2023-07-30 NOTE — Assessment & Plan Note (Signed)
Doing well with Depo, next shot due mid April, advised of this timeline

## 2023-07-31 ENCOUNTER — Telehealth: Payer: Self-pay | Admitting: Pharmacist

## 2023-07-31 ENCOUNTER — Other Ambulatory Visit (HOSPITAL_COMMUNITY): Payer: Self-pay

## 2023-07-31 ENCOUNTER — Other Ambulatory Visit: Payer: Self-pay | Admitting: Family Medicine

## 2023-07-31 ENCOUNTER — Ambulatory Visit: Payer: No Typology Code available for payment source | Admitting: Pharmacist

## 2023-07-31 NOTE — Telephone Encounter (Signed)
Initiating PA under secondary insurance, managed medicaid.

## 2023-07-31 NOTE — Telephone Encounter (Signed)
Attempted to contact patient for reschedule or virtual visit option due to clinic closure today due to weather.  No answer.   Left HIPAA compliant voice mail requesting call back to direct phone: [phone]  Additionally, I communicated that resolution of the PA for her CGM sensors was still in process.  I contacted her pharmacy and attempted to collaborate with Siri Cole, CPhT to resolve.   Total time with patient call and documentation of interaction: 18 minutes.

## 2023-07-31 NOTE — Telephone Encounter (Signed)
Pharmacy Patient Advocate Encounter  Received notification from  [facility]  that Prior Authorization for FREESTYLE LIBRE 3 PLUS SENSORS has been APPROVED from 07/30/23 to 07/29/24   PA #/Case ID/Reference #: [id number]

## 2023-07-31 NOTE — Telephone Encounter (Signed)
Pharmacy Patient Advocate Encounter   The patient is insured through [facility] .  PA required; PA submitted to above mentioned insurance via CoverMyMeds Key/confirmation #/EOC [id number]. Status is pending

## 2023-08-01 ENCOUNTER — Other Ambulatory Visit (HOSPITAL_COMMUNITY): Payer: Self-pay

## 2023-08-01 NOTE — Telephone Encounter (Signed)
Reached out to secondary insurance [employer] (managed medicaid). A PA is not needed. Was told to reach out to pharmacy for them to do an override. They can call [employer] if needed at [phone].  Called [facility] to explain billing. They will reach out to insurance company.

## 2023-08-04 ENCOUNTER — Telehealth: Payer: Self-pay | Admitting: Pharmacist

## 2023-08-04 NOTE — Telephone Encounter (Signed)
 Attempted to contact patient for follow-up of PFTs   Left HIPAA compliant voice mail requesting call back to direct phone: [phone]  Total time with patient call and documentation of interaction: 2 minutes.

## 2023-08-05 ENCOUNTER — Ambulatory Visit: Payer: No Typology Code available for payment source | Admitting: Pharmacist

## 2023-08-05 NOTE — Telephone Encounter (Signed)
 Reviewed and agree with Dr Macky Lower plan.

## 2023-08-07 ENCOUNTER — Ambulatory Visit: Payer: No Typology Code available for payment source | Admitting: Family Medicine

## 2023-08-12 ENCOUNTER — Telehealth: Payer: Self-pay | Admitting: Pharmacist

## 2023-08-12 NOTE — Telephone Encounter (Signed)
 Reviewed and agree with Dr Macky Lower plan.

## 2023-08-12 NOTE — Telephone Encounter (Signed)
 Attempted to contact patient for follow-up of CGM transition to [place] 3   Attempted contact to discuss glucose control and use of Libre 3. (Patient is not sharing data with our practice currently).   No - answer - VM full.   Total time with patient call and documentation of interaction: 6 minutes.  Follow-up phone call planned: 1-2 days

## 2023-10-02 ENCOUNTER — Ambulatory Visit (INDEPENDENT_AMBULATORY_CARE_PROVIDER_SITE_OTHER)

## 2023-10-02 DIAGNOSIS — Z3042 Encounter for surveillance of injectable contraceptive: Secondary | ICD-10-CM | POA: Diagnosis not present

## 2023-10-02 MED ORDER — MEDROXYPROGESTERONE ACETATE 150 MG/ML IM SUSP
150.0000 mg | Freq: Once | INTRAMUSCULAR | Status: AC
Start: 2023-10-02 — End: 2023-10-02
  Administered 2023-10-02: 150 mg via INTRAMUSCULAR

## 2023-10-02 NOTE — Progress Notes (Signed)
 Patient here today for Depo Provera  injection and is within her dates.    Last contraceptive apt was 07/03/2023, given during inpatient stay.  Depo given in RD today. Site unremarkable & patient tolerated injection.    Next injection due 12/18/2023-01/01/2024.  Reminder card given.

## 2023-10-27 ENCOUNTER — Ambulatory Visit: Admitting: Family Medicine

## 2023-11-02 ENCOUNTER — Ambulatory Visit (HOSPITAL_BASED_OUTPATIENT_CLINIC_OR_DEPARTMENT_OTHER): Admitting: Internal Medicine

## 2023-11-06 ENCOUNTER — Ambulatory Visit: Admitting: Family Medicine

## 2023-11-13 ENCOUNTER — Ambulatory Visit: Admitting: Family Medicine

## 2023-11-17 ENCOUNTER — Emergency Department (HOSPITAL_COMMUNITY)
Admission: EM | Admit: 2023-11-17 | Discharge: 2023-11-18 | Discharge status: Against Medical Advice | Source: Home / Self Care

## 2023-11-17 ENCOUNTER — Encounter (HOSPITAL_COMMUNITY): Payer: Self-pay

## 2023-11-17 ENCOUNTER — Emergency Department (HOSPITAL_COMMUNITY)

## 2023-11-17 ENCOUNTER — Emergency Department (HOSPITAL_COMMUNITY)
Admission: EM | Admit: 2023-11-17 | Discharge: 2023-11-17 | Discharge status: Against Medical Advice | Attending: *Deleted | Admitting: *Deleted

## 2023-11-17 ENCOUNTER — Other Ambulatory Visit: Payer: Self-pay

## 2023-11-17 DIAGNOSIS — M7918 Myalgia, other site: Secondary | ICD-10-CM | POA: Diagnosis present

## 2023-11-17 DIAGNOSIS — Z794 Long term (current) use of insulin: Secondary | ICD-10-CM | POA: Diagnosis not present

## 2023-11-17 DIAGNOSIS — Z5321 Procedure and treatment not carried out due to patient leaving prior to being seen by health care provider: Secondary | ICD-10-CM | POA: Diagnosis not present

## 2023-11-17 DIAGNOSIS — E119 Type 2 diabetes mellitus without complications: Secondary | ICD-10-CM | POA: Diagnosis not present

## 2023-11-17 DIAGNOSIS — Z5329 Procedure and treatment not carried out because of patient's decision for other reasons: Secondary | ICD-10-CM | POA: Diagnosis not present

## 2023-11-17 DIAGNOSIS — M549 Dorsalgia, unspecified: Secondary | ICD-10-CM | POA: Diagnosis not present

## 2023-11-17 DIAGNOSIS — R0789 Other chest pain: Secondary | ICD-10-CM | POA: Insufficient documentation

## 2023-11-17 DIAGNOSIS — R079 Chest pain, unspecified: Secondary | ICD-10-CM | POA: Insufficient documentation

## 2023-11-17 DIAGNOSIS — M791 Myalgia, unspecified site: Secondary | ICD-10-CM

## 2023-11-17 DIAGNOSIS — R0602 Shortness of breath: Secondary | ICD-10-CM | POA: Insufficient documentation

## 2023-11-17 LAB — BASIC METABOLIC PANEL WITH GFR
Anion gap: 8 (ref 5–15)
Anion gap: 9 (ref 5–15)
BUN: 5 mg/dL — ABNORMAL LOW (ref 6–20)
BUN: 8 mg/dL (ref 6–20)
CO2: 25 mmol/L (ref 22–32)
CO2: 24 mmol/L (ref 22–32)
Calcium: 8.9 mg/dL (ref 8.9–10.3)
Calcium: 9.1 mg/dL (ref 8.9–10.3)
Chloride: 106 mmol/L (ref 98–111)
Chloride: 105 mmol/L (ref 98–111)
Creatinine, Ser: 0.71 mg/dL (ref 0.44–1.00)
Creatinine, Ser: 0.88 mg/dL (ref 0.44–1.00)
GFR, Estimated: >60 mL/min (ref >60)
GFR, Estimated: >60 mL/min (ref >60)
Glucose, Bld: 151 mg/dL — ABNORMAL HIGH (ref 70–99)
Glucose, Bld: 237 mg/dL — ABNORMAL HIGH (ref 70–99)
Potassium: 3.5 mmol/L (ref 3.5–5.1)
Potassium: 3.6 mmol/L (ref 3.5–5.1)
Sodium: 139 mmol/L (ref 135–145)
Sodium: 138 mmol/L (ref 135–145)

## 2023-11-17 LAB — CBC
HCT: 42.8 % (ref 36.0–46.0)
HCT: 43.8 % (ref 36.0–46.0)
Hemoglobin: 14.2 g/dL (ref 12.0–15.0)
Hemoglobin: 14.3 g/dL (ref 12.0–15.0)
MCH: 32.2 pg (ref 26.0–34.0)
MCH: 32.6 pg (ref 26.0–34.0)
MCHC: 33.2 g/dL (ref 30.0–36.0)
MCHC: 32.6 g/dL (ref 30.0–36.0)
MCV: 97.1 fL (ref 80.0–100.0)
MCV: 100 fL (ref 80.0–100.0)
Platelets: 228 10*3/uL (ref 150–400)
Platelets: 245 10*3/uL (ref 150–400)
RBC: 4.41 MIL/uL (ref 3.87–5.11)
RBC: 4.38 MIL/uL (ref 3.87–5.11)
RDW: 13 % (ref 11.5–15.5)
RDW: 13 % (ref 11.5–15.5)
WBC: 12.5 10*3/uL — ABNORMAL HIGH (ref 4.0–10.5)
WBC: 11.8 10*3/uL — ABNORMAL HIGH (ref 4.0–10.5)
nRBC: 0 % (ref 0.0–0.2)
nRBC: 0 % (ref 0.0–0.2)

## 2023-11-17 LAB — HCG, SERUM, QUALITATIVE
Preg, Serum: NEGATIVE
Preg, Serum: NEGATIVE

## 2023-11-17 LAB — RESP PANEL BY RT-PCR (RSV, FLU A&B, COVID)  RVPGX2
Influenza A by PCR: NEGATIVE
Influenza B by PCR: NEGATIVE
Resp Syncytial Virus by PCR: NEGATIVE
SARS Coronavirus 2 by RT PCR: NEGATIVE

## 2023-11-17 LAB — TROPONIN I (HIGH SENSITIVITY)
Troponin I (High Sensitivity): 4 ng/L (ref <18)
Troponin I (High Sensitivity): <2 ng/L (ref <18)

## 2023-11-17 NOTE — ED Provider Triage Note (Signed)
 Emergency Medicine Provider Triage Evaluation Note  Meghan Pruitt , a 43 y.o. female  was evaluated in triage.  Pt complains of chest pain and back pain.  Onset about 3 days ago.  Worsened today.  Reports associated cough and SOB.  Denies fever, chills, nausea, vomiting, or diarrhea.  Nothing makes symptoms better or worse.  Review of Systems  Positive: Chest pain, back pain Negative: fever  Physical Exam  BP 117/79   Pulse 87   Temp 98.7 F (37.1 C)   Resp 16   Ht 5\' 2"  (1.575 m)   Wt 110.2 kg   SpO2 98%   BMI 44.44 kg/m  Gen:   Awake, no distress   Resp:  Normal effort  MSK:   Moves extremities without difficulty  Other:    Medical Decision Making  Medically screening exam initiated at 6:05 AM.  Appropriate orders placed.  Dwana Gist was informed that the remainder of the evaluation will be completed by another provider, this initial triage assessment does not replace that evaluation, and the importance of remaining in the ED until their evaluation is complete.     Sherel Dikes, PA-C 11/17/23 [phone]

## 2023-11-17 NOTE — ED Triage Notes (Addendum)
 Pt BIB [facility] from home with mother in H22 d/t pain all over - mainly neck chest and upper back.    BP 118/84 HR 76 CBG 188 100 RA 19 RR  NS on  EKG

## 2023-11-17 NOTE — ED Triage Notes (Signed)
 Pt states that she has been having back pain, CP and SOB for the past 3 days.

## 2023-11-17 NOTE — ED Notes (Signed)
 Patient left without notifying anyone, unsure if her IV was removed.

## 2023-11-17 NOTE — ED Notes (Signed)
 Patient resting in bed with eyes closed, no s/s of distress, will continue to monitor.

## 2023-11-17 NOTE — ED Provider Notes (Signed)
 [facility] [facility]ENC[facility] AT [facility] Provider Note   CSN: [id number] Arrival date & time: 11/17/23  0533     History  Chief Complaint  Patient presents with   Chest Pain   Back Pain    Meghan Pruitt is a 43 y.o. female.  Patient complains of chest pain and bodyaches.  Patient reports symptoms began yesterday.  Patient denies any fever or chills.  Patient denies nausea or vomiting.  She denies any abdominal pain.  Patient reports she has a past medical history of diabetes she is not taking her morning medicines.  Patient denies being around anyone who has been sick.  Patient denies any past medical history of heart disease.  The history is provided by the patient. No language interpreter was used.  Chest Pain Pain location:  L chest Pain quality: aching   Pain severity:  Mild Onset quality:  Gradual Context: not breathing   Relieved by:  Nothing Worsened by:  Nothing Ineffective treatments:  None tried Associated symptoms: back pain   Associated symptoms: no abdominal pain, no fever, no shortness of breath and no syncope   Back Pain Associated symptoms: chest pain   Associated symptoms: no abdominal pain and no fever        Home Medications Prior to Admission medications   Medication Sig Start Date End Date Taking? Authorizing Provider  Accu-Chek Softclix Lancets lancets Use to check blood sugar 3 times daily Patient not taking: Reported on 07/17/2023 07/14/23   Candee Cha, MD  acetaminophen  (TYLENOL ) 325 MG tablet Take 2 tablets (650 mg total) by mouth every 6 (six) hours as needed for mild pain (pain score 1-3), fever or moderate pain (pain score 4-6). 07/03/23   Jagadish, Mayuri, MD  Blood Glucose Monitoring Suppl (BLOOD GLUCOSE MONITOR SYSTEM) w/Device KIT Use in the morning, at noon, and at bedtime. Patient not taking: Reported on 07/17/2023 07/03/23   Jagadish, Mayuri, MD  Continuous Glucose Sensor (FREESTYLE LIBRE 3 PLUS SENSOR) MISC CHANGE  SENSOR EVERY 15 DAYS. 08/05/23   Candee Cha, MD  insulin  glargine-yfgn (SEMGLEE ) 100 UNIT/ML Pen Inject 20 Units into the skin daily. 07/28/23   Candee Cha, MD  Insulin  Pen Needle 32G X 4 MM MISC Use with Semglee  07/03/23   Genora Kidd, MD  Lancets Misc. (ACCU-CHEK SOFTCLIX LANCET DEV) KIT Use to check blood sugar 3 times per day Patient not taking: Reported on 07/17/2023 07/14/23   Candee Cha, MD  metroNIDAZOLE  (FLAGYL ) 500 MG tablet Take 1 tablet (500 mg total) by mouth 2 (two) times daily. For 7 days. Do not drink alcohol while taking this medication. 07/28/23   Candee Cha, MD  naproxen  sodium (ALEVE ) 220 MG tablet Take 880 mg by mouth daily as needed (pain, headache).    [provider]  pantoprazole  (PROTONIX ) 20 MG tablet Take 1 tablet (20 mg total) by mouth daily. 07/28/23   Candee Cha, MD  varenicline  (CHANTIX ) 0.5 MG tablet Take 1 tablet (0.5 mg total) by mouth 2 (two) times daily. 1 tablet daily with food for 7 days, then twice daily 07/17/23   McDiarmid, Demetra Filter, MD      Allergies    Patient has no known allergies.    Review of Systems   Review of Systems  Constitutional:  Negative for fever.  Respiratory:  Negative for shortness of breath.   Cardiovascular:  Positive for chest pain. Negative for syncope.  Gastrointestinal:  Negative for abdominal pain.  Musculoskeletal:  Positive for back pain.  All other systems reviewed and are negative.   Physical Exam Updated Vital Signs BP 117/79   Pulse 87   Temp 98.7 F (37.1 C)   Resp 16   Ht 5\' 2"  (1.575 m)   Wt 110.2 kg   SpO2 98%   BMI 44.44 kg/m  Physical Exam Vitals and nursing note reviewed.  Constitutional:      Appearance: She is well-developed.  HENT:     Head: Normocephalic.  Cardiovascular:     Rate and Rhythm: Normal rate and regular rhythm.     Heart sounds: Normal heart sounds.  Pulmonary:     Effort: Pulmonary effort is normal.  Abdominal:      General: Bowel sounds are normal. There is no distension.     Palpations: Abdomen is soft.     Tenderness: There is no abdominal tenderness.  Musculoskeletal:        General: Normal range of motion.     Cervical back: Normal range of motion.  Skin:    General: Skin is warm.  Neurological:     General: No focal deficit present.     Mental Status: She is alert and oriented to person, place, and time.     ED Results / Procedures / Treatments   Labs (all labs ordered are listed, but only abnormal results are displayed) Labs Reviewed  BASIC METABOLIC PANEL WITH GFR - Abnormal; Notable for the following components:      Result Value   Glucose, Bld 151 (*)    BUN 5 (*)    All other components within normal limits  CBC - Abnormal; Notable for the following components:   WBC 12.5 (*)    All other components within normal limits  RESP PANEL BY RT-PCR (RSV, FLU A&B, COVID)  RVPGX2  HCG, SERUM, QUALITATIVE  TROPONIN I (HIGH SENSITIVITY)  TROPONIN I (HIGH SENSITIVITY)    EKG EKG Interpretation Date/Time:  Monday November 17 2023 06:36:45 EDT Ventricular Rate:  86 PR Interval:  168 QRS Duration:  74 QT Interval:  366 QTC Calculation: 437 R Axis:   82  Text Interpretation: Normal sinus rhythm Low voltage QRS Borderline ECG When compared with ECG of 01-Jul-2023 10:15, PREVIOUS ECG IS PRESENT No significant change was found Confirmed by Earma Gloss [phone]) on 11/17/2023 6:44:32 AM  Radiology DG Chest 2 View Result Date: 11/17/2023 CLINICAL DATA:  Chest pain. EXAM: CHEST - 2 VIEW COMPARISON:  07/08/2023 FINDINGS: Two views study. AP film limited by technique. Hazy opacity overlying the lateral lung bases likely superimposition of soft tissues. Within this limitation, no focal consolidation or evidence of pulmonary edema. No pleural effusion. The cardiopericardial silhouette is within normal limits for size. No acute bony abnormality. IMPRESSION: No acute cardiopulmonary findings.  Electronically Signed   By: Donnal Fusi M.D.   On: 11/17/2023 06:33    Procedures Procedures    Medications Ordered in ED Medications - No data to display  ED Course/ Medical Decision Making/ A&P                                 Medical Decision Making Patient complains of chest pain and bodyaches  Amount and/or Complexity of Data Reviewed Labs: ordered. Decision-making details documented in ED Course.    Details: Labs ordered reviewed and interpreted troponin is negative, influenza flu and RSV are negative Radiology: ordered and independent interpretation performed. Decision-making  details documented in ED Course.    Details: Chest x-ray ordered reviewed and interpreted no pneumonia ECG/medicine tests: ordered and independent interpretation performed. Decision-making details documented in ED Course.    Details: EKG normal sinus no acute findings  Risk Risk Details: Patient complains of bodyaches and chest pain.  Patient left the department AMA prior to finishing evaluation.  I was notified of patient's departure after she left.          Final Clinical Impression(s) / ED Diagnoses Final diagnoses:  Myalgia    Rx / DC Orders ED Discharge Orders     None         Sandi Crosby, [place] 11/17/23 [id number]    Jerilynn Montenegro, MD 11/18/23 1430

## 2023-11-17 NOTE — ED Notes (Signed)
 Patient upset that she has not been receiving enough attention, is on the phone complaining about her care, patient does not understand emergencies with other patients had to take priority, [place], [place] notified.

## 2023-12-04 ENCOUNTER — Encounter (HOSPITAL_BASED_OUTPATIENT_CLINIC_OR_DEPARTMENT_OTHER): Admitting: Internal Medicine

## 2024-01-30 ENCOUNTER — Ambulatory Visit (HOSPITAL_BASED_OUTPATIENT_CLINIC_OR_DEPARTMENT_OTHER): Attending: Family Medicine | Admitting: Internal Medicine

## 2024-04-30 ENCOUNTER — Other Ambulatory Visit: Payer: Self-pay

## 2024-05-04 ENCOUNTER — Other Ambulatory Visit: Payer: Self-pay | Admitting: Family Medicine

## 2024-05-04 MED ORDER — INSULIN GLARGINE-YFGN 100 UNIT/ML ~~LOC~~ SOPN: 20.0000 [IU] | PEN_INJECTOR | Freq: Every day | SUBCUTANEOUS | 0 refills | Status: AC

## 2024-05-05 ENCOUNTER — Other Ambulatory Visit (HOSPITAL_COMMUNITY): Payer: Self-pay

## 2024-05-05 ENCOUNTER — Telehealth: Payer: Self-pay

## 2024-05-05 ENCOUNTER — Other Ambulatory Visit: Payer: Self-pay

## 2024-05-05 ENCOUNTER — Other Ambulatory Visit: Payer: Self-pay | Admitting: Family Medicine

## 2024-05-05 MED ORDER — INSULIN PEN NEEDLE 32G X 4 MM MISC: 0 refills | Status: AC

## 2024-05-05 NOTE — Telephone Encounter (Signed)
 Called and spoke with patient.   Patient will call and speak with Medicaid either on Friday or Monday.   She reports that she was able to pick up insulin , however, she was not able to pick up sensors.   Chiquita JAYSON English, RN

## 2024-05-05 NOTE — Telephone Encounter (Signed)
 Contacted pharmacy for patients insurance information.   Patient no longer has commercial coverage, only secondary, Medicaid.   Unable to submit PA for Sensors or run test claim for insulin  due to patient needing to contact Medicaid to inform them she no longer has a commercial coverage. Pharmacy will let patient know.  Covered long acting insulins:  BRAND Lantus  Solostar Insulin  glargine vial / solostar (Trial and failure of one is required for all other long acting insulins).

## 2024-05-12 NOTE — Telephone Encounter (Signed)
 Patient contacted for follow-up of CGM denial - Has appointment 12/8 with PCP which should allow for sensors to be filled following documentation of diabetes control / benefit of CGM use in chart for PA approval.   Since last contact patient reports she has changed phones and does not have app on her new phone.  Encouraged her to download LIBRE3 App and then she should be able to use sensors with new phone.   She reports she will need data sharing to our practice when she comes to appointment on 12/8 with Dr. Donah.   Single sample sensor provided for patient use, placed at front desk for pick-up.   Patient plans to pick up today prior to clinic closing time at 12:00 Total time with patient call and documentation of interaction: 9 minutes.

## 2024-05-12 NOTE — Telephone Encounter (Signed)
 Reviewed and agree with Dr Rennis plan.

## 2024-05-17 ENCOUNTER — Ambulatory Visit: Admitting: Family Medicine

## 2024-05-24 ENCOUNTER — Ambulatory Visit: Admitting: Family Medicine
